# Patient Record
Sex: Female | Born: 1950
Health system: Southern US, Community
[De-identification: ages and names within clinical notes are randomized; demographics above are authoritative.]

## PROBLEM LIST (undated history)

## (undated) DIAGNOSIS — Z87898 Personal history of other specified conditions: Secondary | ICD-10-CM

## (undated) DIAGNOSIS — I509 Heart failure, unspecified: Secondary | ICD-10-CM

## (undated) DIAGNOSIS — N189 Chronic kidney disease, unspecified: Secondary | ICD-10-CM

## (undated) DIAGNOSIS — M1990 Unspecified osteoarthritis, unspecified site: Secondary | ICD-10-CM

## (undated) DIAGNOSIS — R918 Other nonspecific abnormal finding of lung field: Secondary | ICD-10-CM

## (undated) DIAGNOSIS — H9193 Unspecified hearing loss, bilateral: Secondary | ICD-10-CM

## (undated) DIAGNOSIS — I10 Essential (primary) hypertension: Secondary | ICD-10-CM

## (undated) DIAGNOSIS — M797 Fibromyalgia: Secondary | ICD-10-CM

## (undated) DIAGNOSIS — G43909 Migraine, unspecified, not intractable, without status migrainosus: Secondary | ICD-10-CM

## (undated) DIAGNOSIS — T7840XA Allergy, unspecified, initial encounter: Secondary | ICD-10-CM

## (undated) HISTORY — PX: BREAST CYST ASPIRATION: SHX578

## (undated) HISTORY — PX: OTHER SURGICAL HISTORY: SHX169

## (undated) HISTORY — DX: Heart failure, unspecified: I50.9

## (undated) HISTORY — DX: Essential (primary) hypertension: I10

## (undated) HISTORY — DX: Chronic kidney disease, unspecified: N18.9

## (undated) HISTORY — DX: Unspecified hearing loss, bilateral: H91.93

## (undated) HISTORY — DX: Allergy, unspecified, initial encounter: T78.40XA

## (undated) HISTORY — DX: Fibromyalgia: M79.7

## (undated) HISTORY — DX: Personal history of other specified conditions: Z87.898

## (undated) HISTORY — DX: Migraine, unspecified, not intractable, without status migrainosus: G43.909

## (undated) HISTORY — PX: COLONOSCOPY: SHX174

## (undated) HISTORY — PX: CATARACT EXTRACTION: SUR2

## (undated) HISTORY — DX: Other nonspecific abnormal finding of lung field: R91.8

## (undated) HISTORY — PX: TUBAL LIGATION: SHX77

## (undated) HISTORY — PX: TONSILLECTOMY: SUR1361

## (undated) HISTORY — DX: Unspecified osteoarthritis, unspecified site: M19.90

---

## 1971-06-25 HISTORY — PX: BREAST SURGERY: SHX581

## 1984-06-24 HISTORY — PX: BACK SURGERY: SHX140

## 1997-04-24 HISTORY — PX: HYSTEROSCOPY: SHX211

## 1997-09-30 ENCOUNTER — Ambulatory Visit (HOSPITAL_COMMUNITY): Admission: RE | Admit: 1997-09-30 | Discharge: 1997-09-30 | Payer: Self-pay | Admitting: Obstetrics and Gynecology

## 1997-10-13 ENCOUNTER — Other Ambulatory Visit: Admission: RE | Admit: 1997-10-13 | Discharge: 1997-10-13 | Payer: Self-pay | Admitting: Obstetrics and Gynecology

## 1997-11-11 ENCOUNTER — Ambulatory Visit (HOSPITAL_COMMUNITY): Admission: RE | Admit: 1997-11-11 | Discharge: 1997-11-11 | Payer: Self-pay | Admitting: *Deleted

## 1998-07-03 ENCOUNTER — Other Ambulatory Visit: Admission: RE | Admit: 1998-07-03 | Discharge: 1998-07-03 | Payer: Self-pay | Admitting: Obstetrics and Gynecology

## 1998-07-16 ENCOUNTER — Emergency Department (HOSPITAL_COMMUNITY): Admission: EM | Admit: 1998-07-16 | Discharge: 1998-07-16 | Payer: Self-pay | Admitting: Emergency Medicine

## 1998-12-04 ENCOUNTER — Encounter: Admission: RE | Admit: 1998-12-04 | Discharge: 1999-01-11 | Payer: Self-pay | Admitting: Neurology

## 1999-04-16 ENCOUNTER — Other Ambulatory Visit: Admission: RE | Admit: 1999-04-16 | Discharge: 1999-04-16 | Payer: Self-pay | Admitting: Obstetrics and Gynecology

## 1999-05-01 ENCOUNTER — Ambulatory Visit (HOSPITAL_COMMUNITY): Admission: RE | Admit: 1999-05-01 | Discharge: 1999-05-01 | Payer: Self-pay | Admitting: Obstetrics and Gynecology

## 1999-05-01 ENCOUNTER — Encounter: Payer: Self-pay | Admitting: Obstetrics and Gynecology

## 1999-05-03 ENCOUNTER — Ambulatory Visit (HOSPITAL_COMMUNITY): Admission: RE | Admit: 1999-05-03 | Discharge: 1999-05-03 | Payer: Self-pay | Admitting: Obstetrics and Gynecology

## 1999-05-03 ENCOUNTER — Encounter: Payer: Self-pay | Admitting: Obstetrics and Gynecology

## 2000-05-23 ENCOUNTER — Encounter: Payer: Self-pay | Admitting: Internal Medicine

## 2000-05-23 ENCOUNTER — Ambulatory Visit (HOSPITAL_COMMUNITY): Admission: RE | Admit: 2000-05-23 | Discharge: 2000-05-23 | Payer: Self-pay | Admitting: Internal Medicine

## 2000-08-25 ENCOUNTER — Other Ambulatory Visit: Admission: RE | Admit: 2000-08-25 | Discharge: 2000-08-25 | Payer: Self-pay | Admitting: Obstetrics and Gynecology

## 2001-11-06 ENCOUNTER — Encounter: Payer: Self-pay | Admitting: Obstetrics and Gynecology

## 2001-11-06 ENCOUNTER — Ambulatory Visit (HOSPITAL_COMMUNITY): Admission: RE | Admit: 2001-11-06 | Discharge: 2001-11-06 | Payer: Self-pay | Admitting: Obstetrics and Gynecology

## 2002-03-12 ENCOUNTER — Other Ambulatory Visit: Admission: RE | Admit: 2002-03-12 | Discharge: 2002-03-12 | Payer: Self-pay | Admitting: Obstetrics and Gynecology

## 2002-10-26 ENCOUNTER — Encounter: Admission: RE | Admit: 2002-10-26 | Discharge: 2002-10-26 | Payer: Self-pay | Admitting: Internal Medicine

## 2002-10-26 ENCOUNTER — Encounter: Payer: Self-pay | Admitting: Internal Medicine

## 2003-06-02 ENCOUNTER — Other Ambulatory Visit: Admission: RE | Admit: 2003-06-02 | Discharge: 2003-06-02 | Payer: Self-pay | Admitting: Obstetrics and Gynecology

## 2003-06-10 ENCOUNTER — Ambulatory Visit (HOSPITAL_COMMUNITY): Admission: RE | Admit: 2003-06-10 | Discharge: 2003-06-10 | Payer: Self-pay | Admitting: Obstetrics and Gynecology

## 2003-07-22 ENCOUNTER — Ambulatory Visit (HOSPITAL_COMMUNITY): Admission: RE | Admit: 2003-07-22 | Discharge: 2003-07-22 | Payer: Self-pay | Admitting: Internal Medicine

## 2004-07-13 ENCOUNTER — Other Ambulatory Visit: Admission: RE | Admit: 2004-07-13 | Discharge: 2004-07-13 | Payer: Self-pay | Admitting: Obstetrics and Gynecology

## 2004-08-08 ENCOUNTER — Ambulatory Visit (HOSPITAL_COMMUNITY): Admission: RE | Admit: 2004-08-08 | Discharge: 2004-08-08 | Payer: Self-pay | Admitting: Obstetrics and Gynecology

## 2005-01-22 ENCOUNTER — Inpatient Hospital Stay (HOSPITAL_COMMUNITY): Admission: EM | Admit: 2005-01-22 | Discharge: 2005-01-24 | Payer: Self-pay | Admitting: Emergency Medicine

## 2005-01-23 ENCOUNTER — Ambulatory Visit: Payer: Self-pay | Admitting: Cardiovascular Disease

## 2005-01-23 ENCOUNTER — Encounter: Payer: Self-pay | Admitting: Cardiovascular Disease

## 2005-01-23 ENCOUNTER — Ambulatory Visit: Payer: Self-pay | Admitting: Cardiology

## 2005-01-31 ENCOUNTER — Ambulatory Visit: Payer: Self-pay

## 2005-02-14 ENCOUNTER — Ambulatory Visit: Payer: Self-pay | Admitting: Cardiology

## 2005-02-28 ENCOUNTER — Ambulatory Visit: Payer: Self-pay | Admitting: Cardiology

## 2005-03-08 ENCOUNTER — Ambulatory Visit: Payer: Self-pay | Admitting: Pulmonary Disease

## 2005-03-21 ENCOUNTER — Ambulatory Visit: Payer: Self-pay | Admitting: Cardiology

## 2005-05-01 ENCOUNTER — Ambulatory Visit: Payer: Self-pay | Admitting: Cardiology

## 2005-05-06 ENCOUNTER — Ambulatory Visit (HOSPITAL_COMMUNITY): Admission: RE | Admit: 2005-05-06 | Discharge: 2005-05-06 | Payer: Self-pay | Admitting: Internal Medicine

## 2005-07-01 ENCOUNTER — Ambulatory Visit: Payer: Self-pay | Admitting: Cardiology

## 2005-09-02 ENCOUNTER — Ambulatory Visit: Payer: Self-pay | Admitting: Cardiology

## 2005-09-02 ENCOUNTER — Ambulatory Visit (HOSPITAL_COMMUNITY): Admission: RE | Admit: 2005-09-02 | Discharge: 2005-09-02 | Payer: Self-pay | Admitting: Internal Medicine

## 2005-09-02 ENCOUNTER — Ambulatory Visit: Payer: Self-pay

## 2005-09-02 ENCOUNTER — Encounter: Payer: Self-pay | Admitting: Internal Medicine

## 2005-09-19 ENCOUNTER — Ambulatory Visit: Payer: Self-pay | Admitting: Internal Medicine

## 2005-09-19 ENCOUNTER — Ambulatory Visit: Payer: Self-pay | Admitting: Cardiology

## 2005-10-01 ENCOUNTER — Ambulatory Visit: Payer: Self-pay | Admitting: Cardiology

## 2005-10-18 ENCOUNTER — Ambulatory Visit: Payer: Self-pay | Admitting: Cardiology

## 2005-10-28 ENCOUNTER — Ambulatory Visit: Payer: Self-pay | Admitting: Internal Medicine

## 2005-11-14 ENCOUNTER — Ambulatory Visit (HOSPITAL_COMMUNITY): Admission: RE | Admit: 2005-11-14 | Discharge: 2005-11-14 | Payer: Self-pay | Admitting: Internal Medicine

## 2005-11-29 ENCOUNTER — Ambulatory Visit: Payer: Self-pay | Admitting: Internal Medicine

## 2005-12-13 ENCOUNTER — Ambulatory Visit: Payer: Self-pay | Admitting: Cardiology

## 2005-12-26 ENCOUNTER — Ambulatory Visit: Payer: Self-pay | Admitting: Pulmonary Disease

## 2006-01-13 ENCOUNTER — Ambulatory Visit: Payer: Self-pay | Admitting: Internal Medicine

## 2006-01-17 ENCOUNTER — Other Ambulatory Visit: Admission: RE | Admit: 2006-01-17 | Discharge: 2006-01-17 | Payer: Self-pay | Admitting: Obstetrics & Gynecology

## 2006-02-14 ENCOUNTER — Ambulatory Visit: Payer: Self-pay

## 2006-02-14 ENCOUNTER — Encounter: Payer: Self-pay | Admitting: Cardiology

## 2006-03-03 ENCOUNTER — Ambulatory Visit (HOSPITAL_COMMUNITY): Admission: RE | Admit: 2006-03-03 | Discharge: 2006-03-03 | Payer: Self-pay | Admitting: Internal Medicine

## 2006-03-14 ENCOUNTER — Ambulatory Visit: Payer: Self-pay | Admitting: Internal Medicine

## 2006-03-17 ENCOUNTER — Ambulatory Visit: Payer: Self-pay | Admitting: Pulmonary Disease

## 2006-04-15 ENCOUNTER — Ambulatory Visit: Payer: Self-pay | Admitting: Internal Medicine

## 2006-05-08 ENCOUNTER — Ambulatory Visit (HOSPITAL_COMMUNITY): Admission: RE | Admit: 2006-05-08 | Discharge: 2006-05-08 | Payer: Self-pay | Admitting: Internal Medicine

## 2006-05-08 ENCOUNTER — Ambulatory Visit: Payer: Self-pay | Admitting: Internal Medicine

## 2006-05-30 ENCOUNTER — Ambulatory Visit: Payer: Self-pay | Admitting: Internal Medicine

## 2006-06-06 ENCOUNTER — Ambulatory Visit: Payer: Self-pay

## 2006-07-08 ENCOUNTER — Ambulatory Visit: Payer: Self-pay | Admitting: Cardiology

## 2006-09-02 ENCOUNTER — Ambulatory Visit: Payer: Self-pay | Admitting: Cardiology

## 2006-11-14 ENCOUNTER — Ambulatory Visit: Payer: Self-pay | Admitting: Internal Medicine

## 2006-12-30 ENCOUNTER — Ambulatory Visit: Payer: Self-pay | Admitting: Cardiology

## 2006-12-30 LAB — CONVERTED CEMR LAB
CO2: 35 meq/L — ABNORMAL HIGH (ref 19–32)
Calcium: 9.4 mg/dL (ref 8.4–10.5)
Glucose, Bld: 109 mg/dL — ABNORMAL HIGH (ref 70–99)
Potassium: 3.8 meq/L (ref 3.5–5.1)
Pro B Natriuretic peptide (BNP): 22 pg/mL (ref 0.0–100.0)

## 2007-02-20 ENCOUNTER — Ambulatory Visit: Payer: Self-pay | Admitting: Internal Medicine

## 2007-03-20 ENCOUNTER — Ambulatory Visit: Payer: Self-pay | Admitting: Cardiovascular Disease

## 2007-05-07 ENCOUNTER — Other Ambulatory Visit: Admission: RE | Admit: 2007-05-07 | Discharge: 2007-05-07 | Payer: Self-pay | Admitting: Obstetrics & Gynecology

## 2007-05-22 ENCOUNTER — Ambulatory Visit (HOSPITAL_COMMUNITY): Admission: RE | Admit: 2007-05-22 | Discharge: 2007-05-22 | Payer: Self-pay | Admitting: Obstetrics & Gynecology

## 2007-08-10 ENCOUNTER — Ambulatory Visit: Payer: Self-pay | Admitting: Cardiology

## 2007-10-09 ENCOUNTER — Ambulatory Visit: Payer: Self-pay

## 2007-10-09 ENCOUNTER — Encounter: Payer: Self-pay | Admitting: Internal Medicine

## 2007-10-09 ENCOUNTER — Ambulatory Visit: Payer: Self-pay | Admitting: Internal Medicine

## 2008-01-19 ENCOUNTER — Encounter: Payer: Self-pay | Admitting: Internal Medicine

## 2008-01-19 ENCOUNTER — Ambulatory Visit: Payer: Self-pay

## 2008-01-19 ENCOUNTER — Ambulatory Visit: Payer: Self-pay | Admitting: Internal Medicine

## 2008-07-22 ENCOUNTER — Ambulatory Visit (HOSPITAL_COMMUNITY): Admission: RE | Admit: 2008-07-22 | Discharge: 2008-07-22 | Payer: Self-pay | Admitting: Internal Medicine

## 2008-08-11 ENCOUNTER — Ambulatory Visit: Payer: Self-pay | Admitting: Internal Medicine

## 2008-09-02 ENCOUNTER — Ambulatory Visit: Payer: Self-pay

## 2008-09-02 ENCOUNTER — Ambulatory Visit: Payer: Self-pay | Admitting: Internal Medicine

## 2008-09-02 ENCOUNTER — Encounter: Payer: Self-pay | Admitting: Internal Medicine

## 2008-10-21 ENCOUNTER — Encounter: Payer: Self-pay | Admitting: Internal Medicine

## 2008-10-21 LAB — CONVERTED CEMR LAB
BUN: 15 mg/dL
Creatinine, Ser: 0.8 mg/dL
HDL: 49 mg/dL
LDL Cholesterol: 207 mg/dL

## 2008-11-23 ENCOUNTER — Encounter: Payer: Self-pay | Admitting: Internal Medicine

## 2009-04-05 ENCOUNTER — Encounter (INDEPENDENT_AMBULATORY_CARE_PROVIDER_SITE_OTHER): Payer: Self-pay | Admitting: *Deleted

## 2009-07-07 ENCOUNTER — Ambulatory Visit: Payer: Self-pay | Admitting: Internal Medicine

## 2009-07-07 DIAGNOSIS — R5383 Other fatigue: Secondary | ICD-10-CM

## 2009-07-07 DIAGNOSIS — R5381 Other malaise: Secondary | ICD-10-CM | POA: Insufficient documentation

## 2009-07-07 DIAGNOSIS — I429 Cardiomyopathy, unspecified: Secondary | ICD-10-CM

## 2010-01-12 ENCOUNTER — Ambulatory Visit (HOSPITAL_COMMUNITY): Admission: RE | Admit: 2010-01-12 | Discharge: 2010-01-12 | Payer: Self-pay | Admitting: Internal Medicine

## 2010-03-09 ENCOUNTER — Ambulatory Visit: Payer: Self-pay | Admitting: Internal Medicine

## 2010-03-09 DIAGNOSIS — I10 Essential (primary) hypertension: Secondary | ICD-10-CM

## 2010-04-06 ENCOUNTER — Ambulatory Visit: Payer: Self-pay | Admitting: Cardiology

## 2010-04-06 ENCOUNTER — Ambulatory Visit (HOSPITAL_COMMUNITY): Admission: RE | Admit: 2010-04-06 | Discharge: 2010-04-06 | Payer: Self-pay | Admitting: Internal Medicine

## 2010-04-06 ENCOUNTER — Encounter: Payer: Self-pay | Admitting: Internal Medicine

## 2010-04-06 ENCOUNTER — Ambulatory Visit: Payer: Self-pay

## 2010-04-13 ENCOUNTER — Telehealth: Payer: Self-pay | Admitting: Internal Medicine

## 2010-07-24 NOTE — Progress Notes (Signed)
Summary: pt rtn call for results   Phone Note Call from Patient Call back at Work Phone [phone]   Caller: Patient Reason for Call: Talk to Nurse, Talk to Doctor, Lab or Test Results Summary of Call: rtn call to get test results Initial call taken by: Omer Jack,  April 13, 2010 1:39 PM  Follow-up for Phone Call        pt given test results Meredith Staggers, RN  April 13, 2010 3:19 PM

## 2010-07-24 NOTE — Assessment & Plan Note (Signed)
Summary: f44m  Medications Added PRAVASTATIN SODIUM 20 MG TABS (PRAVASTATIN SODIUM) Take one tablet by mouth daily at bedtime        Visit Type:  Follow-up Primary Provider:  Waynard Edwards, MD   History of Present Illness: [name] [name] is a very pleasant 60 year old woman with a history of hypertension, ongoing tobacco use, fibromyalgia, hearing loss, nonischemic cardiomyopathy with a previous ejection fraction of 25%-35%; however, this has recovered and most recent ejection fraction by ech 09/2007 was 60%.  She returns today for routine followup.   She says she has been doing pretty well. Continues to bowl regularly and work full time without any dyspnea.  She has not had any orthopnea and no PND.  She has been very compliant with her heart failure medications. Occasional very mild edema and will take an extra lasix tablet.   Current Medications (verified): 1)  Ramipril 10 Mg Caps (Ramipril) .... Take One Capsule By Mouth Two Times A Day 2)  Amlodipine Besylate 5 Mg Tabs (Amlodipine Besylate) .... Take One Tablet By Mouth Daily 3)  Furosemide 20 Mg Tabs (Furosemide) .... Take 1/2 Tablet By Mouth Daily and As Needed 4)  Carvedilol 25 Mg Tabs (Carvedilol) .... Take One Tablet By Mouth Twice A Day 5)  Aspirin 81 Mg Tbec (Aspirin) .... Take One Tablet By Mouth Daily 6)  Gabapentin 300 Mg Caps (Gabapentin) .... Once Daily 7)  Potassium Chloride Crys Cr 20 Meq Cr-Tabs (Potassium Chloride Crys Cr) .... Take One Tablet By Mouth Every Other Day 8)  Flexeril 10 Mg Tabs (Cyclobenzaprine Hcl) .... Once Daily 9)  Ambien 5 Mg Tabs (Zolpidem Tartrate) .... As Needed 10)  Imitrex 25 Mg Tabs (Sumatriptan Succinate) .... As Needed 11)  Hydrocodone-Acetaminophen 5-500 Mg Tabs (Hydrocodone-Acetaminophen) .... As Needed 12)  Phenergan 25 Mg/ml Soln (Promethazine Hcl) .... As Needed 13)  Boniva 150 Mg Tabs (Ibandronate Sodium) .... Monthly 14)  Calcium Carbonate-Vitamin D 600-400 Mg-Unit  Tabs (Calcium Carbonate-Vitamin D)  .... Two Times A Day 15)  Magonate 1000 (54 Mg) Mg/72ml Liqd (Magnesium Gluconate) .... Two Times A Day 16)  Co Q-10 30 Mg  Caps (Coenzyme Q10) .... 100mg  Two Times A Day 17)  Vitamin B-1 250 Mg  Tabs (Thiamine Hcl) .... Once Daily 18)  Vitamin E 600 Unit  Caps (Vitamin E) .... Once Daily 19)  Manganese Gluconate 50 Mg Tabs (Manganese Gluconate) .... Once Daily 20)  Vitamin D 1000 Unit  Tabs (Cholecalciferol) .... Once Daily 21)  Fish Oil   Oil (Fish Oil) .... 2000mg  Two Times A Day 22)  Silymarin  Caps (Milk Thistle-Turmeric) .... Once Daily 23)  Multivitamins   Tabs (Multiple Vitamin) .... Once Daily 24)  Pravastatin Sodium 20 Mg Tabs (Pravastatin Sodium) .... Take One Tablet By Mouth Daily At Bedtime  Allergies: 1)  ! Pcn 2)  ! Lexapro  Past History:  Past Medical History: 1. Congestive heart failure secondary to nonischemic cardiomyopathy       status post cardiac catheterization in 2006 showed normal       coronaries with EF 20%.      --echo 4/09: EF 60% 2. Hypertension, this is followed by Dr. Waynard Edwards 3. Fibromyalgia 4. Chronic migraines 5. left upper lobe nodular lesion followed by Dr. Shelle Iron 6. Hearing loss which is hereditary 7. Post menopausal since age 84 8. Heart murmur first diagnosed in November 2001 9. Osteopenia 10. Fibromyalgia in April 2004  Review of Systems       As per HPI and past  medical history; otherwise all systems negative.   Vital Signs:  Patient profile:   60 year old female Height:      60 inches Weight:      120 pounds Pulse rate:   74 / minute BP sitting:   122 / 70  (left arm)  Vitals Entered By: Laurance Flatten CMA (March 09, 2010 10:02 AM)  Physical Exam  General:  Well appearing. no resp difficulty HEENT: normal Neck: supple. no JVD. Carotids 2+ bilat; no bruits. No lymphadenopathy or thryomegaly appreciated. Cor: PMI nondisplaced. Regular rate & rhythm. No rubs, gallops, murmur. Lungs: clear Abdomen: soft, nontender,  nondistended. Good bowel sounds. Extremities: no cyanosis, clubbing, rash, edema Neuro: alert & orientedx3, cranial nerves grossly intact. moves all 4 extremities w/o difficulty. affect pleasant    Impression & Recommendations:  Problem # 1:  CARDIOMYOPATHY, PRIMARY, DILATED (ICD-425.4) Doing well. EF recovered. Given HTN will continue medicine regimen as is. Check echo to ensure stability of LV function. Retunr in 1 year.   Problem # 2:  HYPERTENSION, BENIGN (ICD-401.1) Blood pressure well controlled. Continue current regimen.  Other Orders: EKG w/ Interpretation (93000) Echocardiogram (Echo)  Patient Instructions: 1)  Your physician recommends that you continue on your current medications as directed. Please refer to the Current Medication list given to you today. 2)  Your physician wants you to follow-up in: 1 year.  You will receive a reminder letter in the mail two months in advance. If you don't receive a letter, please call our office to schedule the follow-up appointment. 3)  Your physician has requested that you have an echocardiogram in 1 month.  Echocardiography is a painless test that uses sound waves to create images of your heart. It provides your doctor with information about the size and shape of your heart and how well your heart's chambers and valves are working.  This procedure takes approximately one hour. There are no restrictions for this procedure.

## 2010-07-24 NOTE — Assessment & Plan Note (Signed)
Summary: ROV  Medications Added FUROSEMIDE 20 MG TABS (FUROSEMIDE) Take 1/2 tablet by mouth daily and as needed ASPIRIN 81 MG TBEC (ASPIRIN) Take one tablet by mouth daily GABAPENTIN 300 MG CAPS (GABAPENTIN) once daily POTASSIUM CHLORIDE CRYS CR 20 MEQ CR-TABS (POTASSIUM CHLORIDE CRYS CR) Take one tablet by mouth every other day FLEXERIL 10 MG TABS (CYCLOBENZAPRINE HCL) once daily AMBIEN 5 MG TABS (ZOLPIDEM TARTRATE) as needed IMITREX 25 MG TABS (SUMATRIPTAN SUCCINATE) as needed HYDROCODONE-ACETAMINOPHEN 5-500 MG TABS (HYDROCODONE-ACETAMINOPHEN) as needed PHENERGAN 25 MG/ML SOLN (PROMETHAZINE HCL) as needed BONIVA 150 MG TABS (IBANDRONATE SODIUM) monthly CALCIUM CARBONATE-VITAMIN D 600-400 MG-UNIT  TABS (CALCIUM CARBONATE-VITAMIN D) two times a day MAGONATE 1000 (54 MG) MG/5ML LIQD (MAGNESIUM GLUCONATE) two times a day CO Q-10 30 MG  CAPS (COENZYME Q10) 100mg  two times a day VITAMIN B-1 250 MG  TABS (THIAMINE HCL) once daily VITAMIN E 600 UNIT  CAPS (VITAMIN E) once daily MANGANESE GLUCONATE 50 MG TABS (MANGANESE GLUCONATE) once daily VITAMIN D 1000 UNIT  TABS (CHOLECALCIFEROL) once daily FISH OIL   OIL (FISH OIL) 2000mg  two times a day SILYMARIN  CAPS (MILK THISTLE-TURMERIC) once daily MULTIVITAMINS   TABS (MULTIPLE VITAMIN) once daily      Allergies Added: ! PCN ! LEXAPRO  CC:  rov .  History of Present Illness: Corry is a very pleasant 60 year old woman with a history of hypertension, ongoing tobacco use, fibromyalgia, hearing loss, nonischemic cardiomyopathy with a previous ejection fraction of 25%-35%; however, this has recovered and most recent ejection fraction several months ago was 55%.  She returns today for routine followup.   She says she has been doing pretty. Still has a lot of fatigue and wonders if it is due to her midications. However she is able to do all her activities without much difficulty. Continues to bowl regularly and work full time without any dyspnea.  She  has not had any orthopnea and no PND.  She has been very compliant with her heart failure medications.  Current Medications (verified): 1)  Ramipril 10 Mg Caps (Ramipril) .... Take One Capsule By Mouth Two Times A Day 2)  Amlodipine Besylate 5 Mg Tabs (Amlodipine Besylate) .... Take One Tablet By Mouth Daily 3)  Furosemide 20 Mg Tabs (Furosemide) .... Take 1/2 Tablet By Mouth Daily and As Needed 4)  Carvedilol 25 Mg Tabs (Carvedilol) .... Take One Tablet By Mouth Twice A Day 5)  Aspirin 81 Mg Tbec (Aspirin) .... Take One Tablet By Mouth Daily 6)  Gabapentin 300 Mg Caps (Gabapentin) .... Once Daily 7)  Potassium Chloride Crys Cr 20 Meq Cr-Tabs (Potassium Chloride Crys Cr) .... Take One Tablet By Mouth Every Other Day 8)  Flexeril 10 Mg Tabs (Cyclobenzaprine Hcl) .... Once Daily 9)  Ambien 5 Mg Tabs (Zolpidem Tartrate) .... As Needed 10)  Imitrex 25 Mg Tabs (Sumatriptan Succinate) .... As Needed 11)  Hydrocodone-Acetaminophen 5-500 Mg Tabs (Hydrocodone-Acetaminophen) .... As Needed 12)  Phenergan 25 Mg/ml Soln (Promethazine Hcl) .... As Needed 13)  Boniva 150 Mg Tabs (Ibandronate Sodium) .... Monthly 14)  Calcium Carbonate-Vitamin D 600-400 Mg-Unit  Tabs (Calcium Carbonate-Vitamin D) .... Two Times A Day 15)  Magonate 1000 (54 Mg) Mg/70ml Liqd (Magnesium Gluconate) .... Two Times A Day 16)  Co Q-10 30 Mg  Caps (Coenzyme Q10) .... 100mg  Two Times A Day 17)  Vitamin B-1 250 Mg  Tabs (Thiamine Hcl) .... Once Daily 18)  Vitamin E 600 Unit  Caps (Vitamin E) .... Once Daily 19)  Manganese Gluconate 50 Mg Tabs (Manganese Gluconate) .... Once Daily 20)  Vitamin D 1000 Unit  Tabs (Cholecalciferol) .... Once Daily 21)  Fish Oil   Oil (Fish Oil) .... 2000mg  Two Times A Day 22)  Silymarin  Caps (Milk Thistle-Turmeric) .... Once Daily 23)  Multivitamins   Tabs (Multiple Vitamin) .... Once Daily  Allergies (verified): 1)  ! Pcn 2)  ! Lexapro  Past History:  Past Medical History: Last updated:  08/05/2008 1. Congestive heart failure secondary to nonischemic cardiomyopathy       status post cardiac catheterization in 2006 showed normal       coronaries with EF 20%.  2. Hypertension, this is followed by Dr. Waynard Edwards 3. Fibromyalgia 4. Chronic migraines 5. left upper lobe nodular lesion followed by Dr. Shelle Iron 6. Hearing loss which is hereditary 7. Post menopausal since age 30 8. Heart murmur first diagnosed in November 2001 9. Osteopenia 10. Fibromyalgia in April 2004  Review of Systems       As per HPI and past medical history; otherwise all systems negative.   Vital Signs:  Patient profile:   60 year old female Height:      60 inches Weight:      125 pounds BMI:     24.50 Pulse rate:   66 / minute BP sitting:   122 / 76  (right arm) Cuff size:   regular  Vitals Entered By: Hardin Negus, RMA (July 07, 2009 9:11 AM)  Physical Exam  General:  Gen: well appearing. no resp difficulty HEENT: normal Neck: supple. no JVD. Carotids 2+ bilat; no bruits. No lymphadenopathy or thryomegaly appreciated. Cor: PMI nondisplaced. Regular rate & rhythm. No rubs, gallops, murmur. Lungs: clear Abdomen: soft, nontender, nondistended. Good bowel sounds. Extremities: no cyanosis, clubbing, rash, edema Neuro: alert & orientedx3, cranial nerves grossly intact. moves all 4 extremities w/o difficulty. affect pleasant    Impression & Recommendations:  Problem # 1:  CARDIOMYOPATHY, PRIMARY, DILATED (ICD-425.4) EF has normalized. No signs of HF. Volume status looks good. Doubt fatigue is due to HF but I have agreed to a trial od cutting her carvedilol back to 12.5 two times a day and see if this helps. Warned her not to go any lower than this. And also discussed possibility of recurrent LV dysfunction, though I think this risk is low.  Problem # 2:  FATIGUE / MALAISE (ICD-780.79) Likely multifactorial. May be due to other meds including narcotics and flexeril. As above, OK for trial of  decrreasing carvedilol.   Patient Instructions: 1)  Follow up in 6 months  Appended Document: ROV ECG with SR 66. TWI v1 and v2. with mild diffuse ST scooping

## 2010-11-06 NOTE — Assessment & Plan Note (Signed)
[facility]                          CHRONIC HEART FAILURE NOTE   NAME:[name] [name], [name] [name]                          MRN:          [id number]  DATE:08/10/2007                            DOB:          May 22, 1951    PRIMARY CARDIOLOGIST:  Dr. Nicholes Mango.   PRIMARY CARE:  Dr. Loraine Leriche A. Perini.   [name] [name] returns today for follow-up of her congestive heart failure which  is secondary to idiopathic nonischemic cardiomyopathy.  Most recent  echocardiogram done in 2007 showed EF of 25-35%.  Status post T-wave  alternans study since that time showed low probability.  Dr. Graciela Husbands  recommend annual T-wave testing at that time.  [name] [name] states she has been  doing quite well.  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  She denies any episodes of chest  discomfort, lightheadedness, dizziness, presyncope or syncopal episodes,  palpitations.  She states her husband just returned from the beach for  vacation.  She does states she occasionally forgets to take her Norvasc,  especially on the weekends.  However, she manages to state multiple  vitamin supplements regularly.  Today she is inquiring as to whether or  not she can try a turmeric spice in capsule form to help treat her  arthritis pain.   PAST MEDICAL HISTORY:  1. Congestive heart failure secondary to nonischemic cardiomyopathy      status post cardiac catheterization in 2006 showed normal      coronaries with EF 20%.  2. Status post T-wave alternans study which was negative in 2007.  3. Hypertension.  4. Ongoing tobacco use.  5. Fibromyalgia.  6. Chronic migraines.  7. History of back surgery.  8. Previous left upper lobe nodular lesion followed by Dr. Shelle Iron.      Likely benign given stable appearance in 2006 with recommendations      for 2-year follow-up.  9. Hearing loss which is hereditary.   REVIEW OF SYSTEMS:  As stated above, otherwise negative.   CURRENT  ALLERGIES:  1. Include SULFA DRUGS.  2. LEXAPRO which causes myalgias.  3. FOSAMAX causes GI upset.  4. PAXIL causes myalgias.  5. NSAID cause GI upset.  6. IMIPRAMINE increased nervousness.  7. STATINS myalgias.   CURRENT MEDICATIONS:  Include to acute Co-Q-10 b.i.d. vitamin E daily,  Lasix 10 mg daily.  Gabapentin 300 mg daily, vitamin D 1000 mg daily,  Flexeril 10 mg daily, calcium 600 mg b.i.d., magnesium malate 1000 mg  b.i.d., manganese 50 mg daily, B1 100 mg daily, K-Dur 20 mEq every other  day, aspirin 81 mg daily, Coreg 25 b.i.d., Altace 10 mg b.i.d.,  amlodipine 5 mg daily, Imitrex p.r.n., Zocor, the patient discontinued  this medication secondary to muscle fatigue.  Other p.r.n. medications  include Ambien, Benadryl, Vicodin, Phenergan and Alavert.   PHYSICAL EXAM:  Weight 125, blood pressure 118/79 with a heart rate of  78.  Rick is in no acute distress.  No signs of jugular vein distention at 45 degrees angle.  LUNGS:  Clear to auscultation bilaterally.  Cardiovascular  exam reveals S1-S2 regular rate and rhythm.  ABDOMEN:  Soft, nontender, positive bowel sounds.  LOWER EXTREMITIES:  Without clubbing, cyanosis or edema.  Neurologically alert and oriented x3.   IMPRESSION:  1. Congestive heart failure secondary to nonischemic cardiomyopathy      with EF recently 25-35% by echocardiogram in 2007 with a negative T-      wave alternans test.  The patient is due for an echocardiogram.      Will arrange this.  She declined blood work today states she has a      physical exam with Dr. Waynard Edwards in April and will have her blood work      drawn at that time.  Will ask that copy be faxed to me.  2. Smoking cessation once again encouraged.  Will have Oneda follow-up      with Dr. Gala Romney for routine cardiology visit after      echocardiogram obtained.  3. In regards to the turmeric capsule, I have spoken with Dr Shelby Dubin.  Currently no contraindications to use with heart  failure      medications but no data to support its use either with arthritis.      Dorian Pod, ACNP  Electronically Signed      Rollene Rotunda, MD, [facility]  Electronically Signed   MB/MedQ  DD: 08/10/2007  DT: 08/10/2007  Job #: [id number]   cc:   Loraine Leriche A. Perini, M.D.

## 2010-11-06 NOTE — Assessment & Plan Note (Signed)
[name]                            CARDIOLOGY OFFICE NOTE   NAME:Orwick, DAPHNE KARRER                          MRN:          [id number]  DATE:01/19/2008                            DOB:          05-11-1951    PRIMARY CARE PHYSICIAN:  Loraine Leriche A. Perini, MD   INTERVAL HISTORY:  Adelin is a very pleasant 60 year old woman with a  history of hypertension, ongoing tobacco use, fibromyalgia, hearing  loss, nonischemic cardiomyopathy with a previous ejection fraction of  25%-35%; however, this has recovered and most recent ejection fraction  several months ago was 55%.  She returns today for routine followup.   She says she has been feeling great.  She has taken to walking for 30  minutes 3-4 times a week without any dyspnea.  She has not had any  orthopnea and no PND.  She has been very compliant with her heart  failure medications, but she has been less compliant with her  amlodipine.  She says she often forgets to take this.   CURRENT MEDICATIONS:  1. Aspirin 81 a day.  2. Altace 10 b.i.d.  3. Coreg 25 b.i.d.  4. Gabapentin 300 a day.  5. Amlodipine 5 mg, which she takes occasionally.  6. Potassium 20 every other day.  7. Lasix 10 mg daily.  8. Flexeril 10 a day.  9. Crestor 10 mg 3 times a week.  10.Calcium.  11.Magnesium.  12.Coenzyme Q10.  13.Vitamin B1.  14.Vitamin E.  15.Manganese.  16.Vitamin D.  17.Fish oil.  18.Ambien p.r.n.  19.Imitrex p.r.n.   PHYSICAL EXAM:  GENERAL:  She is well-appearing in no acute distress.  Ambulates around the clinic without any  respiratory difficulty.  VITAL SIGNS:  Blood pressure is 154/74, heart rate 73, weight is 124,  which is stable.  HEENT:  Normal.  Neck is supple.  No JVD.  Carotid 2+ bilaterally, no  bruits.  There is no lymphadenopathy or thyromegaly.  CARDIAC:  PMI is nondisplaced.  Regular rate and rhythm.  No murmurs,  rubs, or gallops.  LUNGS:  Clear.  ABDOMEN:  Soft, nontender, and nondistended.   No hepatosplenomegaly.  No  bruits, no masses.  Good bowel sounds.  EXTREMITIES:  Warmth.  No sinus, clubbing, or edema.  No rash.  NEURO:  Alert and oriented x3.  Cranial nerves II through XII are intact  except for her hearing.  Moves all four extremities without difficulty.  Affect was mildly flattened.   EKG shows normal sinus rhythm with mildly flattened T-waves diffusely.  No significant ST-T wave abnormalities.   ASSESSMENT AND PLAN:  1. History of nonischemic cardiomyopathy.  Her ejection fraction is      recovered.  We will repeat her echocardiogram today just to make      sure this is stable.  She will continue her current medications.  2. Hypertension, this is followed by Dr. Waynard Edwards.  I have encouraged      her to be more compliant with her Norvasc.   DISPOSITION:  I will see her back for routine follow up in  6 months.     Bevelyn Buckles. Bensimhon, MD  Electronically Signed    DRB/MedQ  DD: 01/19/2008  DT: 01/20/2008  Job #: [id number]   cc:   Loraine Leriche A. Perini, M.D.

## 2010-11-06 NOTE — Assessment & Plan Note (Signed)
[facility]                          CHRONIC HEART FAILURE NOTE   NAME:[name] [name], [name] [name]                          MRN:          [id number]  DATE:11/14/2006                            DOB:          10/30/1950    PRIMARY CARE PHYSICIAN:  Dr. Rodrigo Ran.   PRIMARY CARDIOLOGIST:  Dr. Nicholes Mango.   PULMONARY:  Dr. Shelle Iron.   [name] [name] returns today for a followup of her congestive heart failure  which is secondary to idiopathic nonischemic cardiomyopathy, most recent  echocardiogram done in August of 2007 showed an EF of 25%-35%, status  post T-wave alternans test which was negative.  Dr. Graciela Husbands recommended  annual T-wave testing.  [name] [name] states she has been doing quite well.  Complains of intermittent dizziness.  No signs of volume overload.  Continues to work full time.  States her blood pressure has been running  130s over 70s at home.  Still taking Norvasc 5 mg daily.  I have  requested that patient increase it to 10 mg in the past, but patient  declines.  Currently is taking her Lasix 10 mg every other day secondary  to feelings of fatigue and of being washed out.   PAST MEDICAL HISTORY:  1. Includes congestive heart failure secondary to nonischemic      cardiomyopathy, status post cardiac cath in 2006 showed normal      coronaries with an EF of 20%.  2. Hypertension.  3. Ongoing tobacco use.  4. Fibromyalgia.  5. Chronic migraines.  6. Lung nodule followed by Dr. Shelle Iron.   REVIEW OF SYSTEMS:  As stated above.   ALLERGIES:  Include SULFA, LEXAPRO, FOSAMAX, PAXIL, NSAID and  IMIPRAMINE.   CURRENT MEDICATIONS:  1. Flexeril 10 mg daily.  2. Calcium.  3. Vitamin D.  4. Magnesium.  5. Manganese.  6. B1.  7. Furosemide 10 mg every other day.  8. Amlodipine 5 mg daily.  9. Imitrex p.r.n.  10.Zocor 20 mg daily.  11.Gabapentin 100 mg in the morning and 200 at bedtime.  12.Altace 10 mg daily.  13.Coreg 25 b.i.d.  14.Aspirin 81.  15.K-Dur 20  mEq with Lasix.   PHYSICAL EXAMINATION:  Weight 122 pounds, blood pressure 142/80,  rechecked 130/84 with a heart rate of 78.  [name] [name] is in no acute distress.  JVD 8-10 cm at a 45 degree angle.  LUNGS:  Clear to auscultation.  CARDIOVASCULAR EXAM:  Reveals an S1 and S2, regular rate and rhythm.  ABDOMEN:  Soft, nontender, positive bowel sounds.  LOWER EXTREMITIES:  Without cyanosis, clubbing, or edema.  Neurologically alert and oriented x3.   IMPRESSION:  Stable class II heart failure symptoms at this time.  The  patient does have some mild jugular venous distension, would like to  have her change her furosemide to 10 mg daily.  Otherwise, continue  current medications.  See patient back in 6-8 weeks, sooner if any  problems.      Dorian Pod, ACNP  Electronically Signed      Bevelyn Buckles. Bensimhon, MD  Electronically Signed   MB/MedQ  DD: 11/14/2006  DT: 11/14/2006  Job #: [id number]   cc:   Loraine Leriche A. Perini, M.D.

## 2010-11-06 NOTE — Assessment & Plan Note (Signed)
[facility]                          CHRONIC HEART FAILURE NOTE   NAME:[name] [name], [name] [name]                          MRN:          [id number]  DATE:02/20/2007                            DOB:          [birth year]    [name] [name] returns today for follow up of her congestive heart failure  which is secondary to etiopathic non-ischemic cardiomyopathy. Her most  recent echocardiogram in 2007 showed a EF of 25-35% status post T-wave  alternance test which was negative. Dr. Graciela Husbands recommended annual T-wave  testing. [name] states she has been doing quite well. 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 She  denies any episodes of chest discomfort, light headedness, dizziness,  pre-syncopal, or syncopal episodes. At her last visit, I decreased her  Lasix to 10 mg daily. She denies any symptoms of volume overload.   PAST MEDICAL HISTORY:  1. Congestive heart failure secondary to non-specific cardiomyopathy      status post cardiac catheterization in 2006 showing normal      coronaries with a EF of 20%.  2. Status post T-wave alternance test which was negative in 2007.  3. Hypertension.  4. Ongoing tobacco use.  5. Fibromyalgia.  6. Chronic migraines.  7. History of back surgery in 1986.  8. Previous left upper lobe nodular lesion followed by Dr. Shelle Iron,      likely benign given its stable appearance since 2006 with      recommendations for a two year follow up.   REVIEW OF SYSTEMS:  As stated above.   ALLERGIES:  SULFA, LEXAPRO, FOSAMAX, PAXIL, NSAIDs, and IMIPRAMINE.   CURRENT MEDICATIONS:  1. Flexeril 10 mg daily.  2. Calcium and vitamin D b.i.d.  3. Magnesium daily.  4. Manganese daily.  5. CoQ 10 daily.  6. Vitamin E daily.  7. B1 daily.  8. K-Dur 20 mEq p.r.n. with Lasix.  9. Aspirin 81 mg daily.  10.Coreg 25 mg b.i.d.  11.Altace 10 mg b.i.d.  12.Gabapentin 100 mg in the a.m. 300 mg in the p.m.  13.Amlodipine 5 mg  daily.  14.Imitrex p.r.n.  15.Furosemide 10 mg daily.  16.Ambien p.r.n.  17.Benadryl p.r.n.  18.Hydrocodone p.r.n.  19.Phenergan p.r.n.  20.Alavert p.r.n.   PHYSICAL EXAMINATION:  VITAL SIGNS:  Blood pressure 138/62, manual  122/72, weight 126, __________ 82.  GENERAL:  [name] is in no acute distress. She is her usual pleasant  self.  HEENT:  Unremarkable.  NECK:  Supple without lymphadenopathy. No bruits and no JVD.  LUNGS:  Clear to auscultation.  CARDIOVASCULAR:  S1, S2. Regular rate and rhythm.  ABDOMEN:  Soft and nontender, positive bowel sounds.  LOWER EXTREMITIES:  Without cyanosis, clubbing, or edema.  NEUROLOGIC:  Awake, alert, and oriented x3. Cranial nerves II-XII are  grossly intact.   IMPRESSION:  Congestive heart failure secondary to non-ischemic  cardiomyopathy with EF currently 25% to 35% status post T-wave  alternance test which was negative. The patient will be due for a repeat  T-wave test within the next few months. The patient  would like to be  called at home to arrange around her schedule and will continue current  medications. Blood pressure is well controlled. Once again, complete  smoking cessation encouraged. In review of lab work obtained in July,  potassium 3.8, BUN 12, and creatinine 0.9. Glucose is mildly elevated at  109, BNP 22. I instructed [name] [name] to follow up with Rodrigo Ran for further  evaluation of elevated glucose.      Dorian Pod, ACNP  Electronically Signed      Bevelyn Buckles. Bensimhon, MD  Electronically Signed   MB/MedQ  DD: 02/20/2007  DT: 02/22/2007  Job #: [id number]   cc:   Loraine Leriche A. Perini, M.D.

## 2010-11-06 NOTE — Assessment & Plan Note (Signed)
[facility]                          CHRONIC HEART FAILURE NOTE   NAME:[name] [name], [name] [name]                          MRN:          [id number]  DATE:12/30/2006                            DOB:          June 10, 1951    [name] [name] returns today for followup of her congestive heart failure which is  secondary to idiopathic nonischemic cardiomyopathy.  Most recent  echocardiogram in August 2007 showed an EF of 25-35%, status post T-wave  opportunist test which was negative.  Dr. Graciela Husbands recommending annual T-  wave testing.  We will arrange this at followup in August.  [name] [name] states  she has been doing quite well, denying any episodes of lightheadedness  or dizziness.  Medications without problems.  She has had some episodes  of atypical chest pain with some increased belching and reflux,  otherwise has done quite well.  I decreased her Lasix to 10 mg daily  recently.  She has not had any peripheral edema, orthopnea, or PND.   PAST MEDICAL HISTORY:  1. Congestive heart failure secondary to nonspecific cardiomyopathy      status post cardiac catheterization in 2006 showing normal      coronaries with a EF of 20%.  2. Hypertension.  3. Ongoing tobacco use.  4. Fibromyalgia.  5. Chronic migraines.   REVIEW OF SYSTEMS:  As stated above.   CURRENT MEDICATIONS:  1. Co-Q-10 b.i.d.  2. Vitamin E daily.  3. Flexeril.  4. Calcium.  5. Magnesium.  6. Manganese.  7. B1.  8. K-Dur 20 mEq every other day with Lasix.  9. Aspirin 81 mg daily.  10.Carvedilol 25 mg b.i.d.  11.Altace 10 mg b.i.d.  12.Gabapentin 1000 mg in a.m. 300 mg in p.m.  13.Amlodipine 5 mg daily.  14.Imitrex p.r.n.  15.Furosemide 10 mg every other day.  16.Simvastatin 20 mg daily.   PHYSICAL EXAMINATION:  VITAL SIGNS:  Weight 125, blood pressure 126/72  with a pulse of 79.  GENERAL:  [name] [name] is in no acute distress today.  HEENT:  Unremarkable, no JVD at 45-degree angle.  LUNGS:  Clear to auscultation.  CARDIOVASCULAR:  S1, S2, regular rate and rhythm.  ABDOMEN:  Soft, nontender, positive bowel sounds.  EXTREMITIES:  Lower extremities without cyanosis, clubbing, or edema.  NEUROLOGIC:  Alert and oriented x3.   IMPRESSION:  Stable class II heart failure symptoms at this time.  No  peripheral edema.  We will continue current medications.  We will check  lab work at this time, as she has not had any drawn here in a few months  and have the patient follow up in 2 months' time.  We will arrange for  repeat T-wave alternans test at that visit.      Dorian Pod, ACNP  Electronically Signed      Rollene Rotunda, MD, [facility]  Electronically Signed   MB/MedQ  DD: 12/30/2006  DT: 12/30/2006  Job #: [id number]   cc:   Loraine Leriche A. Perini, M.D.

## 2010-11-06 NOTE — Assessment & Plan Note (Signed)
[facility]                            CARDIOLOGY OFFICE NOTE   NAME:[name], [name] DEJARNETT                          MRN:          [id number]  DATE:08/11/2008                            DOB:          08/14/1950    PRIMARY CARE PHYSICIAN:  Loraine Leriche A. Perini, MD   INTERVAL HISTORY:  [name] [name] is a 60 year old woman with a history of  hypertension, fibromyalgia, hearing loss, nonischemic cardiomyopathy  with previous ejection fraction of 25-35% which has recovered and now on  most recent echocardiogram in July 2009, shows an EF of 50-55%.   She returns today for routine followup.  She is having a very difficult  time as her 32 year old mother has been sick in the hospital and now in  a nursing home with a venous ulcer and other complications.  She says  that this has been very stressful for her; however, otherwise she is  doing fairly well.  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  She denies any chest pain or dyspnea.  No orthopnea,  no PND.  No lower extremity edema.  She has been compliant with all her  medications.  She is wondering if she can get also off her medications.   CURRENT MEDICATIONS:  1. Aspirin 81 a day.  2. Altace 10 b.i.d.  3. Coreg 25 b.i.d.  4. Neurontin 300 a day.  5. Amlodipine 5 a day.  6. Potassium 20 every other day.  7. Lasix 10 mg daily.  8. Flexeril 100 a day.  9. Calcium.  10.Magnesium.  11.Coenzyme Q10.  12.Vitamin B1, vitamin E, vitamin D.  13.Imitrex p.r.n.  14.Ambien p.r.n.  15.Hydrocodone p.r.n.  16.Phenergan p.r.n.   PHYSICAL EXAMINATION:  GENERAL:  She is well-appearing in no acute  distress, ambulates around the clinic without any respiratory  difficulty.  VITAL SIGNS:  Blood pressure initially was 98/62, when I checked it  manually 148/70, heart rate is 80, weight is 122, which is stable.  HEENT:  Normal.  NECK:  Supple.  No JVD.  Carotids are 2+ bilaterally with no bruits.  There is no  lymphadenopathy or thyromegaly.  CARDIAC:  PMI is nondisplaced.  Regular rate and rhythm.  No murmurs,  rubs, or gallops.  LUNGS:  Clear.  ABDOMEN:  Soft, nontender, nondistended.  No hepatosplenomegaly, no  bruits, no masses.  Good bowel sounds.  EXTREMITIES:  Warm with no  cyanosis, clubbing, or edema.  No rash.  NEUROLOGIC:  Alert and oriented x3.  Cranial nerves II through XII are  intact.  Moves all 4 extremities without difficulty.  Affect is  pleasant.   EKG shows sinus rhythm.  She has T-wave inversions from V1-V3, which are  chronic.   ASSESSMENT AND PLAN:  1. Congestive heart failure secondary to nonischemic cardiomyopathy.      This has recovered.  She looks great.  Volume status looks good.      She is on good medicines.  We will check a repeat echocardiogram      just to make sure that she  has not had any decrease in her left      ventricular function.  2. Hypertension.  Her blood pressure seems to be a bit all over the      place and sometimes it appears well.  Other times, it appears high.      I am not sure if this is a white coat syndrome.  We will go ahead      and put an ambulatory blood pressure cuff on her to further      evaluate.   DISPOSITION:  We will get an echocardiogram and an ambulatory blood  pressure monitor.  I will see her back in 6 months.     Bevelyn Buckles. Bensimhon, MD  Electronically Signed    DRB/MedQ  DD: 08/11/2008  DT: 08/12/2008  Job #: [id number]

## 2010-11-06 NOTE — Assessment & Plan Note (Signed)
[facility]                            CARDIOLOGY OFFICE NOTE   NAME:[name] [name], [name] [name]                          MRN:          [id number]  DATE:10/09/2007                            DOB:          1951/05/31    PRIMARY CARE PHYSICIAN:  Dr. Rodrigo Ran.   INTERVAL HISTORY:  Rayvn is a very pleasant 60 year old woman with a  history of hypertension, ongoing tobacco use, fibromyalgia and  nonischemic cardiomyopathy, a previous ejection fraction in the 25% to  35% range.  She returns today for routine follow-up.   She says she has been having some fatigue.  Overall, her heart failure  symptoms are stable.  She does get short of breath when she is carrying  a load of groceries and a computer, but otherwise is doing well.  No  orthopnea or PND.  She has not been compliant with her Norvasc, as she  occasionally forgets to take it.   She did have an echocardiogram today, which I looked at briefly.  She  has now totally normal ejection fraction, an EF of about 55%.   CURRENT MEDICATIONS:  1. Flexeril.  2. Magnesium.  3. Calcium.  4. Aspirin 81.  5. Coreg 25 b.i.d.  6. Altace 10 b.i.d.  7. Neurontin.  8. Norvasc 5 mg a day which she is not taking regularly.  9. Coenzyme Q 10.  10.Vitamin E.  11.Lasix 10 mg a day.  12.Potassium 20 mEq every other day.   EXAMINATION:  GENERAL:  She is well-appearing, no acute distress,  ambulates around the clinic without any respiratory difficulty.  VITAL SIGNS:  Blood pressure is 148/75.  Weight is 125 which is stable.  HEENT:  Normal.  NECK:  Supple.  No JVD.  Carotid 2+ bilaterally bruits.  There is no  lymphadenopathy or thyromegaly.  CARDIAC:  PMI is nondisplaced.  Regular rate and rhythm.  No murmurs,  rubs or gallops.  LUNGS:  Clear.  ABDOMEN:  Soft, nontender, nondistended.  No hepatosplenomegaly, no  bruits, no masses.  Good bowel sounds.  EXTREMITIES:  Warm with no cyanosis, clubbing or edema.  No rash.  NEURO:  Alert and oriented x3.  Cranial nerves II-XII are intact, moves  all four extremities without difficulty.  Affect is mildly flattened.   ASSESSMENT/PLAN:  1. Congestive heart failure secondary nonischemic cardiomyopathy.      Evoleth has had a total recovery of her ejection fraction, after      nearly 3 years of treatment.  This is quite usual, but we are      thrilled for her.  We will check an echocardiogram in 3 months to      make sure that this remains this way.  She will need to continue      all her medications all lifelong, and she obviously has a pre-      disposition for cardiomyopathic processes.  2. Hypertension.  Blood pressure is elevated.  I reminded her to take      her Norvasc.  3. Ongoing tobacco use.  She continues to smoke two  packs every week.      However, I reminded her of the need to quit.   DISPOSITION:  We will see her back in 3 months with an echocardiogram.     Bevelyn Buckles. Bensimhon, MD  Electronically Signed    DRB/MedQ  DD: 10/09/2007  DT: 10/09/2007  Job #: [id number]

## 2010-11-09 NOTE — Assessment & Plan Note (Signed)
[name] [name]                          CHRONIC HEART FAILURE NOTE   NAME:[name] [name], [name]                          MRN:          [id number]  DATE:09/02/2006                            DOB:          30-Dec-1950    PRIMARY CARE PHYSICIAN:  Dr. Rodrigo Ran.   PRIMARY CARDIOLOGIST:  Dr. Nicholes Mango.   PULMONARY:  Dr. Shelle Iron.   [name] [name] returns today for followup regarding her congestive heart  failure which is secondary to idiopathic nonischemic cardiomyopathy.  Most recent echocardiogram shows an EF of 25-30%.  Status post T wave  alternans test in December of 2007.  T wave alternans stress test was  negative, interpreted by Dr. Berton Mount.  Recommended annual T wave  testing.  Status post CPX test in November of 2007 that demonstrated a  mild functional limitation.  The limitation appeared to be primarily due  to hypoventilation and raises the question of __________ receptor  insensitivity or respiratory muscle weakness.  There was no obvious  cardiac limitation.  [name] [name] has done well since that time.  Has  tolerated medication without problems.  Is very compliant with  medications.  Evaluates her blood pressure and heart rate at home on a  daily basis.  Blood pressure ranging between 115/64 to 138/69 with a  heart rate ranging between 77 and 88.  [name] continues to work full  time.  Denies any symptoms suggestive of volume overload.  Denied any  palpitations, pre-syncope, or syncopal episodes.   PAST MEDICAL HISTORY:  1. Includes congestive heart failure secondary to nonischemic      cardiomyopathy.  Status post cardiac catheterization in 2006 showed      normal coronaries with an EF of 20%.  CPX and T wave alternans, as      stated above.  2. Hypertension.  3. Ongoing tobacco use.  4. Ongoing fibromyalgia.  5. Chronic migraines.  6. Lung nodule followed by Dr. Shelle Iron.   REVIEW OF SYSTEMS:  As stated above.   ALLERGIES:  SULFA, LEXAPRO,  FOSAMAX, PAXIL, NSAID, IMIPRAMINE.   CURRENT MEDICATIONS:  1. Norvasc 5 mg daily.  2. Flexeril 10 mg daily.  3. Calcium magnesium twice a day.  4. Furosemide 20 mg every other day.  5. K-Dur 20 mEq on the same days as Lasix.  6. Aspirin 81.  7. Coreg 25 b.i.d.  8. Altace 10 mg b.i.d.  9. Gabapentin 100 mg in the a.m. 300 mg in the evening.  10.Imitrex p.r.n.   PHYSICAL EXAM:  Weight 126, blood pressure 118/71, pulse of 83.  [name] [name] has no jugular venous distension.  LUNGS:  Clear to auscultation.  CARDIOVASCULAR:  Exam reveals an S1, S2.  Regular rate and rhythm.  ABDOMEN:  Soft and nontender.  Positive bowel sounds.  LOWER EXTREMITIES:  Without cyanosis, clubbing, or edema.   IMPRESSION:  Stable heart failure at this time.  Class II symptoms.  The  patient does state, however, she feels washed out on the days she takes  her Lasix.  We are going to cut it back  to 10 mg every other day.  Continue her potassium 20 mEq every other day with Lasix.  The last lab  work I have available on her was done in April of 2007.  Her potassium  was 3.9 at that time.  I have asked her to continue potassium.  She  states she has a complete physical exam coming up with Dr. Waynard Edwards and  already has orders for blood work.  I have asked that she have a TSH, a  BNP, and a CMET added to her labs.  She has a scheduled draw for the end  of this month.  I have also given her a prescription to give these to  the lab tech when she has her blood drawn, and I ask that Dr. Waynard Edwards fax  the results of all her labs to me.  I will see [name] [name] back in 2 months,  sooner if she has any problems with the decrease in her diuretic.      Dorian Pod, ACNP  Electronically Signed      Rollene Rotunda, MD, [name]  Electronically Signed   MB/MedQ  DD: 09/02/2006  DT: 09/04/2006  Job #: [id number]   cc:   Loraine Leriche A. Perini, M.D.

## 2010-11-09 NOTE — Assessment & Plan Note (Signed)
[facility]                          CHRONIC HEART FAILURE NOTE   NAME:[name] [name], [name] [name]                          MRN:          [id number]  DATE:07/08/2006                            DOB:          1951/06/02    PRIMARY CARE PHYSICIAN:  Loraine Leriche A. Perini, M.D.   PRIMARY CARDIOLOGIST:  Bevelyn Buckles. Bensimhon, M.D.   [name] [name] returns today for followup regarding her congestive heart  failure which is secondary to idiopathic nonischemic cardiomyopathy.  Most recent echocardiogram done in August 2007 showed an EF of 25-35%,  previous EF of 20-25% in March 2005.  [name] [name] had continued to complain  of generalized fatigue and decreased energy level.  She underwent a CPX  testing that showed exercise testing with gas exchange demonstrated a  mild functional limitation when compared to matched normal sedentary  subjects.  Limitations appeared to be primarily due to hypoventilation.  There was no obvious cardiac limitation.  I have discussed with Ms. Duva  in the past the possibility of defibrillator implantation but she has  been hesitant to proceed with any type of invasive procedures.  She did,  however, consent to a T-wave alternans test which she underwent on  December 14 with Dr. Graciela Husbands.  It was determined her risk of ventricular  arrhythmias was low and he recommended annual T-wave testing.  [name] [name]  states she feels good today.  In discussing with her her status over the  last year, she states that a year ago that she did not feel anywhere  near as well as she does currently.  She is pleased with her progress.  She feels her limitations are very light in regards to her quality of  life.  She is able to continue working 40 hours a week and enjoying her  time off.  She denies any orthopnea, PND, presyncope or syncopal  episodes.   PAST MEDICAL HISTORY:  Includes:  1. Congestive heart failure secondary to nonischemic cardiomyopathy      status post cardiac  catheterization in 2006 that showed normal      coronaries with an EF of 20; most recent echocardiogram in August      2007 showed an EF of 25-35.  2. Hypertension.  3. Ongoing tobacco use.  4. Fibromyalgia.  5. Chronic migraines.  6. Lung nodule, followed by Dr. Shelle Iron.   REVIEW OF SYSTEMS:  As stated above.   ALLERGIES:  1. SULFA.  2. LEXAPRO.  3. FOSAMAX.  4. PAXIL.  5. NSAIDS.  6. IMIPRAMINE.   CURRENT MEDICATIONS:  1. Norvasc 5 mg.  2. Altace 10 b.i.d.  3. Coreg 25 b.i.d.  4. Aspirin 81.  5. K-Dur 20.  6. Lasix 20 every-other day.  7. Multivitamins.   PHYSICAL EXAMINATION:  VITAL SIGNS:  Weight today 126, blood pressure  136/72 with a pulse of 80.  GENERAL:  [name] [name] is in no acute distress.  No jugular vein distention  at 45-degree angle.  LUNGS:  Clear to auscultation bilaterally.  CARDIOVASCULAR:  S1 and S2, regular rate and rhythm.  ABDOMEN:  Soft, nontender, positive bowel sounds.  LOWER EXTREMITIES:  No clubbing, cyanosis, or edema.   IMPRESSION:  The patient with stable class I-II symptoms with no change  in current medications.  Will continue to follow the patient.  Will see  her back in 2 months, sooner if she has any problems.      Dorian Pod, ACNP  Electronically Signed      Bevelyn Buckles. Bensimhon, MD  Electronically Signed   MB/MedQ  DD: 07/08/2006  DT: 07/09/2006  Job #: [id number]   cc:   Loraine Leriche A. Perini, M.D.

## 2010-11-09 NOTE — Cardiovascular Report (Signed)
[name] [name], [name] [name]                   ACCOUNT NO.:  [id number]   MEDICAL RECORD NO.:  [id number]          PATIENT TYPE:  INP   LOCATION:  4733                         FACILITY:  [facility]   PHYSICIAN:  Arturo Morton. Riley Kill, M.D. [facility] OF BIRTH:  June 07, 1951   DATE OF PROCEDURE:  01/23/2005  DATE OF DISCHARGE:                              CARDIAC CATHETERIZATION   INDICATIONS:  [name] [name] is a 60 year old who presents with dyspnea over  several weeks. She has become increasingly short of breath. She denies any  ongoing significant chest pain.   PROCEDURES:  1.  Left and right heart catheterization.  2.  Selective coronary arteriography.  3.  Selective left ventriculography.  4.  Aortic root aortography.   DESCRIPTION OF PROCEDURE:  The patient was brought to the cath lab and  prepped and draped in usual fashion following informed consent. Through an  anterior puncture, the right femoral vein was entered and a 7-French sheath  was placed. A 7.5 French thermodilution Swan-Ganz catheter was then passed  through sequential right heart chambers. A superior vena cava saturation and  pulmonary artery saturation were also obtained to exclude shunt. Following  this, thermodilution cardiac outputs were performed. The right femoral  artery was then entered and a 6-French sheath was placed. Central aortic and  left ventricular pressures were measured with pigtail. Simultaneous wedge LV  were then recorded. Simultaneous RV/LV were recorded to exclude a  restrictive cardiomyopathy. The Swan-Ganz was subsequently removed.  Following this, coronary arteriography was performed with standard Judkins  catheters. We did use a JL-3.5 to engage the left coronary and even this was  on the large side. She tolerated the procedure well. And there were no  complications. She was taken to the holding area in satisfactory clinical  condition.   HEMODYNAMIC DATA:  1.  Central aortic pressure 149/92.  2.  Left atrial  pressure 134/24.  3.  Right atrial pressure 10.  4.  RV 44/11.  5.  Pulmonary artery 42/24, mean 32.  6.  Pulmonary capillary wedge 22.  7.  Fick cardiac output 3.80 liters per minute.  8.  Fick cardiac index 2.4 liters per minute per meter squared.  9.  Thermodilution cardiac output 2.9 liters per minute.  10. Thermodilution cardiac index 1.9 liters per minute per meters squared.   ANGIOGRAPHIC DATA:  1.  Overall ventriculography reveals severe global hypokinesis with overall      ejection fraction would be estimated in the 20% range. There is trace      mitral regurgitation.  2.  The aortic root demonstrates no significant aortic regurgitation and no      evidence of dissection.  3.  The right coronary artery is large-caliber vessel that provides      posterior descending and posterolateral system and is free of critical      disease.  4.  The left main is free of critical disease.  5.  The left anterior descending artery courses to the apex and demonstrates      a diagonal and LAD which coursed to the apex.  They both appear free of      critical disease.  6.  The circumflex demonstrates a ramus intermedius that has perhaps mild      ostial irregularity but no significant areas of high-grade obstruction.  7.  The AV circumflex demonstrates two marginal branches that are moderately      small and free of critical disease.   IMPRESSION:  1.  Severe nonischemic cardiomyopathy of uncertain etiology.  2.  No significant high-grade coronary obstruction.   PLAN:  I have discussed the case with Dr. Nicholes Mango. We will get a  consult from Dr. Gala Romney regarding her heart failure management. We will  obtain further evaluation and treatment with time. She will likely be put on  a cardiac regimen with ACE inhibition as well as beta blockade. Further  evaluation will be discussed with Dr. Gala Romney.       TDS/MEDQ  D:  01/23/2005  T:  01/24/2005  Job:  [id number]   cc:   Loraine Leriche A.  Waynard Edwards, M.D.  [address]  [place]  [place] [address]  Fax: [phone]   Duke Salvia, M.D.   Arvilla Meres, M.D. [facility]   Patient's medical record

## 2010-11-09 NOTE — Consult Note (Signed)
[name] [name], [name] [name]                   ACCOUNT NO.:  [id number]   MEDICAL RECORD NO.:  [id number]          PATIENT TYPE:  INP   LOCATION:  4733                         FACILITY:  [facility]   PHYSICIAN:  Duke Salvia, M.D.  DATE OF BIRTH:  1951/06/24   DATE OF CONSULTATION:  DATE OF DISCHARGE:                                   CONSULTATION   REASON FOR EVALUATION:  [name] [name] is seen in the emergency room at the  request of Dr. Waynard Edwards because of tachycardia and progressive dyspnea on  exertion with an enlarged cardiac silhouette on chest x-ray.   HISTORY OF PRESENT ILLNESS:  [name] [name] is a 60 year old woman who has a past  medical history notable for difficulty hearing with bilateral hearing aids,  fibromyalgia, migraines requiring Imitrex, fibromyalgia on multiple medical  therapies, who has had progressive dyspnea on exertion, first noted about a  month ago. At that time, she had flown from [place] to [place], [place] and  noticed dyspnea while walking with her husband. He to was complaining of  dyspnea. She returned a few days later, now nearly 4 weeks ago. Since that  time, her dyspnea has been recurrent and has been quite limiting in some  situations. It is not noticeable at rest. It is not associated with  discomfort in her chest. She has had no swelling of her lower extremities.  She had no pleurisy. She has no risk factors for deep vein thrombosis,  specifically no family history and no hormonal therapy. The trip that she  took is as noted above. She has a long standing history of tachycardia. Her  heart rates have been told to her to be 90 to 100 for years. I am sure that  her thyroid status is normal, though those labs are not available.   Her cardiac risk factors are notable for a family history in a paternal aunt  and she also smokes cigarettes at 2 packs per week.   PAST MEDICAL HISTORY:  Notable primarily as above.   PAST SURGICAL HISTORY:  Notable for back surgery.   SOCIAL HISTORY:  She is married. She has 3 children. She drinks  occasionally. Smokes as noted and does not use recreational drugs.   MEDICATIONS:  Include Evista 60, Neurontin, Ambien p.r.n., calcium.   ALLERGIES:  SULFA, LEXAPRO, FOSAMAX, PAXIL, NSAID'S, IMIPRAMINE.   PHYSICAL EXAMINATION:  GENERAL:  A well developed, well nourished, petite,  Caucasian female, appearing her stated age of 11, apart from bilateral  hearing aids.  HEENT:  Examination demonstrated on xanthoma.  NECK:  Veins were 8 cm with positive hepatojugular reflex. Carotids were  brisk bilaterally without bruits.  BACK:  Without kyphosis or scoliosis.  LUNGS:  Bilateral crackles.  HEART:  Sounds were regular and rapid. P2 was loud. There was also an RV  lift. P2 was not palpable.  ABDOMEN:  Soft without active bowel sounds without midline pulsation or  hepatomegaly.  EXTREMITIES:  Femoral pulses were 2+. Distal pulses were intact. There is no  clubbing, cyanosis, or edema. No popliteal tenderness was appreciated.  NEUROLOGIC:  Examination was grossly normal.  SKIN:  Warm and dry.   LABORATORY DATA:  Electrocardiogram dated this evening demonstrated sinus  rhythm at 105 with interval of 1.8/0.09/0.31. There was ST segment  depression with T wave inversions at leads 2, 3, F, V5 and V6. This is quite  different from an electrocardiogram in January of 2004.   Laboratories are notable for a normal D-dimer. O2 saturation was 97% on 2  liters. Creatinine was 1.0. Troponin was 0.03. White count was 7.1 with a  hemoglobin of 12.9.   IMPRESSION:  1.  Progressive dyspnea on exertion in the setting of recent air travel with      negative D-dimer.  2.  Evidence of RV volume and pressure overload with increased jugular      venous distention and an RV lift, but also associated with bilateral      crackles.  3.  Cardiac risk factors notable for family history and cigarettes.  4.  Resting tachycardia, long term, recently  worse.  5.  Fibromyalgia.  6.  No family history or hormonal therapy for risk factors for deep vein      thrombosis.   DISCUSSION:  [name] has dyspnea on exertion in a clinical situation where  pulmonary embolism is suggested by the air travel. The D-dimer being normal  speaks very strongly against this as an outpatient setting. Unless the CT  scan is unequivocal, I think further pursuit of her right ventricular  pathology would be appropriate. This includes evidence of volume as well as  pressure overload. I am a little bit surprised of her lung examination  demonstrating bilateral crackles. If this does not represent pulmonary  edema, i.e. if her BNP is normal, then this may represent a pulmonary  interstitial fibrotic process and this may be the explanation for her right  ventricular signs.   RECOMMENDATIONS:  Based on the above, therefore:  1.  Await CT and would ask them to do interstitial windows if possible.  2.  A 2-D echocardiogram in the morning with special attention to right      ventricular size and pressure.  3.  If the echocardiogram is abnormal, will recommend right and left heart      catheterization. If the echocardiogram is normal, would proceed with      myocardial perfusion imaging.   Thank you for this consultation.       SCK/MEDQ  D:  01/22/2005  T:  01/23/2005  Job:  [id number]

## 2010-11-09 NOTE — H&P (Signed)
[name] [name], [name] [name]                   ACCOUNT NO.:  [id number]   MEDICAL RECORD NO.:  [id number]           PATIENT TYPE:   LOCATION:                                 FACILITY:   PHYSICIAN:  Mark A. Perini, M.D.   DATE OF BIRTH:  24-Apr-1951   DATE OF ADMISSION:  01/22/2005  DATE OF DISCHARGE:                                HISTORY & PHYSICAL   CHIEF COMPLAINT:  Shortness of breath.   HISTORY OF PRESENT ILLNESS:  [name] [name] is a pleasant 60 year old female with a  past medical history significant for chronic migraine headaches and  fibromyalgia syndrome as well as osteopenia.  She was in her usual state of  health until she flew to [place], [place], on December 18, 2004.  A day or two into  her trip, she had some significant shortness of breath while walking.  This  symptom has persisted since she returned home after July 4.  She  occasionally feels a tightness in her chest and like she is not getting  enough oxygen.  One week prior to her visit today, she had a headache and  took some Tylenol and Imitrex and, at that time, she developed very  significant shortness of breath and could not really walk across the room,  this lasted for approximately two hours and she eventually had to go lie  down and rest.  She denies any significant chest pains.  She denies any  swelling of her legs at anytime, one side or the other or both.  She has had  no fevers.  She has had some cough with no real productive aspect of the  cough.  Her legs have felt somewhat weak lately.  She did have some left  neck stiffness in the last week.  In the office, she was noted to have some  new inferolateral T-wave inversions and some cardiac enlargement on chest x-  ray which is new to our knowledge.  Therefore, she is being admitted for  further evaluation and treatment.   PAST MEDICAL HISTORY:  1.  Back surgery in 1986 for a ruptured disc.  2.  Chronic migraine headaches.  3.  G3P3 status, all normal vaginal deliveries.  4.   History of tonsillectomy.  5.  In 1990, right wrist fracture.  6.  Post menopausal since age 88.  7.  Bilateral tubal ligation.  8.  Benign breast cyst removed.  9.  Hearing loss which is hereditary.  10. Heart murmur first diagnosed in November 2001.  11. Osteopenia at all sites diagnosed in November 2000.  12. Fibromyalgia in April 2004.   ALLERGIES:  Sulfa drugs, Lexapro causes myalgias, Fosamax caused stomach  upset, orange juice causes headaches, Paxil caused achiness and no energy,  NSAIDs cause stomach upset, and Imipramine made her nervous.   CURRENT MEDICATIONS:  Calcium with D, 1200 mg of calcium component daily,  vitamin E 400 units daily which she takes for leg pains, Ambien 5 mg as  needed, Imitrex rarely, Phenergan rarely, Depakote 250 mg once daily,  Flexeril 10 mg q.h.s., E-Vista 60  mg daily, magnesium supplement daily,  coenzyme daily, Neurontin 100 mg each morning and each midday, 200 mg each  evening, Relafen as needed.   SOCIAL HISTORY:  [name] [name] is married since 1969 to [place], she has three sons, one  grandchild.  She has a high school education.  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  She had a 10-pack-year smoking history  but quit in 1990, she uses very rare wine, no drug use history.   FAMILY HISTORY:  Father and mother are both alive and in their late 36s.  Her father has congestive heart failure.  Her mother has hypertension and  arthritis.  She has one obese sister.  Her three sons are relatively  healthy.   REVIEW OF SYMPTOMS:  As per the history of present illness.  The patient  denies any blood from above or below.  She has had a normal appetite and no  significant change in weight.   PHYSICAL EXAMINATION:  VITAL SIGNS:  Weight 131 pounds, blood pressure 112/72, pulse 105 and  regular, oxygen saturation 96% on room air, peak flows are somewhat reduced  ranging from 200 to 250.  GENERAL:  She is in no acute distress.  There is  no pallor or icterus.  LUNGS:  There is some note of decreased breath sounds bilaterally with a few  bronchial breath sounds.  HEART:  Tachycardic with no significant murmur heard today.  There is no  peripheral edema.  ABDOMEN:  Soft, nontender.  NEUROLOGICAL:  Alert and oriented x 4 and neurologically intact.   LABORATORY DATA:  Lab data is pending at this time.  EKG shows sinus  tachycardia with a rate of 104 beats per minute, left axis deviation which  is new compared to her previous EKG in August 2004.  She has normal  electrical intervals.  No definite hypertrophy.  She has no pathologic Q-  waves but does have inferolateral T-wave inversions which are new compared  to January 2004.  Chest x-ray shows no definite edema or infiltrate but she  does have cardiac enlargement with possibly some right ventricular  enlargement noted.   ASSESSMENT AND PLAN:  60 year old female with shortness of breath and  dyspnea on exertion over the last 3-4 weeks with new inferolateral EKG  changes and cardiac enlargement noted on chest x-ray.  Differential  diagnosis includes pulmonary embolism versus subacute cardiac ischemia or  new onset of congestive heart failure.  We will admit to telemetry bed.  We  will place the patient on IV heparin.  We will obtain a CT scan of the chest  with PE protocol to rule out PE.  We will check cardiac enzymes and EKGs and  we will ask for a cardiology consult.  We will place the patient on aspirin.  She is a full code status.       MAP/MEDQ  D:  01/22/2005  T:  01/22/2005  Job:  [id number]

## 2010-11-09 NOTE — Assessment & Plan Note (Signed)
[facility]                   COUMADIN / CHRONIC HEART FAILURE CLINIC NOTE   NAME:[name] [name], [name] [name]                          MRN:          [id number]  DATE:01/13/2006                            DOB:          May 27, 1951    [name] [name] returns today for further evaluation and medication titration for  her congestive heart failure secondary to idiopathic ischemic cardiomyopathy  with an ejection fraction of 20-25% by echocardiogram in March of 2007.  The  patient states she has been feeling quite well.  She is concerned about the  abnormal CT scan recently done in May of this year, at which time the  patient was found to have a slight increase in the size of the mass noted in  her left upper lobe by CT scan last fall.  The patient is being followed by  Dr. Shelle Iron regarding this.  The patient states she recently saw Dr. Shelle Iron,  who has recommended further followup with a PET scan or possible removal of  mass or repeat CT scan in a few months.  The patient states she has not  decided at this time which of the three she would like to do.  She is  leaning towards repeat CT scan in a few months.  I reinforced the need for  close followup with Dr. Shelle Iron.  Otherwise, [name] [name] continues to work 40  hours a week.  She complains of just mild dyspnea on exertion, as far as  crossing the parking lot to get into the grocery stores.  Otherwise, no  peripheral edema, no orthopnea or PND.   PAST MEDICAL HISTORY:  As stated in my previous note.   REVIEW OF SYSTEMS:  As stated above in the history of present illness.   MEDICATIONS:  1.  Furosemide 20 mg 5 days a week.  2.  Potassium 20 mEq five days a week.  3.  Aspirin 81 mg daily.  4.  Gabapentin 100 mg in the morning, 100 mg at mid-day, and 100 in the      evening.  5.  Coreg 25 mg p.o. b.i.d.  6.  Altace 10 mg p.o. b.i.d.  7.  Norvasc 2.5 mg daily.  8.  Calcium.  9.  Vitamin D.  10. Magnesium.  11. Manganese.  12. B complex vitamins.   PHYSICAL EXAMINATION:  VITAL SIGNS:  Weight 125, blood pressure 126/76,  pulse 73.  GENERAL:  The patient is in no acute distress, very pleasant and cooperative  female.  NECK:  Jugular venous distention 8-9 cm at a 45 degree angle.  LUNGS:  Clear to auscultation bilaterally.  CARDIOVASCULAR:  S1 and S2, regular rate and rhythm.  ABDOMEN:  Soft, nontender.  Positive bowel sounds.  The patient has trace  peripheral edema.   IMPRESSION:  Stable class I to II heart failure.  Last echocardiogram done  in March showed an ejection fraction of 20-25%.  The patient, since that  time, has maintained an adequate dose of Coreg at 25 mg p.o. b.i.d.  Will  plan on repeating her echocardiogram in August, and followup in the heart  failure clinic after that.  In the interim, the patient needs to make a  decision about her abnormal chest CT and followup with Dr. Shelle Iron.                                 Dorian Pod, ACNP    MB/MedQ  DD:  01/13/2006  DT:  01/13/2006  Job #:  [id number]

## 2010-11-09 NOTE — Assessment & Plan Note (Signed)
[facility]                               PULMONARY OFFICE NOTE   NAME:Bohlken, KAELAN EMAMI                          MRN:          [id number]  DATE:12/26/2005                            DOB:          06-30-1950    HISTORY OF PRESENT ILLNESS:  The patient is a 60 year old female who I have  been asked to see for an abnormal chest CT.  The patient underwent a CT scan  of the chest in November of 2006, and was found to have a 9 mm density in  the left apex, which was unchanged from a CT in August of 2006.  The patient  then underwent followup in May of this year, where there was a slight  interval increase in the size of the density at the left apex, and again it  was unclear whether this is solid or an area of focal airspace disease.  The  patient states that she has been having shortness of breath since August of  last year, and was diagnosed with congestive heart failure.  This was when  she had the initial CT scan.  She denies any cough or mucus, and her weight  has been stable.  The patient has a smoking history of 2-3 packs per week,  starting at age 58, she quit smoking x19 years, and then restarted smoking  again in 2003.  The patient has no known TB exposure, and has had a negative  PPD in the past.   PAST MEDICAL HISTORY:  1. Hypertension  2. History of congestive heart failure.  3. History of back surgery.   CURRENT MEDICATIONS:  1. BuSpar SR 150 mg b.i.d.  2. Fexofenadine 180 mg daily.  3. Simvastatin 20 mg daily.  4. Hydrochlorothiazide 12.5 mg daily.  5. Verapamil 240 mg daily.  6. Flonase 2 sprays daily.  7. Lunesta 3 mg q.h.s. p.r.n.  8. Maxalt and hydrocodone p.r.n.   ALLERGIES:  PENICILLIN and TETRACYCLINE.   SOCIAL HISTORY:  The patient is married, she works as a Tax adviser.  Smoking  history was detailed in history of present illness.   FAMILY HISTORY:  Noncontributory.   REVIEW OF SYSTEMS:  As per history of present illness,  and also see the  patient intake form documented in the chart.   PHYSICAL EXAMINATION:  GENERAL:  She is a well-developed female in no acute  distress.  VITAL SIGNS:  Blood pressure is 118/70, pulse 91, temperature is 98.3,  weight is 125 pounds.  O2 saturation on room air is 99%  HEENT:  Pupils equal, round and reactive to light and accommodation.  Extraocular muscles are intact.  Nares are patent without discharge.  Oropharynx is clear.  NECK:  Supple without JVD or lymphadenopathy.  There is no palpable  thyromegaly.  CHEST:  Totally clear to auscultation.  CARDIAC:  Exam reveals regular rate and rhythm, no murmurs, rubs or gallops.  ABDOMEN:  Soft and nontender, with good bowel sounds.  GENITAL EXAM, RECTAL EXAM AND BREAST EXAM:  Not done and not indicated.  EXTREMITIES:  Lower extremities shows trace edema.  Pulses are intact  distally.  NEUROLOGIC:  Alert and oriented with no obvious motor deficits.   IMPRESSION:  Ill-defined nodular density in the left upper lobe with a  slight increase in size from November of 2006.  It is really unclear how  much of this is inflammatory, or whether or not this could be a buccal  alveolar cell cancer.  Unfortunately, this area is too small to biopsy, and  therefore our treatment decisions are limited.  I have asked the patient to  consider proceeding with a thoracoscopic lung biopsy for a wedge resection,  or possibly to consider PET scanning.  Unfortunately, bronchoalveolar cell  cancer will often have a false negative PET scan.  The other option is to do  a 31-month surveillance with CT scanning.  I have gone over all the various  options with her, and the patient is not able to make a decision at this  time.   PLAN:  1. The patient is to consider thoracoscopic resection versus PET scan      versus a 17-month CT surveillance.  Certainly if this is increased in      size again, it needs to be taken out.  The patient is to go home and       think about this, and discuss it with her family.  She is to call me      with her decision in the next few weeks.                                   Barbaraann Share, MD, FCCP   [facility]/MedQ  DD:  02/01/2006  DT:  02/02/2006  Job #:  [id number]   cc:   Bevelyn Buckles. Bensimhon, MD  Loraine Leriche A. Perini, MD

## 2010-11-09 NOTE — Assessment & Plan Note (Signed)
[facility]                              CARDIOLOGY OFFICE NOTE   NAME:[name], [name] JACUINDE                          MRN:          [id number]  DATE:04/15/2006                            DOB:          July 29, 1950    PRIMARY CARE PHYSICIAN:  Dr. Rodrigo Ran.   PATIENT IDENTIFICATION:  [name] [name] is a 60 year old woman who returns for  followup of her nonischemic cardiomyopathy.   PROBLEMS:  1. Congestive heart failure secondary to nonischemic cardiomyopathy.      a.     Cardiac catheterization, August 2006, revealed normal coronary       arteries with an ejection fraction of 20%.      b.     Most recent echocardiogram, August 2007, ejection fraction of 25       to 35%.  2. Hypertension.  3. Ongoing tobacco use.  4. Fibromyalgia.  5. Chronic migraine headaches.  6. Lung nodule being followed by CT.   CURRENT MEDICATIONS:  1. Coreg 25  b.i.d.  2. Altace 10 b.i.d.  3. Norvasc 2.5 a day.  4. Aspirin 81.  5. Potassium 20 mEq every other day.  6. Lasix 20 mg every other day.  7. Flexeril.  8. Ambien.  9. Magnesium.   INTERVAL HISTORY:  [name] [name] returns today for a routine followup.  Overall,  she is doing well.  She says that occasionally she has good days and bad  days but more good days.  She is able to continue working and is also  bowling routinely.  She denies any chest pain.  She has not had any lower  extremity edema nor orthopnea, PND.  She says occasionally she has a sense  that her heart is beating faster but she thinks she is just moving around  more and denies any palpitations.  She has not had any syncope or  presyncope.  She has been compliant with her medications.   PHYSICAL EXAM:  She is well-appearing in no acute distress, ambulates into  our clinic without any respiratory difficulty.  Blood pressure is 140/72.  Heart rate is 83.  Weight is 124 pounds.  HEENT:  Sclerae anicteric.  EOMI.  There is no xanthelasma.  Mucous  membranes are  moist.  NECK:  Supple.  No JVD.  Carotids are 2+ bilaterally without any bruits.  There is no lymphadenopathy or thyromegaly.  CARDIAC:  Regular rate and rhythm.  No murmurs, rubs, or gallops.  No S3.  LUNGS:  Clear.  ABDOMEN:  Soft, nontender, nondistended.  No hepatosplenomegaly.  No bruits.  No masses.  EXTREMITIES:  Warm with no cyanosis, clubbing, or edema.  There are good  distal pulses.  NEURO:  Alert and oriented x3.  Cranial nerves II through XII are intact.  Moves all 4 extremities without difficulty.  She is hard-of-hearing.   EKG shows sinus rhythm at a rate of 83 with nonspecific ST-T wave changes.   ASSESSMENT AND PLAN:  1. Congestive heart failure secondary to nonischemic cardiomyopathy.      Overall, she is doing well.  She is mostly New York Heart Association      class II, may be occasionally an early III.  Volume status looks good.      We will proceed with CPX testing to clearly define her exercise      capacity.  I have also had a long talk with her about a possible      defibrillator.  She remains somewhat reluctant about this.  We will      refer her over to electrophysiology to discuss further.  Could also      consider T-wave alternans.  Of note, I did discuss with her the recent data showing that statin therapy  was likely beneficial for nonischemic cardiomyopathy.  However, she remains  quite reluctant about starting a statin and she has discussed this  previously with Dr. Waynard Edwards.  1. Suboptimally controlled.  Increase Norvasc to 5 a day.   DISPOSITION:  We will see her back in clinic in 2 months.     Bevelyn Buckles. Bensimhon, MD    DRB/MedQ  DD: 04/15/2006  DT: 04/16/2006  Job #: [id number]   cc:   Loraine Leriche A. Perini, M.D.

## 2010-11-09 NOTE — Letter (Signed)
May 08, 2006    Bevelyn Buckles. Bensimhon, MD  [address] [place] [address]   RE:  [name] [name], [name] [name]  MRN:  [id number]  /  DOB:  May 26, 1951   Dear Jesusita Oka:   It was a pleasure to see Sahirah Rudell today for consideration of ICD  implantation in the setting of nonischemic cardiomyopathy.   I first met her a year ago, when she presented to the hospital with  congestive heart failure.  Dr. Waynard Edwards asked [place] to see her, and she was found  after echo demonstrating impaired LV function to undergo a catheterization  with an ejection fraction of about 20%.  Most recent echo was done a couple  months ago to demonstrate an ejection fraction in the 25 to 30% range.   She continues to have class II symptoms with fatigue, although she is able  to get around quite well.  She does not have orthopnea or nocturnal dyspnea.   She has not had syncope or palpitations.   She has significant problems with fibromyalgia and chronic migraines.  There  is a lung nodule also being followed on CT scanning.   DRUG ALLERGIES:  SULFA, LEXAPRO, FOSAMAX, PAXIL, NSAIDS, and IMIPRAMINE.   SOCIAL HISTORY:  She is married, has 3 children.  She drinks occasionally.  Does not use cigarettes or recreational drugs.   CURRENT MEDICATIONS:  1. Coreg 25 b.i.d.  2. Altace 10 b.i.d.  3. Aspirin 81.  4. Furosemide 20 mg 5 times a week.  5. Potassium.  6. Flexeril.  7. Norvasc 5 mg a day.   EXAMINATION:  Her blood pressure is 144/78, her pulse is 78.  LUNGS:  Clear.  Neck veins were 9 cm.  HEENT:  No icterus, no xanthomata.  BACK:  Without kyphosis or scoliosis.  HEART:  Sounds were regular with a 2/6 systolic murmur.  ABDOMEN:  Soft with active bowel sounds without midline pulsation or  hepatomegaly.  Femoral pulses were 2+.  Distal pulses were intact and there is no cyanosis, clubbing, or edema.  NEUROLOGIC:  Grossly normal.  SKIN:  Warm and dry.   ELECTROCARDIOGRAM:  Notable for a narrow QRS.   She underwent  CPX testing today, which is pending.   IMPRESSION:  1. Nonischemic cardiomyopathy.  2. Class II congestive heart failure.  3. Hypertension.   PLAN:  I think Mrs. Bowne qualifies for ICD implantation based on the  definite SCD-HeFT data.  She is somewhat reluctant, as you know.  I think  the Alpha trial and its application to nonischemic myopathies may be helpful  in her using T wave alternate and testing to try and further risk-stratify.   We will plan to set this up in the next couple of weeks.   She understands that, if its negative predictive value is really quite good,  and if it is non-negative, the risk we would interpret as being  significantly increased relative to a negative study population.   Again, thanks for the consultation.    Sincerely,      Duke Salvia, MD, [facility]  Electronically Signed    SCK/MedQ  DD: 05/08/2006  DT: 05/08/2006  Job #: [id number]   CC:    Loraine Leriche A. Perini, M.D.

## 2010-11-09 NOTE — Discharge Summary (Signed)
NAMECELESE, BANNER                   ACCOUNT NO.:  [id number]   MEDICAL RECORD NO.:  [id number]          PATIENT TYPE:  INP   LOCATION:  4733                         FACILITY:  [facility]   PHYSICIAN:  Mark A. Perini, M.D.   DATE OF BIRTH:  1951/01/25   DATE OF ADMISSION:  01/22/2005  DATE OF DISCHARGE:  01/24/2005                                 DISCHARGE SUMMARY   DISCHARGE DIAGNOSES:  1.  New onset of idiopathic nonischemic cardiomyopathy with an ejection      fraction of 15-20% with a dilated left ventricle.  2.  New York Heart Association class II to III heart failure symptoms.  3.  History of anxiety.  4.  Multiple small faint shaggy pulmonary nodules bilaterally,  probably due      to an atypical infection which may need a follow-up CT of the chest at      some point to document resolution.  5.  History of degenerative disk disease of the low back.  6.  Chronic migraine headaches.  7.  Fibromyalgia.  8.  Osteopenia.   PROCEDURES:  1.  Cardiology consultation and cardiac catheterization including a left and      right heart catheterization and selective coronary arteriography and      left ventriculography showing severe nonischemic cardiomyopathy of      uncertain etiology. No significant high-grade coronary obstructions were      seen. Ejection fraction was estimated in the 20% range.  2.  The next procedure was a CT scan of the chest with PE protocol which      showed no evidence of pulmonary embolism. She did have some interstitial      edema at the lung bases with a dilated left ventricle and it was felt to      represent heart failure and she also had the multiple small faint shaggy      pulmonary nodules as noted above.   HISTORY OF PRESENT ILLNESS:  Braileigh is a pleasant 60 year old female with a  past medical history significant for chronic migraine headaches and  fibromyalgia syndrome as well as osteopenia. She was in her usual state of  health until she flew to [place],  [place] on December 18, 2004. A day or two into  her trip, she had significant shortness of breath while walking. The  symptoms have persisted since she returned home after December 25, 2004. She  occasionally feels a tightness in her chest like she is not getting enough  oxygen.  One week prior to her office visit, she had a headache and took  some Tylenol and Imitrex and at that time, developed very significant  shortness of breath and could not really walk across the room without  getting very dyspneic. This lasted for two hours and she eventually had to  go lie down and rest. She denied any other significant chest pains. She  denied any swelling of her legs at any time on one side or the other or  both. She denies any fevers. She has had some cough without any productive  aspect. Her legs have felt somewhat weak. In the office she was found to  have new inferolateral T-wave inversions and some cardiac enlargement was  noted on her chest x-ray which were both new finding. She was therefore  admitted for further evaluation and treatment.   HOSPITAL COURSE:  Ms. Mangrum was admitted to a telemetry bed. CT scan of the  chest did not show any evidence of pulmonary embolism. However, she did have  an elevated BNP of 327 and cardiac enlargement on the CT scan as well and  some evidence of pulmonary edema. Cardiology was consulted with the  subsequent workup as noted above. Ms. Edmundson was treated with Altace 2.5 mg  daily. She was enrolled in the compare tried to get Coreg started. She was  eventually discharged on January 24, 2005 to follow-up closely in the heart  failure clinic at [facility] cardiology.   DISCHARGE PHYSICAL EXAMINATION:  VITAL SIGNS:  Temperature 98.2, pulse 85,  respiratory 18, blood pressure 107/66, 95% saturation on room air. Weight  was 127.9 pounds.  GENERAL:  She is in no acute distress. She had a few bibasilar crackles on  inspiration.  HEART:  Heart was regular rate and rhythm with  no significant murmur.  ABDOMEN:  Soft and nontender.  There is no significant edema.   DISCHARGE LABORATORY DATA:  White count 5.9, hemoglobin 11.4, platelet count  278,000, normal differentials. Sodium was 138, potassium 3.5, chloride 105,  CO2 25, BUN 13, creatinine 0.8, glucose 96, calcium 8.1. Liver function  tests on admission were all within normal ranges. Coagulation studies were  normal on admission including a negative D-dimer and an INR of 1.1 and a PTT  of 34. Serial cardiac enzymes were negative x3 full sets.  TSH was normal at  1.55 million international units per mL.   DISCHARGE INSTRUCTIONS:  1.  Dhyana is to follow a low-salt diet.  2.  She is to take calcium with D 1200 mg divide daily with food.  3.  She is discharged on vitamin E 400 units daily which she was on      previously.  4.  Ambien 5 mg at bedtime as needed.  5.  Phenergan as needed.  6.  Depakote 250 mg daily.  7.  Flexeril 5 mg at bedtime.  8.  Evista 60 mg daily. This will actually be put on hold due to her new      congestive heart failure.  9.  She is to take magnesium twice daily.  10. Manganese daily.  11. She is to take Neurontin 100 mg twice daily.  12. She is also to avoid all NSAID's including Relafen or Motrin.  13. She may use Tylenol as needed.  14. She is to take Coreg 3.125 mg twice daily.  15. Altace 25 mg once daily.  16. Enteric-coated aspirin 81 mg daily.  17. Lasix 20 mg as needed for weight gain greater than or equal to 3 pounds.  18. Potassium 20 mEq take as needed with Lasix.  19. Next, she is to follow-up with Dr. Waynard Edwards as previously scheduled.  20. She is to follow up with Shelby Dubin in the heart failure clinic on      Thursday, January 31, 2005 at 9:00 a.m.  21. She is to call the heart failure clinic for any worsening symptoms of      shortness of breath, weight gain or palpitations.           ______________________________  Mark A. Waynard Edwards, M.D.    MAP/MEDQ  D:   03/06/2005  T:  03/06/2005  Job:  [id number]   cc:   Arvilla Meres, M.D. [facility]  [employer]  [address]  [place]  [place] [address]

## 2010-11-09 NOTE — Assessment & Plan Note (Signed)
[facility]                          CHRONIC HEART FAILURE NOTE   NAME:[name] [name], [name] [name]                          MRN:          [id number]  DATE:05/30/2006                            DOB:          October 07, 1950    Primary care physician, Redge Gainer. Perini, MD.  Primary cardiologist,  Bevelyn Buckles. Bensimhon, MD.   [name] [name] returns today for further evaluation and medication titration  of her congestive heart failure, which is secondary to idiopathic  ischemic cardiomyopathy.  Most recent echocardiogram done in August of  this year showed an EF of 25-35% with previous EF of 20-25% in March  2005.  [name] [name] continues to complain of generalized fatigue and  decreased energy level.  She underwent a CPX testing recently that  showed exercise testing with gas exchange demonstrating a mild  functional limitation when compared to matched normal sedentary  subjects.  Limitations appear to be primarily due to hypoventilation.  It raises the question of chemoreceptor and sensitivity of respiratory  muscle weakness.  There was no obvious cardiac limitation.  [name] [name] in  the past has not wished to consider placement of an internal  cardioverter-defibrillator.  I sent her to Dr. Graciela Husbands for evaluation.  He  saw her in consultation on November 15 and felt that the patient would  be a candidate for ICD implantation based on the Definite study data.  The patient once again was reluctant.  He recommended doing a T-wave  alternans test for further risk stratification.  [name] [name] is scheduled  for her T-wave test this coming Monday.  She denies any episodes of  acute shortness of breath, dyspnea on exertion, orthopnea or PND, no  peripheral edema.  She states overall she feels good, just the chronic  fatigue.  Blood pressure at home has been running 130's over 70s.   PAST MEDICAL HISTORY:  1. Congestive heart failure secondary to nonischemic cardiomyopathy,      status post cardiac  catheterization in August 2006 that showed      normal coronaries with an EF of 20.  Most recent echocardiogram      August 2007 showed EF of 25-35%.  2. Hypertension.  3. Ongoing tobacco use.  4. Fibromyalgia.  5. Chronic migraines.  6. Lung nodule, being followed by CT under the care of Dr. Shelle Iron.   REVIEW OF SYSTEMS:  As stated above.   ALLERGIES:  SULFA, LEXAPRO, FOSAMAX, PAXIL, NSAIDs, IMIPRAMINE.   MEDICATIONS:  1. Norvasc 10 mg.  2. Flexeril 10 mg.  3. Calcium and vitamin D.  4. Magnesium.  5. Manganese.  6. B vitamins.  7. Furosemide 20 mg every other day.  8. K-Dur 20 mEq every other day.  9. Aspirin 81 mg.  10.Coreg 25 mg b.i.d.  11.Altace 10 mg b.i.d.   PHYSICAL EXAMINATION:  VITAL SIGNS:  Weight 124, blood pressure 133/78  with a pulse of 82.  NECK:  JVD at 7-8 cm.  LUNGS:  Clear to auscultation bilaterally.  CARDIOVASCULAR:  S2, S2, regular rate and rhythm.  ABDOMEN:  Soft, nontender, positive bowel  sounds.  EXTREMITIES:  Lower extremities without clubbing or cyanosis.   IMPRESSION:  Patient with stable class II symptoms, status post CPX  testing, pending T-wave alternans test for further evaluation.  Will  continue current medications and see patient back early next year.      Dorian Pod, ACNP  Electronically Signed      Bevelyn Buckles. Bensimhon, MD  Electronically Signed   MB/MedQ  DD: 05/30/2006  DT: 05/31/2006  Job #: [id number]

## 2010-11-09 NOTE — Procedures (Signed)
[facility]                              EXERCISE TREADMILL   NAME:Robertshaw, JAMIYA NIMS                          MRN:          [id number]  DATE:06/06/2006                            DOB:          12/23/1950    PROCEDURE:  T-wave Alternans stress test.   Mrs. Ruzich is a 60 year old woman with non-ischemic cardiomyopathy, class  III and now class II congestive failure, for whom ICD has been  recommended and who has been reluctant to proceed.   One thought that we had was trying to help further risk stratify using T-  wave Alternans testing.  She has submitted for that today.   The patient accomplished a good T-wave test.  There were two tests  required to get readable data.  The second was negative.   I discussed with Mrs. Treinen the implications of this.  I think that it is  fair to say that the data and non-ischemic myopathies would support the  conclusion that her risks are less than had her T-wave test been non-  negative.  She asks if this meant the risk would slow, and I said that  we do not have perspective data to state that.  I also mentioned that  recent data, specifically MASTER, has raised a question as to the  negative predictive value.   I think it is probably reasonable to consider annual T-wave testing, as  we know clearly there is evolution of T-wave studies.  I will have to  review the question as to how that impacts prognosis.     Duke Salvia, MD, [facility]  Electronically Signed    SCK/MedQ  DD: 06/06/2006  DT: 06/06/2006  Job #: [id number]

## 2010-11-09 NOTE — Assessment & Plan Note (Signed)
[facility]                   COUMADIN / CHRONIC HEART FAILURE CLINIC NOTE   NAME:Bares, DIMITRI DSOUZA                          MRN:          [id number]  DATE:03/14/2006                            DOB:          Jul 22, 1950    HISTORY OF PRESENT ILLNESS:  Ms. Raybuck returns today for further evaluation  and medication titration for her congestive heart failure secondary to  idiopathic ischemic cardiomyopathy. Most recent echocardiogram done in  August of this year shows an ejection fraction of 25% to 35% with previous  ejection fraction of 20% to 25% in March 2007. Ms. Sayavong states she has been  doing well. She is complaining of some vague shoulder, neck, and back  discomfort, which she contributes to her fibromyalgia. We have been  following her in regards to an abnormal CT scan she had done earlier this  year. The patient had been sent to Dr. Shelle Iron for evaluation. Primary care  physician is Dr. Waynard Edwards. Ms. Hamil states that she had a CT scan just  repeated on March 03, 2006 and was told there were no further changes in  the nodule and she is to have a followup CT in 1 year. She states she also  had some blood work by Dr. Waynard Edwards within the last couple of weeks. In  regards to her heart failure, she is denying any symptoms of volume  overload. No shortness of breath or dyspnea on exertion. No orthopnea or  PND. She continues to work full time. Unfortunately, she continues to smoke  cigarettes sporadically.   PAST MEDICAL HISTORY:  1. Hypertension.  2. Heart failure secondary to non-ischemic cardiomyopathy. Cardiac      catheterization in August 2006 revealed severe global hypokinesis with      an ejection fraction of 20% with a trace of mitral regurgitation.      Echocardiogram in March of 2007 showed an ejection fraction of 20% to      25%. Most recent echocardiogram in August showing an ejection fraction      of 25% to 35%.  3. Chronic migraine  headaches.  4. Fibromyalgia.  5. History of back surgery.  6. Ongoing tobacco use.  7. Abnormal CT of the chest showing a 9 mm nodule in the left lung apex,      being followed by Dr. Shelle Iron and Dr. Waynard Edwards.   REVIEW OF SYSTEMS:  As stated above in history of present illness.  Otherwise, negative.   ALLERGIES:  SULFA, LEXAPRO, FOSAMAX, PAXIL, NSAID'S, IMIPRAMINE.   CURRENT MEDICATIONS:  1. Flexeril 10 mg daily.  2. Calcium, vitamin D, magnesium, manganese.  3. Furosemide 20 mg every other day.  4. K-Dur 20 meq every other day.  5. Aspirin 81 mg daily.  6. Coreg 25 mg p.o. b.i.d.  7. Altace 10 mg p.o. b.i.d.  8. Norvasc 2.5 mg daily.  9. Gabapentin 100 mg each a.m. and 300 mg each p.m.   PHYSICAL EXAMINATION:  VITAL SIGNS:  Weight 123, blood pressure 142/77,  pulse 82. The patient is telling me that she has not taken her medications  yet today.  GENERAL:  No acute distress.  NECK:  Jugular venous distention at 6 to 7 cm at a 45 degree angle.  LUNGS:  Clear to auscultation bilaterally.  CARDIOVASCULAR:  S1 and S2. Regular rate and rhythm.  ABDOMEN:  Soft, nontender. Positive bowel sounds.  EXTREMITIES:  Lower extremities without clubbing, cyanosis, or edema.   IMPRESSION/PLAN:  Stable class 2 heart failure. Most recent echocardiogram  showing an ejection fraction of 25% to 25% with just subtle improvement in  ejection fraction. The patient has been maintained on full dose Coreg for  approximately 6 months. I have discussed with her today, the options  available including T wave alternans test and subsequent EP evaluation. Now  that we know Ms. Urbieta CT scan of the chest is without change, however, she  does not wish to consider any further testing at this time. She states she  will think about the T wave test, however, she states she is not ready to  talk with EP. I will see her back in 4 to 6 weeks. I have also informed her  that if her blood pressure is not under better  control, we will need to  increase her Norvasc to 5. She is going to check and record her blood  pressure at different times throughout the day. She is telling me that she  has not taken any blood pressure medication yet today and that she  occasionally forgets to take her Norvasc, which she takes in the early  afternoon. Will re-evaluate at next appointment.                                 Dorian Pod, ACNP                             Bevelyn Buckles. Bensimhon, MD   MB/MedQ  DD:  03/14/2006  DT:  03/16/2006  Job #:  [id number]

## 2010-12-20 ENCOUNTER — Other Ambulatory Visit: Payer: Self-pay | Admitting: *Deleted

## 2010-12-20 MED ORDER — RAMIPRIL 10 MG PO TABS: 10.0000 mg | ORAL_TABLET | Freq: Every day | ORAL | Status: DC

## 2011-01-22 ENCOUNTER — Other Ambulatory Visit: Payer: Self-pay | Admitting: Internal Medicine

## 2011-01-25 ENCOUNTER — Telehealth: Payer: Self-pay | Admitting: Internal Medicine

## 2011-01-25 NOTE — Telephone Encounter (Signed)
Pt needs call back. She needs f/u but the last day left on schedule she can not come because she will be out of town and not sure when to put her on schedule

## 2011-01-25 NOTE — Telephone Encounter (Signed)
Left a message to call back.

## 2011-01-28 NOTE — Telephone Encounter (Signed)
SPOKE WITH PT  IS DUE FOR F/U WITH DR BENISMHON  IN SEPT NO APPTS AVAILABLE THRU END YEAR  ATTEMPTED TO MAKE APPT  WITH [facility]  PER PT HAS APPT THIS WEEK WITH DR PERINI WILL DISCUSS WITH MD HOW SOON NEEDS TO COME IN AND WILL CALL BACK AND MAKE AN APPT IS OKAY WITH SEEING PA .[name] [name]

## 2011-02-26 ENCOUNTER — Other Ambulatory Visit (HOSPITAL_COMMUNITY): Payer: Self-pay | Admitting: Internal Medicine

## 2011-02-26 DIAGNOSIS — Z1231 Encounter for screening mammogram for malignant neoplasm of breast: Secondary | ICD-10-CM

## 2011-03-08 ENCOUNTER — Ambulatory Visit (HOSPITAL_COMMUNITY)
Admission: RE | Admit: 2011-03-08 | Discharge: 2011-03-08 | Disposition: A | Discharge status: Home / Self Care | Payer: BC Managed Care – PPO | Source: Ambulatory Visit | Attending: Internal Medicine | Admitting: Internal Medicine

## 2011-03-08 DIAGNOSIS — Z1231 Encounter for screening mammogram for malignant neoplasm of breast: Secondary | ICD-10-CM | POA: Insufficient documentation

## 2011-03-14 ENCOUNTER — Encounter: Payer: Self-pay | Admitting: Internal Medicine

## 2011-03-29 ENCOUNTER — Ambulatory Visit (AMBULATORY_SURGERY_CENTER): Payer: BC Managed Care – PPO

## 2011-03-29 VITALS — Ht 60.0 in | Wt 122.0 lb

## 2011-03-29 DIAGNOSIS — M797 Fibromyalgia: Secondary | ICD-10-CM | POA: Insufficient documentation

## 2011-03-29 DIAGNOSIS — M810 Age-related osteoporosis without current pathological fracture: Secondary | ICD-10-CM | POA: Insufficient documentation

## 2011-03-29 DIAGNOSIS — I10 Essential (primary) hypertension: Secondary | ICD-10-CM | POA: Insufficient documentation

## 2011-03-29 DIAGNOSIS — Z1211 Encounter for screening for malignant neoplasm of colon: Secondary | ICD-10-CM

## 2011-03-29 DIAGNOSIS — I509 Heart failure, unspecified: Secondary | ICD-10-CM | POA: Insufficient documentation

## 2011-04-03 ENCOUNTER — Telehealth: Payer: Self-pay | Admitting: Internal Medicine

## 2011-04-03 NOTE — Telephone Encounter (Signed)
Rx for MoviPrep called in to [facility] on [address]. Ezra Sites

## 2011-04-12 ENCOUNTER — Ambulatory Visit (AMBULATORY_SURGERY_CENTER): Payer: BC Managed Care – PPO | Admitting: Internal Medicine

## 2011-04-12 ENCOUNTER — Encounter: Payer: Self-pay | Admitting: Internal Medicine

## 2011-04-12 VITALS — BP 125/68 | HR 65 | Temp 97.4°F | Resp 19 | Ht 60.0 in | Wt 122.0 lb

## 2011-04-12 DIAGNOSIS — Z1211 Encounter for screening for malignant neoplasm of colon: Secondary | ICD-10-CM

## 2011-04-12 MED ORDER — SODIUM CHLORIDE 0.9 % IV SOLN: 500.0000 mL | INTRAVENOUS | Status: DC

## 2011-04-12 NOTE — Patient Instructions (Signed)
Green and blue discharge instructions reviewed with patient and care partner.  Impressions/recommendations:  Normal  Repeat colonoscopy in 10 years.  You may resume medications you were taking prior to your procedure as you had been taking them.

## 2011-04-15 ENCOUNTER — Telehealth: Payer: Self-pay | Admitting: *Deleted

## 2011-04-15 NOTE — Telephone Encounter (Signed)
2Follow up Call- Patient questions:  Do you have a fever, pain , or abdominal swelling? no Pain Score  0 *  Have you tolerated food without any problems? yes  Have you been able to return to your normal activities? yes  Do you have any questions about your discharge instructions: Diet   no Medications  no Follow up visit  no  Do you have questions or concerns about your Care? no  Actions: * If pain score is 4 or above: No action needed, pain <4.

## 2011-04-17 ENCOUNTER — Other Ambulatory Visit: Payer: Self-pay | Admitting: Internal Medicine

## 2011-04-30 ENCOUNTER — Telehealth: Payer: Self-pay | Admitting: Internal Medicine

## 2011-04-30 MED ORDER — AMLODIPINE BESYLATE 5 MG PO TABS: 5.0000 mg | ORAL_TABLET | Freq: Every day | ORAL | Status: DC

## 2011-04-30 NOTE — Telephone Encounter (Signed)
Pt calling re refill request from 10-24 for amlodipine 1 year refills 30 day supply at [facility] [address], not yet called in , pt out, needs asap

## 2011-07-01 ENCOUNTER — Other Ambulatory Visit: Payer: Self-pay | Admitting: Internal Medicine

## 2011-07-22 ENCOUNTER — Other Ambulatory Visit: Payer: Self-pay | Admitting: Internal Medicine

## 2011-08-31 ENCOUNTER — Other Ambulatory Visit: Payer: Self-pay | Admitting: Internal Medicine

## 2011-09-02 ENCOUNTER — Other Ambulatory Visit: Payer: Self-pay

## 2011-09-02 MED ORDER — AMLODIPINE BESYLATE 5 MG PO TABS: 5.0000 mg | ORAL_TABLET | Freq: Every day | ORAL | Status: DC

## 2011-09-26 ENCOUNTER — Other Ambulatory Visit: Payer: Self-pay | Admitting: *Deleted

## 2011-09-27 ENCOUNTER — Other Ambulatory Visit: Payer: Self-pay | Admitting: Internal Medicine

## 2011-09-27 MED ORDER — RAMIPRIL 10 MG PO CAPS: 10.0000 mg | ORAL_CAPSULE | Freq: Two times a day (BID) | ORAL | Status: DC

## 2011-09-27 NOTE — Telephone Encounter (Signed)
Pt said she is out of pills

## 2011-10-02 ENCOUNTER — Other Ambulatory Visit: Payer: Self-pay | Admitting: Internal Medicine

## 2011-10-02 MED ORDER — AMLODIPINE BESYLATE 5 MG PO TABS: 5.0000 mg | ORAL_TABLET | Freq: Every day | ORAL | Status: DC

## 2011-10-02 NOTE — Telephone Encounter (Signed)
Spoke with pt she would like all Rx sent to her PCP -- Dr. Rodrigo Ran.

## 2012-02-25 ENCOUNTER — Other Ambulatory Visit: Payer: Self-pay | Admitting: Obstetrics & Gynecology

## 2012-02-25 DIAGNOSIS — Z1231 Encounter for screening mammogram for malignant neoplasm of breast: Secondary | ICD-10-CM

## 2012-03-13 ENCOUNTER — Ambulatory Visit (HOSPITAL_COMMUNITY): Payer: BC Managed Care – PPO

## 2012-03-26 ENCOUNTER — Ambulatory Visit (HOSPITAL_COMMUNITY)
Admission: RE | Admit: 2012-03-26 | Discharge: 2012-03-26 | Disposition: A | Discharge status: Home / Self Care | Payer: BC Managed Care – PPO | Source: Ambulatory Visit | Attending: Obstetrics & Gynecology | Admitting: Obstetrics & Gynecology

## 2012-03-26 DIAGNOSIS — Z1231 Encounter for screening mammogram for malignant neoplasm of breast: Secondary | ICD-10-CM | POA: Insufficient documentation

## 2012-04-13 ENCOUNTER — Encounter: Payer: Self-pay | Admitting: Internal Medicine

## 2012-12-10 ENCOUNTER — Ambulatory Visit (HOSPITAL_COMMUNITY)
Admission: RE | Admit: 2012-12-10 | Discharge: 2012-12-10 | Disposition: A | Discharge status: Home / Self Care | Payer: Managed Care, Other (non HMO) | Source: Ambulatory Visit | Attending: Internal Medicine | Admitting: Internal Medicine

## 2012-12-10 ENCOUNTER — Encounter (HOSPITAL_COMMUNITY): Payer: Self-pay

## 2012-12-10 VITALS — BP 100/58 | HR 72 | Wt 119.0 lb

## 2012-12-10 DIAGNOSIS — IMO0001 Reserved for inherently not codable concepts without codable children: Secondary | ICD-10-CM | POA: Insufficient documentation

## 2012-12-10 DIAGNOSIS — G43909 Migraine, unspecified, not intractable, without status migrainosus: Secondary | ICD-10-CM | POA: Insufficient documentation

## 2012-12-10 DIAGNOSIS — R0609 Other forms of dyspnea: Secondary | ICD-10-CM

## 2012-12-10 DIAGNOSIS — Z7982 Long term (current) use of aspirin: Secondary | ICD-10-CM | POA: Insufficient documentation

## 2012-12-10 DIAGNOSIS — I10 Essential (primary) hypertension: Secondary | ICD-10-CM | POA: Insufficient documentation

## 2012-12-10 DIAGNOSIS — R0602 Shortness of breath: Secondary | ICD-10-CM

## 2012-12-10 DIAGNOSIS — M810 Age-related osteoporosis without current pathological fracture: Secondary | ICD-10-CM | POA: Insufficient documentation

## 2012-12-10 DIAGNOSIS — I509 Heart failure, unspecified: Secondary | ICD-10-CM | POA: Insufficient documentation

## 2012-12-10 DIAGNOSIS — R0600 Dyspnea, unspecified: Secondary | ICD-10-CM | POA: Insufficient documentation

## 2012-12-10 DIAGNOSIS — I428 Other cardiomyopathies: Secondary | ICD-10-CM | POA: Insufficient documentation

## 2012-12-10 DIAGNOSIS — H9190 Unspecified hearing loss, unspecified ear: Secondary | ICD-10-CM | POA: Insufficient documentation

## 2012-12-10 NOTE — Assessment & Plan Note (Signed)
She was last seen about 4 years ago and at that time EF recovered. Now with new onset dyspnea. Volume status stable. Continue current diuretic regimen. Check stress echo. Follow up in 2-3 months.

## 2012-12-10 NOTE — Progress Notes (Signed)
Patient ID: [name] [name], female   DOB: [birth year], 62 y.o.   MRN: [id number]  Weight Range   Baseline proBNP    PCP: Dr Waynard Edwards  HPI: [name] [name] is a 63 year old woman with a history of hypertension, fibromyalgia, hearing loss, NICM previous ejection fraction of 25-35% 2006 which has recovered -per echocardiogram in July 2009, shows an EF of 50-55%.   LHC 2006  Severe nonischemic cardiomyopathy of uncertain etiology.  No significant high-grade coronary obstruction.   She returns for follow up per request of Dr Waynard Edwards due to increase dyspnea. Over the last month she has noticed increased dyspnea with exertion. She complained of back pain that was relieved with one nitroglycerin. Complains of edema in her hands and she increase lasix to 20 mg daily. Denies PND/orthopnea. Weight at home 116 pounds. Working full time.   Labs obtained at Dr Waynard Edwards office 12/08/12     ROS: All systems negative except as listed in HPI, PMH and Problem List.  Past Medical History  Diagnosis Date  . Fibromyalgia   . Fibromyalgia   . Congestive heart failure   . Hypertension   . Osteoporosis   . Migraines     controlled with meds; down to once or twice yearly    Current Outpatient Prescriptions  Medication Sig Dispense Refill  . acetaminophen (TYLENOL) 500 MG tablet Take 500 mg by mouth every 6 (six) hours as needed for pain. Take 2 tablets as needed.      Marland Kitchen amLODipine (NORVASC) 5 MG tablet Take 1 tablet (5 mg total) by mouth daily.  30 tablet  0  . aspirin 81 MG chewable tablet Chew 81 mg by mouth daily.        . B Complex Vitamins (B COMPLEX 1) tablet Take 1 tablet by mouth daily.        Marland Kitchen buPROPion (WELLBUTRIN SR) 150 MG 12 hr tablet Take 150 mg by mouth once.      . calcium carbonate (OS-CAL) 600 MG TABS Take 600 mg by mouth daily.       . carvedilol (COREG) 25 MG tablet Take 25 mg by mouth 2 (two) times daily with a meal.        . Coenzyme Q10 (CO Q 10) 100 MG CAPS Take by mouth 2 (two) times daily.         . cyclobenzaprine (FLEXERIL) 10 MG tablet Take 10 mg by mouth 3 (three) times daily as needed.        . fish oil-omega-3 fatty acids 1000 MG capsule Take 1 g by mouth 2 (two) times daily at 10 AM and 5 PM.        . furosemide (LASIX) 20 MG tablet Take 10 mg by mouth daily.        Marland Kitchen gabapentin (NEURONTIN) 100 MG capsule       . ibandronate (BONIVA) 150 MG tablet       . KLOR-CON M20 20 MEQ tablet       . Magnesium Malate POWD by Does not apply route.        . Manganese Gluconate 50 MG TABS Take by mouth daily.        . Multiple Vitamin (MULTIVITAMIN) tablet Take 1 tablet by mouth daily.        . niacin 500 MG tablet Take 500 mg by mouth daily with breakfast.        . pravastatin (PRAVACHOL) 40 MG tablet Take 40 mg by mouth 3 (three) times a week.        Marland Kitchen  ramipril (ALTACE) 10 MG capsule Take 1 capsule (10 mg total) by mouth 2 (two) times daily.  60 capsule  0  . SUMAtriptan (IMITREX) 50 MG tablet Take 50 mg by mouth every 2 (two) hours as needed for migraine. Take 0.5 tablet as needed.      Marland Kitchen UNABLE TO FIND Med Name: Tumeric supplement. Takes daily       . Vitamin E (E 400 BLENDED PO) Take by mouth 3 (three) times a week.        . zolpidem (AMBIEN) 10 MG tablet        No current facility-administered medications for this encounter.     PHYSICAL EXAM: Filed Vitals:   12/10/12 1446  BP: 100/58  Pulse: 72  Weight: 119 lb (53.978 kg)  SpO2: 99%    General:  Well appearing. No resp difficulty HEENT: normal Neck: supple. JVP flat. Carotids 2+ bilaterally; no bruits. No lymphadenopathy or thryomegaly appreciated. Cor: PMI normal. Regular rate & rhythm. No rubs, gallops or murmurs. Lungs: clear Abdomen: soft, nontender, nondistended. No hepatosplenomegaly. No bruits or masses. Good bowel sounds. Extremities: no cyanosis, clubbing, rash, edema Neuro: alert & orientedx3, cranial nerves grossly intact. Moves all 4 extremities w/o difficulty. Affect pleasant.   EKG: SR 70  bpm   ASSESSMENT & PLAN:

## 2012-12-10 NOTE — Assessment & Plan Note (Signed)
Check stress echo. EKG ok. Lab work pending from Dr Waynard Edwards.

## 2012-12-10 NOTE — Patient Instructions (Addendum)
Your physician has requested that you have a stress echocardiogram. For further information please visit [email or web address] Please follow instruction sheet as given.  We will contact you in 3 months to schedule your next appointment.

## 2012-12-11 NOTE — Addendum Note (Signed)
Encounter addended by: Deitra Mayo, CCT on: 12/11/2012  8:23 AM<BR>     Documentation filed: Charges VN

## 2012-12-18 ENCOUNTER — Encounter: Payer: Self-pay | Admitting: Internal Medicine

## 2012-12-18 ENCOUNTER — Ambulatory Visit (HOSPITAL_COMMUNITY): Payer: Managed Care, Other (non HMO) | Attending: Internal Medicine | Admitting: Radiology

## 2012-12-18 ENCOUNTER — Ambulatory Visit (HOSPITAL_COMMUNITY): Payer: Managed Care, Other (non HMO) | Attending: Internal Medicine

## 2012-12-18 DIAGNOSIS — R0989 Other specified symptoms and signs involving the circulatory and respiratory systems: Secondary | ICD-10-CM | POA: Insufficient documentation

## 2012-12-18 DIAGNOSIS — R0609 Other forms of dyspnea: Secondary | ICD-10-CM | POA: Insufficient documentation

## 2012-12-18 DIAGNOSIS — I10 Essential (primary) hypertension: Secondary | ICD-10-CM | POA: Insufficient documentation

## 2012-12-18 DIAGNOSIS — I428 Other cardiomyopathies: Secondary | ICD-10-CM | POA: Insufficient documentation

## 2012-12-18 DIAGNOSIS — I509 Heart failure, unspecified: Secondary | ICD-10-CM | POA: Insufficient documentation

## 2012-12-18 DIAGNOSIS — R0602 Shortness of breath: Secondary | ICD-10-CM

## 2012-12-18 NOTE — Progress Notes (Signed)
Stress Echocardiogram performed.  

## 2013-03-12 ENCOUNTER — Ambulatory Visit: Payer: Self-pay | Admitting: Nurse Practitioner

## 2013-03-16 ENCOUNTER — Encounter: Payer: Self-pay | Admitting: Nurse Practitioner

## 2013-03-16 ENCOUNTER — Ambulatory Visit (INDEPENDENT_AMBULATORY_CARE_PROVIDER_SITE_OTHER): Payer: Managed Care, Other (non HMO) | Admitting: Nurse Practitioner

## 2013-03-16 VITALS — BP 130/64 | HR 64 | Resp 16 | Ht 59.75 in | Wt 118.0 lb

## 2013-03-16 DIAGNOSIS — Z01419 Encounter for gynecological examination (general) (routine) without abnormal findings: Secondary | ICD-10-CM

## 2013-03-16 NOTE — Progress Notes (Signed)
Patient ID: [name] [name], female   DOB: August 12, 1950, 62 y.o.   MRN: [id number] 62 y.o. [id number] Married Caucasian Fe here for annual exam.   No new diagnosis or problems for this year. Last ECHO 3 years ago normal.  Went to [place] this summer for a bowling tournament.  No LMP recorded. Patient is postmenopausal.          Sexually active: yes  The current method of family planning is post menopausal status.    Exercising: no  The patient does not participate in regular exercise at present. Smoker:  yes  Health Maintenance: Pap:  02/28/12, WNL, neg HR HPV MMG:  03/27/12, BI-Rads 1: negative Colonoscopy:  04/12/11, repeat 10 years BMD:   2013 at PCP osteopenia stable TDaP:  01/23/11 Labs: PCP   reports that she has been smoking Cigarettes.  She has been smoking about 0.30 packs per day. She has never used smokeless tobacco. She reports that she does not drink alcohol or use illicit drugs.  Past Medical History  Diagnosis Date  . Fibromyalgia   . Fibromyalgia   . Congestive heart failure   . Hypertension   . Osteoporosis   . Migraines     controlled with meds; down to once or twice yearly    Past Surgical History  Procedure Laterality Date  . Tonsillectomy      1960  . Back surgery      ruptured disc  . Fibromas removed    . Tubal ligation      Current Outpatient Prescriptions  Medication Sig Dispense Refill  . acetaminophen (TYLENOL) 500 MG tablet Take 500 mg by mouth every 6 (six) hours as needed for pain. Take 2 tablets as needed.      Marland Kitchen amLODipine (NORVASC) 5 MG tablet Take 1 tablet (5 mg total) by mouth daily.  30 tablet  0  . aspirin 81 MG chewable tablet Chew 81 mg by mouth daily.        . B Complex Vitamins (B COMPLEX 1) tablet Take 1 tablet by mouth daily.        Marland Kitchen buPROPion (WELLBUTRIN SR) 150 MG 12 hr tablet Take 150 mg by mouth once.      . calcium carbonate (OS-CAL) 600 MG TABS Take 600 mg by mouth daily.       . carvedilol (COREG) 25 MG tablet Take 25 mg by mouth 2  (two) times daily with a meal.        . Coenzyme Q10 (CO Q 10) 100 MG CAPS Take by mouth 2 (two) times daily.        . cyclobenzaprine (FLEXERIL) 10 MG tablet Take 10 mg by mouth 3 (three) times daily as needed.        . fish oil-omega-3 fatty acids 1000 MG capsule Take 1 g by mouth 2 (two) times daily at 10 AM and 5 PM.        . furosemide (LASIX) 20 MG tablet Take 10 mg by mouth daily.        Marland Kitchen gabapentin (NEURONTIN) 300 MG capsule Take 300 mg by mouth at bedtime.      . ibandronate (BONIVA) 150 MG tablet Take 150 mg by mouth every 30 (thirty) days.       Marland Kitchen KLOR-CON M20 20 MEQ tablet Take 20 mEq by mouth every other day.       . Magnesium Malate POWD by Does not apply route.        . Manganese  Gluconate 50 MG TABS Take by mouth daily.        . Multiple Vitamin (MULTIVITAMIN) tablet Take 1 tablet by mouth daily.        . niacin 500 MG tablet Take 500 mg by mouth daily with breakfast.        . pravastatin (PRAVACHOL) 40 MG tablet Take 40 mg by mouth 3 (three) times a week.        . ramipril (ALTACE) 10 MG capsule Take 1 capsule (10 mg total) by mouth 2 (two) times daily.  60 capsule  0  . SUMAtriptan (IMITREX) 50 MG tablet Take 50 mg by mouth every 2 (two) hours as needed for migraine. Take 0.5 tablet as needed.      . TURMERIC PO Take 1 tablet by mouth daily.      . Vitamin E (E 400 BLENDED PO) Take by mouth 3 (three) times a week.        . zolpidem (AMBIEN) 10 MG tablet        No current facility-administered medications for this visit.    Family History  Problem Relation Age of Onset  . Colon cancer Neg Hx     ROS:  Pertinent items are noted in HPI.  Otherwise, a comprehensive ROS was negative.  Exam:   BP 130/64  Pulse 64  Resp 16  Ht 4' 11.75" (1.518 m)  Wt 118 lb (53.524 kg)  BMI 23.23 kg/m2 Height: 4' 11.75" (151.8 cm)  Ht Readings from Last 3 Encounters:  03/16/13 4' 11.75" (1.518 m)  04/12/11 5' (1.524 m)  03/29/11 5' (1.524 m)    General appearance: alert,  cooperative and appears stated age Head: Normocephalic, without obvious abnormality, atraumatic Neck: no adenopathy, supple, symmetrical, trachea midline and thyroid normal to inspection and palpation Lungs: clear to auscultation bilaterally Breasts: normal appearance, no masses or tenderness Heart: regular rate and rhythm Abdomen: soft, non-tender; no masses,  no organomegaly Extremities: extremities normal, atraumatic, no cyanosis or edema Skin: Skin color, texture, turgor normal. No rashes or lesions Lymph nodes: Cervical, supraclavicular, and axillary nodes normal. No abnormal inguinal nodes palpated Neurologic: Grossly normal   Pelvic: External genitalia:  no lesions              Urethra:  normal appearing urethra with no masses, tenderness or lesions              Bartholin's and Skene's: normal                 Vagina: normal appearing vagina with normal color and discharge, no lesions              Cervix: anteverted              Pap taken: no Bimanual Exam:  Uterus:  normal size, contour, position, consistency, mobility, non-tender              Adnexa: no mass, fullness, tenderness               Rectovaginal: Confirms               Anus:  normal sphincter tone, no lesions  A:  Well Woman with normal exam  Osteopenia - stable on Boniva - PCP manages  Postmenopausal - off HRT 01/2001  Hearing Loss  History of stable lung nodule  P:   Pap smear as per guidelines - Not done  Mammogram due 10/14  Counseled on breast self exam, adequate intake of calcium  and vitamin D, diet and exercise return annually or prn  An After Visit Summary was printed and given to the patient.

## 2013-03-16 NOTE — Patient Instructions (Signed)

## 2013-03-22 NOTE — Progress Notes (Signed)
Encounter reviewed by Dr. Brook Silva.  

## 2013-03-23 ENCOUNTER — Telehealth (HOSPITAL_COMMUNITY): Payer: Self-pay | Admitting: Cardiology

## 2013-03-23 NOTE — Telephone Encounter (Signed)
Attempting to schedule pt for her 3/4 month follow up. Pt does not wish to continue to see CHF clinic as additional fees are associated with visit. Pt would  Like to have a cardiologist follow her at [facility]

## 2013-04-29 ENCOUNTER — Other Ambulatory Visit: Payer: Self-pay

## 2013-05-28 ENCOUNTER — Other Ambulatory Visit: Payer: Self-pay | Admitting: Internal Medicine

## 2013-05-28 DIAGNOSIS — J984 Other disorders of lung: Secondary | ICD-10-CM

## 2013-05-31 ENCOUNTER — Other Ambulatory Visit (HOSPITAL_COMMUNITY): Payer: Self-pay | Admitting: Internal Medicine

## 2013-05-31 DIAGNOSIS — Z1231 Encounter for screening mammogram for malignant neoplasm of breast: Secondary | ICD-10-CM

## 2013-06-04 ENCOUNTER — Ambulatory Visit
Admission: RE | Admit: 2013-06-04 | Discharge: 2013-06-04 | Disposition: A | Discharge status: Home / Self Care | Payer: 59 | Source: Ambulatory Visit | Attending: Internal Medicine | Admitting: Internal Medicine

## 2013-06-04 DIAGNOSIS — J984 Other disorders of lung: Secondary | ICD-10-CM

## 2013-06-04 MED ORDER — IOHEXOL 300 MG/ML  SOLN
75.0000 mL | Freq: Once | INTRAMUSCULAR | Status: AC | PRN
  Administered 2013-06-04: 75 mL via INTRAVENOUS

## 2013-06-25 ENCOUNTER — Ambulatory Visit (HOSPITAL_COMMUNITY)
Admission: RE | Admit: 2013-06-25 | Discharge: 2013-06-25 | Disposition: A | Discharge status: Home / Self Care | Payer: 59 | Source: Ambulatory Visit | Attending: Internal Medicine | Admitting: Internal Medicine

## 2013-06-25 DIAGNOSIS — Z1231 Encounter for screening mammogram for malignant neoplasm of breast: Secondary | ICD-10-CM | POA: Insufficient documentation

## 2014-03-18 ENCOUNTER — Encounter: Payer: Self-pay | Admitting: Nurse Practitioner

## 2014-03-18 ENCOUNTER — Ambulatory Visit (INDEPENDENT_AMBULATORY_CARE_PROVIDER_SITE_OTHER): Payer: 59 | Admitting: Nurse Practitioner

## 2014-03-18 VITALS — BP 126/74 | HR 72 | Ht 60.0 in | Wt 125.0 lb

## 2014-03-18 DIAGNOSIS — Z780 Asymptomatic menopausal state: Secondary | ICD-10-CM

## 2014-03-18 DIAGNOSIS — M949 Disorder of cartilage, unspecified: Secondary | ICD-10-CM

## 2014-03-18 DIAGNOSIS — M899 Disorder of bone, unspecified: Secondary | ICD-10-CM

## 2014-03-18 DIAGNOSIS — Z0000 Encounter for general adult medical examination without abnormal findings: Secondary | ICD-10-CM

## 2014-03-18 DIAGNOSIS — Z01419 Encounter for gynecological examination (general) (routine) without abnormal findings: Secondary | ICD-10-CM

## 2014-03-18 DIAGNOSIS — M8580 Other specified disorders of bone density and structure, unspecified site: Secondary | ICD-10-CM

## 2014-03-18 NOTE — Progress Notes (Signed)
Patient ID: [name] [name], female   DOB: 06-16-1951, 63 y.o.   MRN: [id number] 63 y.o. G3P3003 Married Caucasian Fe here for annual exam.  Going to retire March 1st.  Wants to travel. One son lives in [place] and other son at the [place] [employer] in [place] and visit. 2 grandchildren.  Patient's last menstrual period was 01/22/2001.          Sexually active: yes  The current method of family planning is post menopausal status.  Exercising: no The patient does not participate in regular exercise at present.  Smoker: yes, 2-2.5 packs per week  Health Maintenance:  Pap: 02/28/12, WNL, neg HR HPV  MMG: 06/25/13, BI-Rads 1: negative  Colonoscopy: 04/12/11, normal repeat 10 years  BMD: 2013 at PCP osteopenia stable next BMD scheduled 06/2014 TDaP: 01/23/11  Shingles: done at PCP Pneumovax: done at PCP Labs: PCP     reports that she has been smoking Cigarettes.  She has been smoking about 0.30 packs per day. She has never used smokeless tobacco. She reports that she does not drink alcohol or use illicit drugs.  Past Medical History  Diagnosis Date  . Fibromyalgia   . Fibromyalgia   . Congestive heart failure   . Hypertension   . Osteoporosis   . Migraines     controlled with meds; down to once or twice yearly  . Hearing loss of both ears   . Lung nodules     Past Surgical History  Procedure Laterality Date  . Tonsillectomy      1960  . Back surgery  1986    ruptured disc  . Fibromas removed    . Tubal ligation    . Hysteroscopy  11/98    with polypectomy  . Breast surgery Right 1973    benign tumor    Current Outpatient Prescriptions  Medication Sig Dispense Refill  . acetaminophen (TYLENOL) 500 MG tablet Take 500 mg by mouth every 6 (six) hours as needed for pain. Take 2 tablets as needed.      Marland Kitchen amLODipine (NORVASC) 5 MG tablet Take 1 tablet (5 mg total) by mouth daily.  30 tablet  0  . aspirin 81 MG chewable tablet Chew 81 mg by mouth daily.        . B Complex Vitamins (B COMPLEX 1) tablet  Take 1 tablet by mouth daily.        Marland Kitchen buPROPion (WELLBUTRIN SR) 150 MG 12 hr tablet Take 150 mg by mouth once.      . calcium carbonate (OS-CAL) 600 MG TABS Take 600 mg by mouth daily.       . carvedilol (COREG) 25 MG tablet Take 25 mg by mouth 2 (two) times daily with a meal.        . Coenzyme Q10 (CO Q 10) 100 MG CAPS Take by mouth 2 (two) times daily.        . cyclobenzaprine (FLEXERIL) 10 MG tablet Take 10 mg by mouth 3 (three) times daily as needed.        . fish oil-omega-3 fatty acids 1000 MG capsule Take 1 g by mouth 2 (two) times daily at 10 AM and 5 PM.        . furosemide (LASIX) 20 MG tablet Take 10 mg by mouth daily.        Marland Kitchen gabapentin (NEURONTIN) 300 MG capsule Take 300 mg by mouth at bedtime.      . ibandronate (BONIVA) 150 MG tablet Take 150 mg  by mouth every 30 (thirty) days.       Marland Kitchen KLOR-CON M20 20 MEQ tablet Take 20 mEq by mouth every other day.       . Magnesium Malate POWD by Does not apply route.        . Manganese Gluconate 50 MG TABS Take by mouth daily.        . Multiple Vitamin (MULTIVITAMIN) tablet Take 1 tablet by mouth daily.        . niacin 500 MG tablet Take 500 mg by mouth daily with breakfast.        . ramipril (ALTACE) 10 MG capsule Take 1 capsule (10 mg total) by mouth 2 (two) times daily.  60 capsule  0  . SUMAtriptan (IMITREX) 50 MG tablet Take 50 mg by mouth every 2 (two) hours as needed for migraine. Take 0.5 tablet as needed.      . TURMERIC PO Take 1 tablet by mouth daily.      . Vitamin E (E 400 BLENDED PO) Take by mouth 3 (three) times a week.        . zolpidem (AMBIEN) 10 MG tablet Take 10 mg by mouth at bedtime as needed.       . pravastatin (PRAVACHOL) 40 MG tablet Take 40 mg by mouth 3 (three) times a week.         No current facility-administered medications for this visit.    Family History  Problem Relation Age of Onset  . Colon cancer Neg Hx   . Hypertension Mother   . Hypertension Father     ROS:  Pertinent items are noted in HPI.   Otherwise, a comprehensive ROS was negative.  Exam:   BP 126/74  Pulse 72  Ht 5' (1.524 m)  Wt 125 lb (56.7 kg)  BMI 24.41 kg/m2  LMP 01/22/2001 Height: 5' (152.4 cm)  Ht Readings from Last 3 Encounters:  03/18/14 5' (1.524 m)  03/16/13 4' 11.75" (1.518 m)  04/12/11 5' (1.524 m)    General appearance: alert, cooperative and appears stated age Head: Normocephalic, without obvious abnormality, atraumatic Neck: no adenopathy, supple, symmetrical, trachea midline and thyroid normal to inspection and palpation Lungs: clear to auscultation bilaterally Breasts: normal appearance, no masses or tenderness Heart: regular rate and rhythm Abdomen: soft, non-tender; no masses,  no organomegaly Extremities: extremities normal, atraumatic, no cyanosis or edema Skin: Skin color, texture, turgor normal. No rashes or lesions Lymph nodes: Cervical, supraclavicular, and axillary nodes normal. No abnormal inguinal nodes palpated Neurologic: Grossly normal   Pelvic: External genitalia:  no lesions              Urethra:  normal appearing urethra with no masses, tenderness or lesions              Bartholin's and Skene's: normal                 Vagina: normal appearing vagina with normal color and discharge, no lesions              Cervix: anteverted              Pap taken: Yes.   Bimanual Exam:  Uterus:  normal size, contour, position, consistency, mobility, non-tender              Adnexa: no mass, fullness, tenderness               Rectovaginal: Confirms  Anus:  normal sphincter tone, no lesions  A:  Well Woman with normal exam  Osteopenia - stable on Boniva - PCP manages - for repeat BMD in January  Postmenopausal - off HRT 01/2001   Hearing Loss   History of stable lung nodule    P:   Reviewed health and wellness pertinent to exam  Pap smear taken today  Mammogram is due 06/2014  Counseled on breast self exam, mammography screening, adequate intake of calcium and vitamin D,  diet and exercise, Kegel's exercises return annually or prn  An After Visit Summary was printed and given to the patient.

## 2014-03-18 NOTE — Patient Instructions (Signed)

## 2014-03-19 NOTE — Progress Notes (Signed)
Encounter reviewed by Dr. Brook Silva.  

## 2014-03-23 LAB — IPS PAP TEST WITH HPV

## 2014-04-25 ENCOUNTER — Encounter: Payer: Self-pay | Admitting: Nurse Practitioner

## 2014-10-17 ENCOUNTER — Telehealth: Payer: Self-pay | Admitting: Nurse Practitioner

## 2014-10-17 NOTE — Telephone Encounter (Signed)
LMTCB about canceled appointment °

## 2014-12-05 DIAGNOSIS — R42 Dizziness and giddiness: Secondary | ICD-10-CM | POA: Diagnosis not present

## 2014-12-05 DIAGNOSIS — Z6824 Body mass index (BMI) 24.0-24.9, adult: Secondary | ICD-10-CM | POA: Diagnosis not present

## 2014-12-05 DIAGNOSIS — I959 Hypotension, unspecified: Secondary | ICD-10-CM | POA: Diagnosis not present

## 2014-12-16 DIAGNOSIS — E785 Hyperlipidemia, unspecified: Secondary | ICD-10-CM | POA: Diagnosis not present

## 2014-12-19 ENCOUNTER — Other Ambulatory Visit: Payer: Self-pay

## 2014-12-19 ENCOUNTER — Other Ambulatory Visit (HOSPITAL_COMMUNITY): Payer: Self-pay | Admitting: Internal Medicine

## 2014-12-19 DIAGNOSIS — Z1231 Encounter for screening mammogram for malignant neoplasm of breast: Secondary | ICD-10-CM

## 2014-12-29 ENCOUNTER — Ambulatory Visit (HOSPITAL_COMMUNITY)
Admission: RE | Admit: 2014-12-29 | Discharge: 2014-12-29 | Disposition: A | Discharge status: Home / Self Care | Payer: Medicare Other | Source: Ambulatory Visit | Attending: Internal Medicine | Admitting: Internal Medicine

## 2014-12-29 DIAGNOSIS — Z1231 Encounter for screening mammogram for malignant neoplasm of breast: Secondary | ICD-10-CM | POA: Insufficient documentation

## 2015-03-24 ENCOUNTER — Ambulatory Visit: Payer: Self-pay | Admitting: Nurse Practitioner

## 2015-03-27 ENCOUNTER — Ambulatory Visit (INDEPENDENT_AMBULATORY_CARE_PROVIDER_SITE_OTHER): Payer: Medicare Other | Admitting: Nurse Practitioner

## 2015-03-27 ENCOUNTER — Encounter: Payer: Self-pay | Admitting: Nurse Practitioner

## 2015-03-27 VITALS — BP 140/74 | HR 64 | Ht 60.0 in | Wt 128.0 lb

## 2015-03-27 DIAGNOSIS — M8580 Other specified disorders of bone density and structure, unspecified site: Secondary | ICD-10-CM

## 2015-03-27 DIAGNOSIS — Z01419 Encounter for gynecological examination (general) (routine) without abnormal findings: Secondary | ICD-10-CM | POA: Diagnosis not present

## 2015-03-27 DIAGNOSIS — Z124 Encounter for screening for malignant neoplasm of cervix: Secondary | ICD-10-CM | POA: Diagnosis not present

## 2015-03-27 NOTE — Progress Notes (Signed)
Patient ID: [name] [name], female   DOB: [birth year], 64 y.o.   MRN: [id number] 64 y.o. G41P3003 Married  Caucasian Fe here for annual exam.  No new health problems.  Husband with some health problems. They are now estranged with one of the sons since August.  Patient's last menstrual period was 01/22/2001 (approximate).          Sexually active: No.  The current method of family planning is none.    Exercising: Yes.    Gym/ health club routine includes stationary bike and weights twice a week.  Also does yoga on Friday and bowls on three leagues. Smoker:  Former, quit on 12/20/14  Health Maintenance: Pap: 03/18/14, negative with neg HR HPV  MMG: 12/29/14, 3D, BI-Rads 1: Negative  Colonoscopy: 04/12/11, normal repeat 10 years  BMD: 2016 at PCP  TDaP: 01/23/11  Shingles: done at PCP Pneumovax: done at PCP Labs: PCP   reports that she quit smoking about 3 months ago. Her smoking use included Cigarettes. She smoked 0.30 packs per day. She has never used smokeless tobacco. She reports that she does not drink alcohol or use illicit drugs.  Past Medical History  Diagnosis Date  . Fibromyalgia   . Fibromyalgia   . Congestive heart failure (HCC)   . Hypertension   . Osteoporosis   . Migraines     controlled with meds; down to once or twice yearly  . Hearing loss of both ears   . Lung nodules     Past Surgical History  Procedure Laterality Date  . Tonsillectomy      1960  . Back surgery  1986    ruptured disc  . Fibromas removed    . Tubal ligation    . Hysteroscopy  11/98    with polypectomy  . Breast surgery Right 1973    benign tumor    Current Outpatient Prescriptions  Medication Sig Dispense Refill  . acetaminophen (TYLENOL) 500 MG tablet Take 500 mg by mouth every 6 (six) hours as needed for pain. Take 2 tablets as needed.    Marland Kitchen amLODipine (NORVASC) 5 MG tablet Take 1 tablet (5 mg total) by mouth daily. 30 tablet 0  . aspirin 81 MG chewable tablet Chew 81 mg by mouth daily.       . B Complex Vitamins (B COMPLEX 1) tablet Take 1 tablet by mouth daily.      Marland Kitchen buPROPion (WELLBUTRIN SR) 150 MG 12 hr tablet Take 150 mg by mouth once.    . calcium carbonate (OS-CAL) 600 MG TABS Take 600 mg by mouth daily.     . carvedilol (COREG) 25 MG tablet Take 25 mg by mouth 2 (two) times daily with a meal.      . Coenzyme Q10 (CO Q 10) 100 MG CAPS Take by mouth 2 (two) times daily.      . cyclobenzaprine (FLEXERIL) 10 MG tablet Take 10 mg by mouth 3 (three) times daily as needed.      . fish oil-omega-3 fatty acids 1000 MG capsule Take 1 g by mouth 2 (two) times daily at 10 AM and 5 PM.      . furosemide (LASIX) 20 MG tablet Take 10 mg by mouth daily.      Marland Kitchen gabapentin (NEURONTIN) 100 MG capsule Take 200 mg by mouth at bedtime.   11  . KLOR-CON M20 20 MEQ tablet Take 20 mEq by mouth every other day.     . Manganese Gluconate 50  MG TABS Take by mouth daily.      . Multiple Vitamin (MULTIVITAMIN) tablet Take 1 tablet by mouth daily.      . niacin 500 MG tablet Take 500 mg by mouth daily with breakfast.      . pravastatin (PRAVACHOL) 40 MG tablet Take 40 mg by mouth 4 (four) times a week.     . ramipril (ALTACE) 10 MG capsule Take 1 capsule (10 mg total) by mouth 2 (two) times daily. 60 capsule 0  . SUMAtriptan (IMITREX) 50 MG tablet Take 50 mg by mouth every 2 (two) hours as needed for migraine. Take 0.5 tablet as needed.    . TURMERIC PO Take 1 tablet by mouth daily.    . Vitamin E (E 400 BLENDED PO) Take by mouth 3 (three) times a week.      . zolpidem (AMBIEN) 10 MG tablet Take 10 mg by mouth at bedtime as needed.      No current facility-administered medications for this visit.    Family History  Problem Relation Age of Onset  . Colon cancer Neg Hx   . Hypertension Mother   . Hypertension Father     ROS:  Pertinent items are noted in HPI.  Otherwise, a comprehensive ROS was negative.  Exam:   BP 140/74 mmHg  Pulse 64  Ht 5' (1.524 m)  Wt 128 lb (58.06 kg)  BMI 25.00  kg/m2  LMP 01/22/2001 (Approximate) Height: 5' (152.4 cm) Ht Readings from Last 3 Encounters:  03/27/15 5' (1.524 m)  03/18/14 5' (1.524 m)  03/16/13 4' 11.75" (1.518 m)    General appearance: alert, cooperative and appears stated age Head: Normocephalic, without obvious abnormality, atraumatic Neck: no adenopathy, supple, symmetrical, trachea midline and thyroid normal to inspection and palpation Lungs: clear to auscultation bilaterally Breasts: normal appearance, no masses or tenderness Heart: regular rate and rhythm Abdomen: soft, non-tender; no masses,  no organomegaly Extremities: extremities normal, atraumatic, no cyanosis or edema Skin: Skin color, texture, turgor normal. No rashes or lesions Lymph nodes: Cervical, supraclavicular, and axillary nodes normal. No abnormal inguinal nodes palpated Neurologic: Grossly normal   Pelvic: External genitalia:  no lesions              Urethra:  normal appearing urethra with no masses, tenderness or lesions              Bartholin's and Skene's: normal                 Vagina: normal appearing vagina with normal color and discharge, no lesions              Cervix: anteverted              Pap taken: No. Bimanual Exam:  Uterus:  normal size, contour, position, consistency, mobility, non-tender              Adnexa: no mass, fullness, tenderness               Rectovaginal: Confirms               Anus:  normal sphincter tone, no lesions  Chaperone present: no  A:  Well Woman with normal exam  Osteopenia - stable on Boniva - PCP manages  Postmenopausal - off HRT 01/2001  Hearing Loss  History of stable lung nodule, quit smoking 12/20/14  P:   Reviewed health and wellness pertinent to exam  Pap smear as above  Mammogram is due 12/2015  Discussed with pt. that she may want her GYN care at PCP since this is not covered by her insurance.  She may decide to do that later.  Counseled on breast self exam,  mammography screening, adequate intake of calcium and vitamin D, diet and exercise, Kegel's exercises return annually or prn  An After Visit Summary was printed and given to the patient.

## 2015-03-27 NOTE — Patient Instructions (Addendum)

## 2015-03-28 NOTE — Progress Notes (Signed)
Encounter reviewed Jill Jertson, MD   

## 2015-08-09 DIAGNOSIS — E785 Hyperlipidemia, unspecified: Secondary | ICD-10-CM | POA: Diagnosis not present

## 2015-08-09 DIAGNOSIS — N390 Urinary tract infection, site not specified: Secondary | ICD-10-CM | POA: Diagnosis not present

## 2015-08-09 DIAGNOSIS — R8299 Other abnormal findings in urine: Secondary | ICD-10-CM | POA: Diagnosis not present

## 2015-08-09 DIAGNOSIS — M810 Age-related osteoporosis without current pathological fracture: Secondary | ICD-10-CM | POA: Diagnosis not present

## 2015-08-09 DIAGNOSIS — R7301 Impaired fasting glucose: Secondary | ICD-10-CM | POA: Diagnosis not present

## 2015-08-09 DIAGNOSIS — E784 Other hyperlipidemia: Secondary | ICD-10-CM | POA: Diagnosis not present

## 2015-08-09 DIAGNOSIS — I10 Essential (primary) hypertension: Secondary | ICD-10-CM | POA: Diagnosis not present

## 2015-08-09 DIAGNOSIS — Z0000 Encounter for general adult medical examination without abnormal findings: Secondary | ICD-10-CM | POA: Diagnosis not present

## 2015-08-16 DIAGNOSIS — Z0000 Encounter for general adult medical examination without abnormal findings: Secondary | ICD-10-CM | POA: Diagnosis not present

## 2015-08-16 DIAGNOSIS — Z1389 Encounter for screening for other disorder: Secondary | ICD-10-CM | POA: Diagnosis not present

## 2015-08-16 DIAGNOSIS — R808 Other proteinuria: Secondary | ICD-10-CM | POA: Diagnosis not present

## 2015-08-16 DIAGNOSIS — H9193 Unspecified hearing loss, bilateral: Secondary | ICD-10-CM | POA: Diagnosis not present

## 2015-08-16 DIAGNOSIS — N183 Chronic kidney disease, stage 3 (moderate): Secondary | ICD-10-CM | POA: Diagnosis not present

## 2015-08-16 DIAGNOSIS — E784 Other hyperlipidemia: Secondary | ICD-10-CM | POA: Diagnosis not present

## 2015-08-16 DIAGNOSIS — I509 Heart failure, unspecified: Secondary | ICD-10-CM | POA: Diagnosis not present

## 2015-08-16 DIAGNOSIS — R5383 Other fatigue: Secondary | ICD-10-CM | POA: Diagnosis not present

## 2015-08-16 DIAGNOSIS — I10 Essential (primary) hypertension: Secondary | ICD-10-CM | POA: Diagnosis not present

## 2015-08-16 DIAGNOSIS — M5489 Other dorsalgia: Secondary | ICD-10-CM | POA: Diagnosis not present

## 2015-08-16 DIAGNOSIS — R918 Other nonspecific abnormal finding of lung field: Secondary | ICD-10-CM | POA: Diagnosis not present

## 2015-08-16 DIAGNOSIS — Z6825 Body mass index (BMI) 25.0-25.9, adult: Secondary | ICD-10-CM | POA: Diagnosis not present

## 2016-01-31 DIAGNOSIS — Z6824 Body mass index (BMI) 24.0-24.9, adult: Secondary | ICD-10-CM | POA: Diagnosis not present

## 2016-01-31 DIAGNOSIS — I959 Hypotension, unspecified: Secondary | ICD-10-CM | POA: Diagnosis not present

## 2016-01-31 DIAGNOSIS — I509 Heart failure, unspecified: Secondary | ICD-10-CM | POA: Diagnosis not present

## 2016-02-08 ENCOUNTER — Ambulatory Visit (HOSPITAL_COMMUNITY)
Admission: RE | Admit: 2016-02-08 | Discharge: 2016-02-08 | Disposition: A | Discharge status: Home / Self Care | Payer: Medicare Other | Source: Ambulatory Visit | Attending: Cardiology | Admitting: Cardiology

## 2016-02-08 ENCOUNTER — Ambulatory Visit (HOSPITAL_BASED_OUTPATIENT_CLINIC_OR_DEPARTMENT_OTHER): Payer: Medicare Other

## 2016-02-08 ENCOUNTER — Other Ambulatory Visit: Payer: Self-pay

## 2016-02-08 VITALS — BP 130/66 | HR 83 | Resp 18 | Wt 126.2 lb

## 2016-02-08 DIAGNOSIS — I429 Cardiomyopathy, unspecified: Secondary | ICD-10-CM

## 2016-02-08 DIAGNOSIS — M797 Fibromyalgia: Secondary | ICD-10-CM | POA: Insufficient documentation

## 2016-02-08 DIAGNOSIS — R0683 Snoring: Secondary | ICD-10-CM | POA: Insufficient documentation

## 2016-02-08 DIAGNOSIS — Z87891 Personal history of nicotine dependence: Secondary | ICD-10-CM | POA: Diagnosis not present

## 2016-02-08 DIAGNOSIS — R0600 Dyspnea, unspecified: Secondary | ICD-10-CM

## 2016-02-08 DIAGNOSIS — I110 Hypertensive heart disease with heart failure: Secondary | ICD-10-CM | POA: Diagnosis not present

## 2016-02-08 DIAGNOSIS — I428 Other cardiomyopathies: Secondary | ICD-10-CM | POA: Diagnosis not present

## 2016-02-08 DIAGNOSIS — Z7982 Long term (current) use of aspirin: Secondary | ICD-10-CM | POA: Insufficient documentation

## 2016-02-08 DIAGNOSIS — I5022 Chronic systolic (congestive) heart failure: Secondary | ICD-10-CM

## 2016-02-08 DIAGNOSIS — Z79899 Other long term (current) drug therapy: Secondary | ICD-10-CM | POA: Insufficient documentation

## 2016-02-08 LAB — ECHOCARDIOGRAM COMPLETE: WEIGHTICAEL: 2020 [oz_av]

## 2016-02-08 NOTE — Progress Notes (Signed)
Patient ID: [name], female   DOB: April 29, 1951, 65 y.o.   MRN: [id number] PCP: Dr Waynard Edwards  HPI: [name] [name] is a 65 year old woman with a history of hypertension, fibromyalgia, hearing loss, NICM previous ejection fraction of 25-35% 2006 which has recovered -per echocardiogram in July 2009, shows an EF of 50-55%.   LHC 2006 -Severe nonischemic cardiomyopathy of uncertain etiology.  No coronary disease.  obstruction.   She returns for follow up per request of Dr Waynard Edwards due to increased dyspnea and hypotension.   Last week Dr Rowe Robert stopped amlodipine and lisinopril due to lower than normal BP. She has not seen since 2014. SOB vacumming and cleaning the house but still seems active.Walking on the tread mill 2-3 days a week and yoga on other days. Also bowls 2 days a week.  Weight at home 125 pounds. Denies orthopnea/pnd/cp . Snores at night. Taking all medications. Quit smoking 1 year ago.   Labs obtained from Dr Arline Asp 02/01/2016: Creatinine 0.9, Hgb 13.0 TSH 1.90  Labs obtained at Dr Waynard Edwards office 12/08/12   Stress ECHO 2014 Normal   ROS: All systems negative except as listed in HPI, PMH and Problem List.  Past Medical History:  Diagnosis Date  . Congestive heart failure (HCC)   . Fibromyalgia   . Fibromyalgia   . Hearing loss of both ears   . Hypertension   . Lung nodules   . Migraines    controlled with meds; down to once or twice yearly  . Osteoporosis     Current Outpatient Prescriptions  Medication Sig Dispense Refill  . acetaminophen (TYLENOL) 500 MG tablet Take 500 mg by mouth every 6 (six) hours as needed for pain. Take 2 tablets as needed.    Marland Kitchen aspirin 81 MG chewable tablet Chew 81 mg by mouth daily.      . B Complex Vitamins (B COMPLEX 1) tablet Take 1 tablet by mouth daily.      Marland Kitchen buPROPion (WELLBUTRIN SR) 150 MG 12 hr tablet Take 150 mg by mouth daily.     . calcium carbonate (OS-CAL) 600 MG TABS Take 600 mg by mouth daily.     . carvedilol (COREG) 25 MG tablet Take 25 mg by  mouth 2 (two) times daily with a meal.      . Cinnamon 500 MG TABS Take 500 mg by mouth daily.    . Coenzyme Q10 (CO Q 10) 100 MG CAPS Take 100 mg by mouth 2 (two) times daily.     . cyclobenzaprine (FLEXERIL) 10 MG tablet Take 10 mg by mouth daily.     . fish oil-omega-3 fatty acids 1000 MG capsule Take 1 g by mouth 2 (two) times daily at 10 AM and 5 PM.      . furosemide (LASIX) 20 MG tablet Take 10 mg by mouth daily.      Marland Kitchen gabapentin (NEURONTIN) 100 MG capsule Take 200 mg by mouth at bedtime.   11  . KLOR-CON M20 20 MEQ tablet Take 20 mEq by mouth every other day.     . Manganese Gluconate 50 MG TABS Take 50 mg by mouth daily.     . Multiple Vitamin (MULTIVITAMIN) tablet Take 1 tablet by mouth daily.      . pravastatin (PRAVACHOL) 40 MG tablet Take 40 mg by mouth 4 (four) times a week.     . TURMERIC PO Take 1 tablet by mouth daily.    . Vitamin E (E 400 BLENDED PO) Take 400 mg by  mouth daily.     Marland Kitchen. zolpidem (AMBIEN) 10 MG tablet Take 10 mg by mouth at bedtime as needed for sleep.      No current facility-administered medications for this encounter.      PHYSICAL EXAM: Vitals:   02/08/16 1042  BP: 130/66  BP Location: Left Arm  Patient Position: Sitting  Cuff Size: Normal  Pulse: 83  Resp: 18  SpO2: 95%  Weight: 126 lb 4 oz (57.3 kg)    General:  Well appearing. No resp difficulty. Husband present HEENT: normal Neck: supple. JVP 9-10. Carotids 2+ bilaterally; no bruits. No lymphadenopathy or thryomegaly appreciated. Cor: PMI normal. Regular rate & rhythm. No rubs, gallops or murmurs. Lungs: clear Abdomen: soft, nontender, nondistended. No hepatosplenomegaly. No bruits or masses. Good bowel sounds. Extremities: no cyanosis, clubbing, rash, edema Neuro: alert & orientedx3, cranial nerves grossly intact. Moves all 4 extremities w/o difficulty. Affect pleasant.      ASSESSMENT & PLAN: 1. Dyspnea  2. Chronic Systolic Heart Failure- NICM . LHC 2006 with normal cors. EF  recovered in 2014 ~55%.   Now with mild dyspnea. Today she has mild volume overload. Increase lasix 20 mg daily x 2 days then back 10 mg daily On goal dose carvedilol 25 mg twice a day.  Off ace for now with recent lower than normal SBP. Plan to repeat ECHO. IF EF down will need to restart Ace versus ARNi.  I reviewed lab and EKG work from Dr Ford Motor CompanyPerinini. TSH, Hgb ok. EKG ok.  3. Snores- Refer to pulmonary for sleep study. 4. Former Smoker- quit 1 year ago   Follow up in 3 months unless EF down.

## 2016-02-08 NOTE — Patient Instructions (Signed)
Will schedule you for an echocardiogram at [facility]. Address: [address] ([address] Floor), [place], [place] [address]  Phone: [phone]  Take an extra Lasix tablet today and tomorrow, then reduce back to prescribed daily dose of 10 mg once daily.  Follow up with Dr. Gala Romney in 3 months. We will call you closer to this time, or you may call our office to schedule 1 month before you are due to be seen.  Do the following things EVERYDAY: 1) Weigh yourself in the morning before breakfast. Write it down and keep it in a log. 2) Take your medicines as prescribed 3) Eat low salt foods-Limit salt (sodium) to 2000 mg per day.  4) Stay as active as you can everyday 5) Limit all fluids for the day to less than 2 liters

## 2016-02-08 NOTE — Progress Notes (Signed)
Advanced Heart Failure Medication Review by a Pharmacist  Does the patient  feel that his/her medications are working for him/her?  yes  Has the patient been experiencing any side effects to the medications prescribed?  no  Does the patient measure his/her own blood pressure or blood glucose at home?  no   Does the patient have any problems obtaining medications due to transportation or finances?   no  Understanding of regimen: good Understanding of indications: good Potential of compliance: good Patient understands to avoid NSAIDs. Patient understands to avoid decongestants.  Issues to address at subsequent visits: None   Pharmacist comments:  [name] [name] is a pleasant 65 yo F presenting with her husband and without a medication list but with good understanding of her regimen. She reports good compliance with her medications. She did state that her PCP discontinued her amlodipine and ramipril for hypotension with SBP in the 80s. She did not have any other medication-related questions or concerns for me at this time.   [name]. [name], PharmD, BCPS, CPP Clinical Pharmacist Pager: [phone] Phone: [phone] 02/08/2016 10:54 AM      Time with patient: 10 minutes Preparation and documentation time: 2 minutes Total time: 12 minutes

## 2016-02-09 ENCOUNTER — Other Ambulatory Visit (HOSPITAL_COMMUNITY): Payer: Self-pay | Admitting: Internal Medicine

## 2016-05-02 ENCOUNTER — Other Ambulatory Visit: Payer: Self-pay | Admitting: Internal Medicine

## 2016-05-02 DIAGNOSIS — Z1231 Encounter for screening mammogram for malignant neoplasm of breast: Secondary | ICD-10-CM

## 2016-06-04 DIAGNOSIS — R509 Fever, unspecified: Secondary | ICD-10-CM | POA: Diagnosis not present

## 2016-06-05 ENCOUNTER — Encounter (HOSPITAL_COMMUNITY): Payer: Medicare Other | Admitting: Internal Medicine

## 2016-06-06 ENCOUNTER — Ambulatory Visit: Payer: Medicare Other

## 2016-06-14 ENCOUNTER — Ambulatory Visit
Admission: RE | Admit: 2016-06-14 | Discharge: 2016-06-14 | Disposition: A | Discharge status: Home / Self Care | Payer: Medicare Other | Source: Ambulatory Visit | Attending: Internal Medicine | Admitting: Internal Medicine

## 2016-06-14 DIAGNOSIS — Z1231 Encounter for screening mammogram for malignant neoplasm of breast: Secondary | ICD-10-CM

## 2016-07-01 ENCOUNTER — Encounter: Payer: Self-pay | Admitting: Nurse Practitioner

## 2016-07-01 ENCOUNTER — Ambulatory Visit (INDEPENDENT_AMBULATORY_CARE_PROVIDER_SITE_OTHER): Payer: Medicare Other | Admitting: Nurse Practitioner

## 2016-07-01 VITALS — BP 130/76 | HR 84 | Ht 59.5 in | Wt 127.0 lb

## 2016-07-01 DIAGNOSIS — Z01419 Encounter for gynecological examination (general) (routine) without abnormal findings: Secondary | ICD-10-CM | POA: Diagnosis not present

## 2016-07-01 DIAGNOSIS — Z124 Encounter for screening for malignant neoplasm of cervix: Secondary | ICD-10-CM

## 2016-07-01 DIAGNOSIS — M8580 Other specified disorders of bone density and structure, unspecified site: Secondary | ICD-10-CM

## 2016-07-01 DIAGNOSIS — H9193 Unspecified hearing loss, bilateral: Secondary | ICD-10-CM | POA: Diagnosis not present

## 2016-07-01 NOTE — Patient Instructions (Signed)

## 2016-07-01 NOTE — Progress Notes (Signed)
Patient ID: [name] [name], female   DOB: 1950/08/17, 66 y.o.   MRN: [id number]  66 y.o. [id number] Married  Caucasian Fe here for annual exam.  This past year BP started dropping low.  She has been now been on a reduced dose of Norvasc.  Headaches started being bad off Norvasc but better since back on at a lower dose. Sometimes has vaginal itching that is treated with OTC cortisone and Vit E cream.     Husband had to go on insulin last year.  He also has HTN and heart disease.    Patient's last menstrual period was 01/22/2001 (approximate).          Sexually active: No.  The current method of family planning is none.    Exercising: Yes.    Gym/ health club routine includes yoga and eliptical x 15 minutes, weights, treadmill; bowling twice weekly and house chores. Smoker: Former smoker  Health Maintenance: Pap: 03/18/14, negative with neg HR HPV  MMG: 06/14/16, 3D, BI-Rads 1: Negative  Colonoscopy: 04/12/11, normal repeat 10 years  BMD: 2016 at PCP - stable and off Boniva since last BMD TDaP: 01/23/11  Shingles: done at PCP Pneumovax: done at PCP Hep C: 2017 with Dr. Waynard Edwards Labs: PCP takes care of all labs   reports that she quit smoking about 18 months ago. Her smoking use included Cigarettes. She smoked 0.30 packs per day. She has never used smokeless tobacco. She reports that she does not drink alcohol or use drugs.  Past Medical History:  Diagnosis Date  . Congestive heart failure (HCC)   . Fibromyalgia   . Fibromyalgia   . Hearing loss of both ears   . Hypertension   . Lung nodules   . Migraines    controlled with meds; down to once or twice yearly  . Osteoporosis     Past Surgical History:  Procedure Laterality Date  . BACK SURGERY  1986   ruptured disc  . BREAST SURGERY Right 1973   benign tumor  . fibromas removed    . HYSTEROSCOPY  11/98   with polypectomy  . TONSILLECTOMY     1960  . TUBAL LIGATION      Current Outpatient Prescriptions  Medication Sig Dispense  Refill  . acetaminophen (TYLENOL) 500 MG tablet Take 500 mg by mouth every 6 (six) hours as needed for pain. Take 2 tablets as needed.    Marland Kitchen amLODipine (NORVASC) 5 MG tablet Take 1 tablet by mouth daily.  10  . aspirin 81 MG chewable tablet Chew 81 mg by mouth daily.      . B Complex Vitamins (B COMPLEX 1) tablet Take 1 tablet by mouth daily.      Marland Kitchen buPROPion (WELLBUTRIN SR) 150 MG 12 hr tablet Take 150 mg by mouth daily.     . calcium carbonate (OS-CAL) 600 MG TABS Take 600 mg by mouth daily.     . carvedilol (COREG) 25 MG tablet Take 25 mg by mouth 2 (two) times daily with a meal.      . Cinnamon 500 MG TABS Take 500 mg by mouth daily.    . Coenzyme Q10 (CO Q 10) 100 MG CAPS Take 100 mg by mouth 2 (two) times daily.     . cyclobenzaprine (FLEXERIL) 10 MG tablet Take 10 mg by mouth daily.     Marland Kitchen docusate sodium (COLACE) 100 MG capsule Take 100 mg by mouth daily.    . fish oil-omega-3 fatty acids 1000  MG capsule Take 1 g by mouth 2 (two) times daily at 10 AM and 5 PM.      . furosemide (LASIX) 20 MG tablet Take 10 mg by mouth daily.      Marland Kitchen gabapentin (NEURONTIN) 100 MG capsule Take 200 mg by mouth at bedtime.   11  . KLOR-CON M20 20 MEQ tablet Take 20 mEq by mouth every other day.     . Manganese Gluconate 50 MG TABS Take 50 mg by mouth daily.     . Multiple Vitamin (MULTIVITAMIN) tablet Take 1 tablet by mouth daily.      . pravastatin (PRAVACHOL) 40 MG tablet Take 40 mg by mouth 4 (four) times a week.     . SUMAtriptan (IMITREX) 50 MG tablet TK 1/2 TO 1 T PO PRF MIGRAINES  3  . TURMERIC PO Take 1 tablet by mouth daily.    . Vitamin E (E 400 BLENDED PO) Take 400 mg by mouth daily.     Marland Kitchen zolpidem (AMBIEN) 10 MG tablet Take 10 mg by mouth at bedtime as needed for sleep.      No current facility-administered medications for this visit.     Family History  Problem Relation Age of Onset  . Hypertension Mother   . Hypertension Father   . Colon cancer Neg Hx     ROS:  Pertinent items are  noted in HPI.  Otherwise, a comprehensive ROS was negative.  Exam:   BP 130/76 (BP Location: Right Arm, Patient Position: Sitting, Cuff Size: Normal)   Pulse 84   Ht 4' 11.5" (1.511 m)   Wt 127 lb (57.6 kg)   LMP 01/22/2001 (Approximate)   BMI 25.22 kg/m  Height: 4' 11.5" (151.1 cm) Ht Readings from Last 3 Encounters:  07/01/16 4' 11.5" (1.511 m)  03/27/15 5' (1.524 m)  03/18/14 5' (1.524 m)    General appearance: alert, cooperative and appears stated age Head: Normocephalic, without obvious abnormality, atraumatic Neck: no adenopathy, supple, symmetrical, trachea midline and thyroid normal to inspection and palpation Lungs: clear to auscultation bilaterally Breasts: normal appearance, no masses or tenderness Heart: regular rate and rhythm Abdomen: soft, non-tender; no masses,  no organomegaly Extremities: extremities normal, atraumatic, no cyanosis or edema Skin: Skin color, texture, turgor normal. No rashes or lesions Lymph nodes: Cervical, supraclavicular, and axillary nodes normal. No abnormal inguinal nodes palpated Neurologic: Grossly normal   Pelvic: External genitalia:  no lesions              Urethra:  normal appearing urethra with no masses, tenderness or lesions              Bartholin's and Skene's: normal                 Vagina: normal appearing vagina with normal color and discharge, no lesions              Cervix: anteverted              Pap taken: No. Bimanual Exam:  Uterus:  normal size, contour, position, consistency, mobility, non-tender              Adnexa: no mass, fullness, tenderness               Rectovaginal: Confirms               Anus:  normal sphincter tone, no lesions  Chaperone present: yes  A:  Well Woman with normal exam  Osteopenia -  stable on Boniva - PCP manages  Postmenopausal - off HRT 01/2001  Hearing Loss  History of stable lung nodule, quit smoking 12/20/14   P:   Reviewed health and wellness  pertinent to exam  Pap smear not done - wants pap every 5 yrs  Mammogram is due 05/2017  Counseled on breast self exam, mammography screening, osteoporosis, adequate intake of calcium and vitamin D, diet and exercise, Kegel's exercises return annually or prn Will get ROI for BMD and immunizations from PCP  An After Visit Summary was printed and given to the patient.

## 2016-07-04 DIAGNOSIS — H52203 Unspecified astigmatism, bilateral: Secondary | ICD-10-CM | POA: Diagnosis not present

## 2016-07-04 DIAGNOSIS — H2513 Age-related nuclear cataract, bilateral: Secondary | ICD-10-CM | POA: Diagnosis not present

## 2016-07-07 NOTE — Progress Notes (Signed)
Encounter reviewed by Dr. Shonette Rhames Amundson C. Silva.  

## 2016-07-10 ENCOUNTER — Encounter (HOSPITAL_COMMUNITY): Payer: Medicare Other | Admitting: Internal Medicine

## 2016-08-21 DIAGNOSIS — E785 Hyperlipidemia, unspecified: Secondary | ICD-10-CM | POA: Diagnosis not present

## 2016-08-21 DIAGNOSIS — N183 Chronic kidney disease, stage 3 (moderate): Secondary | ICD-10-CM | POA: Diagnosis not present

## 2016-08-21 DIAGNOSIS — I10 Essential (primary) hypertension: Secondary | ICD-10-CM | POA: Diagnosis not present

## 2016-08-21 DIAGNOSIS — M810 Age-related osteoporosis without current pathological fracture: Secondary | ICD-10-CM | POA: Diagnosis not present

## 2016-08-22 ENCOUNTER — Ambulatory Visit (HOSPITAL_COMMUNITY)
Admission: RE | Admit: 2016-08-22 | Discharge: 2016-08-22 | Disposition: A | Discharge status: Home / Self Care | Payer: Medicare Other | Source: Ambulatory Visit | Attending: Internal Medicine | Admitting: Internal Medicine

## 2016-08-22 ENCOUNTER — Encounter (HOSPITAL_COMMUNITY): Payer: Self-pay | Admitting: Internal Medicine

## 2016-08-22 VITALS — BP 134/70 | HR 83 | Wt 130.0 lb

## 2016-08-22 DIAGNOSIS — I110 Hypertensive heart disease with heart failure: Secondary | ICD-10-CM | POA: Diagnosis not present

## 2016-08-22 DIAGNOSIS — Z79899 Other long term (current) drug therapy: Secondary | ICD-10-CM | POA: Diagnosis not present

## 2016-08-22 DIAGNOSIS — H9193 Unspecified hearing loss, bilateral: Secondary | ICD-10-CM | POA: Diagnosis not present

## 2016-08-22 DIAGNOSIS — I5032 Chronic diastolic (congestive) heart failure: Secondary | ICD-10-CM | POA: Diagnosis not present

## 2016-08-22 DIAGNOSIS — Z87891 Personal history of nicotine dependence: Secondary | ICD-10-CM | POA: Insufficient documentation

## 2016-08-22 DIAGNOSIS — Z7982 Long term (current) use of aspirin: Secondary | ICD-10-CM | POA: Insufficient documentation

## 2016-08-22 DIAGNOSIS — R0683 Snoring: Secondary | ICD-10-CM | POA: Diagnosis not present

## 2016-08-22 DIAGNOSIS — I428 Other cardiomyopathies: Secondary | ICD-10-CM | POA: Diagnosis not present

## 2016-08-22 DIAGNOSIS — M797 Fibromyalgia: Secondary | ICD-10-CM | POA: Insufficient documentation

## 2016-08-22 DIAGNOSIS — M810 Age-related osteoporosis without current pathological fracture: Secondary | ICD-10-CM | POA: Diagnosis not present

## 2016-08-22 NOTE — Progress Notes (Signed)
ADVANCED HF CLINIC NOTE  Patient ID: [name] [name], female   DOB: 1950-10-16, 66 y.o.   MRN: [id number] PCP: Dr Waynard Edwards  HPI: [name] [name] is a 66 year old smoke (quite 2016) with a history of hypertension, fibromyalgia, hearing loss, NICM previous ejection fraction of 25-35% 2006 which has recovered -per echocardiogram in July 2009, shows an EF of 50-55%.   LHC 2006 -Severe nonischemic cardiomyopathy of uncertain etiology.  No coronary disease.  obstruction.  Stress ECHO 2014 Normal   She was last seen in 8/17 after a 2 year absence from the HF Clinic. She was more dyspneic at that time. Lasix was increased from 10mg  daily to 20 mg daily for a week then cut back down.  Echo in 8/17 EF 55% with grade I DD  Returns for f/u. Still bowling 2x/week. Denies dyspnea, orthopnea or PND. Taking lasix 10mg  daily and goes to 20 as needed. Working out 2x/week at [employer].    Labs obtained from Dr Arline Asp 02/01/2016: Creatinine 0.9, Hgb 13.0 TSH 1.90     ROS: All systems negative except as listed in HPI, PMH and Problem List.  Past Medical History:  Diagnosis Date  . Congestive heart failure (HCC)   . Fibromyalgia   . Fibromyalgia   . Hearing loss of both ears   . Hypertension   . Lung nodules   . Migraines    controlled with meds; down to once or twice yearly  . Osteoporosis     Current Outpatient Prescriptions  Medication Sig Dispense Refill  . acetaminophen (TYLENOL) 500 MG tablet Take 500 mg by mouth every 6 (six) hours as needed for pain. Take 2 tablets as needed.    Marland Kitchen amLODipine (NORVASC) 5 MG tablet Take 1 tablet by mouth daily.  10  . aspirin 81 MG chewable tablet Chew 81 mg by mouth daily.      . B Complex Vitamins (B COMPLEX 1) tablet Take 1 tablet by mouth daily.      Marland Kitchen buPROPion (WELLBUTRIN SR) 150 MG 12 hr tablet Take 150 mg by mouth daily.     . calcium carbonate (OS-CAL) 600 MG TABS Take 600 mg by mouth daily.     . carvedilol (COREG) 25 MG tablet Take 25 mg by mouth 2 (two)  times daily with a meal.      . Cinnamon 500 MG TABS Take 500 mg by mouth daily.    . Coenzyme Q10 (CO Q 10) 100 MG CAPS Take 100 mg by mouth 2 (two) times daily.     . cyclobenzaprine (FLEXERIL) 10 MG tablet Take 10 mg by mouth daily.     Marland Kitchen docusate sodium (COLACE) 100 MG capsule Take 100 mg by mouth daily.    . fish oil-omega-3 fatty acids 1000 MG capsule Take 1 g by mouth 2 (two) times daily at 10 AM and 5 PM.      . furosemide (LASIX) 20 MG tablet Take 10 mg by mouth daily.      Marland Kitchen gabapentin (NEURONTIN) 100 MG capsule Take 200 mg by mouth at bedtime.   11  . KLOR-CON M20 20 MEQ tablet Take 20 mEq by mouth every other day.     . Manganese Gluconate 50 MG TABS Take 50 mg by mouth daily.     . Multiple Vitamin (MULTIVITAMIN) tablet Take 1 tablet by mouth daily.      . pravastatin (PRAVACHOL) 40 MG tablet Take 40 mg by mouth 4 (four) times a week.     Marland Kitchen  SUMAtriptan (IMITREX) 50 MG tablet TK 1/2 TO 1 T PO PRF MIGRAINES  3  . TURMERIC PO Take 1 tablet by mouth daily.    . Vitamin E (E 400 BLENDED PO) Take 400 mg by mouth daily.     Marland Kitchen zolpidem (AMBIEN) 10 MG tablet Take 10 mg by mouth at bedtime as needed for sleep.      No current facility-administered medications for this encounter.      PHYSICAL EXAM: Vitals:   08/22/16 1430  BP: 134/70  Pulse: 83  SpO2: 100%  Weight: 130 lb (59 kg)    General:  Well appearing. No resp difficulty. Husband present HEENT: normal Neck: supple. JVP 6-7 Carotids 2+ bilaterally; no bruits. No lymphadenopathy or thryomegaly appreciated. Cor: PMI normal. Regular rate & rhythm. No rubs, gallops or murmurs. Lungs: clear Abdomen: soft, nontender, nondistended. No hepatosplenomegaly. No bruits or masses. Good bowel sounds. Extremities: no cyanosis, clubbing, rash, edema Neuro: alert & orientedx3, cranial nerves grossly intact. Moves all 4 extremities w/o difficulty. Affect pleasant.   ASSESSMENT & PLAN: 1.. Chronic Daistolic Heart Failure- Previous NICM  . LHC 2006 with normal cors. EF recovered in 2014 ~55%.  Last echo 8/17 EF 55% --Doing very well. Colume status looks good.  --Reinforced need for daily weights and reviewed use of sliding scale diuretics. 2. Snoring:  -Refuses sleep study 3. Former Smoker - quit 1 year ago   [name] Burich,MD 2:17 PM

## 2016-08-22 NOTE — Patient Instructions (Signed)
Follow up with Dr.Bensimhon in 12 months  

## 2016-08-28 DIAGNOSIS — I509 Heart failure, unspecified: Secondary | ICD-10-CM | POA: Diagnosis not present

## 2016-08-28 DIAGNOSIS — E784 Other hyperlipidemia: Secondary | ICD-10-CM | POA: Diagnosis not present

## 2016-08-28 DIAGNOSIS — M5489 Other dorsalgia: Secondary | ICD-10-CM | POA: Diagnosis not present

## 2016-08-28 DIAGNOSIS — I10 Essential (primary) hypertension: Secondary | ICD-10-CM | POA: Diagnosis not present

## 2016-08-28 DIAGNOSIS — R918 Other nonspecific abnormal finding of lung field: Secondary | ICD-10-CM | POA: Diagnosis not present

## 2016-08-28 DIAGNOSIS — H9193 Unspecified hearing loss, bilateral: Secondary | ICD-10-CM | POA: Diagnosis not present

## 2016-08-28 DIAGNOSIS — R7301 Impaired fasting glucose: Secondary | ICD-10-CM | POA: Diagnosis not present

## 2016-08-28 DIAGNOSIS — M810 Age-related osteoporosis without current pathological fracture: Secondary | ICD-10-CM | POA: Diagnosis not present

## 2016-08-28 DIAGNOSIS — Z1389 Encounter for screening for other disorder: Secondary | ICD-10-CM | POA: Diagnosis not present

## 2016-08-28 DIAGNOSIS — Z0000 Encounter for general adult medical examination without abnormal findings: Secondary | ICD-10-CM | POA: Diagnosis not present

## 2016-08-28 DIAGNOSIS — Z6823 Body mass index (BMI) 23.0-23.9, adult: Secondary | ICD-10-CM | POA: Diagnosis not present

## 2016-08-28 DIAGNOSIS — N183 Chronic kidney disease, stage 3 (moderate): Secondary | ICD-10-CM | POA: Diagnosis not present

## 2016-09-03 DIAGNOSIS — H25811 Combined forms of age-related cataract, right eye: Secondary | ICD-10-CM | POA: Diagnosis not present

## 2016-09-03 DIAGNOSIS — H2511 Age-related nuclear cataract, right eye: Secondary | ICD-10-CM | POA: Diagnosis not present

## 2016-09-05 DIAGNOSIS — Z1212 Encounter for screening for malignant neoplasm of rectum: Secondary | ICD-10-CM | POA: Diagnosis not present

## 2016-09-30 DIAGNOSIS — M810 Age-related osteoporosis without current pathological fracture: Secondary | ICD-10-CM | POA: Diagnosis not present

## 2016-09-30 DIAGNOSIS — N183 Chronic kidney disease, stage 3 (moderate): Secondary | ICD-10-CM | POA: Diagnosis not present

## 2016-10-15 DIAGNOSIS — H2512 Age-related nuclear cataract, left eye: Secondary | ICD-10-CM | POA: Diagnosis not present

## 2016-10-15 DIAGNOSIS — H25812 Combined forms of age-related cataract, left eye: Secondary | ICD-10-CM | POA: Diagnosis not present

## 2016-11-14 ENCOUNTER — Emergency Department (HOSPITAL_COMMUNITY): Payer: Medicare Other

## 2016-11-14 ENCOUNTER — Encounter (HOSPITAL_COMMUNITY): Payer: Self-pay | Admitting: Emergency Medicine

## 2016-11-14 ENCOUNTER — Observation Stay (HOSPITAL_COMMUNITY)
Admission: EM | Admit: 2016-11-14 | Discharge: 2016-11-15 | Disposition: A | Discharge status: Home / Self Care | Payer: Medicare Other | Attending: Internal Medicine | Admitting: Internal Medicine

## 2016-11-14 DIAGNOSIS — Z7982 Long term (current) use of aspirin: Secondary | ICD-10-CM | POA: Insufficient documentation

## 2016-11-14 DIAGNOSIS — R55 Syncope and collapse: Secondary | ICD-10-CM | POA: Diagnosis not present

## 2016-11-14 DIAGNOSIS — R42 Dizziness and giddiness: Secondary | ICD-10-CM | POA: Insufficient documentation

## 2016-11-14 DIAGNOSIS — M810 Age-related osteoporosis without current pathological fracture: Secondary | ICD-10-CM | POA: Diagnosis not present

## 2016-11-14 DIAGNOSIS — I517 Cardiomegaly: Secondary | ICD-10-CM | POA: Diagnosis not present

## 2016-11-14 DIAGNOSIS — I110 Hypertensive heart disease with heart failure: Secondary | ICD-10-CM | POA: Diagnosis not present

## 2016-11-14 DIAGNOSIS — M797 Fibromyalgia: Secondary | ICD-10-CM | POA: Insufficient documentation

## 2016-11-14 DIAGNOSIS — I5032 Chronic diastolic (congestive) heart failure: Secondary | ICD-10-CM | POA: Diagnosis not present

## 2016-11-14 DIAGNOSIS — I420 Dilated cardiomyopathy: Secondary | ICD-10-CM

## 2016-11-14 DIAGNOSIS — Z87891 Personal history of nicotine dependence: Secondary | ICD-10-CM | POA: Insufficient documentation

## 2016-11-14 DIAGNOSIS — Z882 Allergy status to sulfonamides status: Secondary | ICD-10-CM | POA: Insufficient documentation

## 2016-11-14 DIAGNOSIS — R404 Transient alteration of awareness: Secondary | ICD-10-CM | POA: Diagnosis not present

## 2016-11-14 DIAGNOSIS — I429 Cardiomyopathy, unspecified: Secondary | ICD-10-CM | POA: Insufficient documentation

## 2016-11-14 DIAGNOSIS — Z9851 Tubal ligation status: Secondary | ICD-10-CM | POA: Insufficient documentation

## 2016-11-14 DIAGNOSIS — I10 Essential (primary) hypertension: Secondary | ICD-10-CM | POA: Diagnosis not present

## 2016-11-14 LAB — I-STAT TROPONIN, ED: Troponin i, poc: 0.01 ng/mL (ref 0.00–0.08)

## 2016-11-14 LAB — CBC WITH DIFFERENTIAL/PLATELET
BASOS ABS: 0 10*3/uL (ref 0.0–0.1)
Basophils Relative: 0 %
EOS PCT: 1 %
Eosinophils Absolute: 0.1 10*3/uL (ref 0.0–0.7)
HCT: 36.2 % (ref 36.0–46.0)
Hemoglobin: 12 g/dL (ref 12.0–15.0)
LYMPHS ABS: 1.2 10*3/uL (ref 0.7–4.0)
LYMPHS PCT: 15 %
MCH: 31.2 pg (ref 26.0–34.0)
MCHC: 33.1 g/dL (ref 30.0–36.0)
MCV: 94 fL (ref 78.0–100.0)
MONO ABS: 0.5 10*3/uL (ref 0.1–1.0)
MONOS PCT: 6 %
Neutro Abs: 6.2 10*3/uL (ref 1.7–7.7)
Neutrophils Relative %: 78 %
PLATELETS: 203 10*3/uL (ref 150–400)
RBC: 3.85 MIL/uL — ABNORMAL LOW (ref 3.87–5.11)
RDW: 12.7 % (ref 11.5–15.5)
WBC: 8 10*3/uL (ref 4.0–10.5)

## 2016-11-14 LAB — BASIC METABOLIC PANEL
Anion gap: 8 (ref 5–15)
BUN: 15 mg/dL (ref 6–20)
CHLORIDE: 105 mmol/L (ref 101–111)
CO2: 27 mmol/L (ref 22–32)
Calcium: 9.2 mg/dL (ref 8.9–10.3)
Creatinine, Ser: 1 mg/dL (ref 0.44–1.00)
GFR calc Af Amer: >60 mL/min (ref >60)
GFR calc non Af Amer: 57 mL/min — ABNORMAL LOW (ref >60)
GLUCOSE: 112 mg/dL — AB (ref 65–99)
POTASSIUM: 3.9 mmol/L (ref 3.5–5.1)
Sodium: 140 mmol/L (ref 135–145)

## 2016-11-14 LAB — URINALYSIS, ROUTINE W REFLEX MICROSCOPIC
Bilirubin Urine: NEGATIVE
Glucose, UA: NEGATIVE mg/dL
Hgb urine dipstick: NEGATIVE
Ketones, ur: NEGATIVE mg/dL
Leukocytes, UA: NEGATIVE
NITRITE: NEGATIVE
PH: 6 (ref 5.0–8.0)
Protein, ur: NEGATIVE mg/dL
SPECIFIC GRAVITY, URINE: 1.006 (ref 1.005–1.030)

## 2016-11-14 MED ORDER — AMLODIPINE BESYLATE 2.5 MG PO TABS
2.5000 mg | ORAL_TABLET | Freq: Every day | ORAL | Status: DC
  Administered 2016-11-14: 2.5 mg via ORAL
  Filled 2016-11-14 (×2): qty 1

## 2016-11-14 MED ORDER — ACETAMINOPHEN 500 MG PO TABS: 500.0000 mg | ORAL_TABLET | Freq: Four times a day (QID) | ORAL | Status: DC | PRN

## 2016-11-14 MED ORDER — SODIUM CHLORIDE 0.9 % IV BOLUS (SEPSIS): 1000.0000 mL | Freq: Once | INTRAVENOUS | Status: DC

## 2016-11-14 MED ORDER — ZOLPIDEM TARTRATE 5 MG PO TABS: 5.0000 mg | ORAL_TABLET | Freq: Every evening | ORAL | Status: DC | PRN

## 2016-11-14 MED ORDER — ASPIRIN 81 MG PO CHEW
81.0000 mg | CHEWABLE_TABLET | Freq: Every day | ORAL | Status: DC
  Administered 2016-11-14: 81 mg via ORAL
  Filled 2016-11-14 (×2): qty 1

## 2016-11-14 MED ORDER — BUPROPION HCL ER (SR) 150 MG PO TB12
150.0000 mg | ORAL_TABLET | Freq: Every day | ORAL | Status: DC
  Administered 2016-11-15: 150 mg via ORAL
  Filled 2016-11-14 (×2): qty 1

## 2016-11-14 MED ORDER — SODIUM CHLORIDE 0.9 % IV BOLUS (SEPSIS)
500.0000 mL | Freq: Once | INTRAVENOUS | Status: AC
  Administered 2016-11-14: 500 mL via INTRAVENOUS

## 2016-11-14 MED ORDER — SODIUM CHLORIDE 0.9% FLUSH
3.0000 mL | Freq: Two times a day (BID) | INTRAVENOUS | Status: DC
  Administered 2016-11-14 – 2016-11-15 (×2): 3 mL via INTRAVENOUS

## 2016-11-14 MED ORDER — PRAVASTATIN SODIUM 40 MG PO TABS
40.0000 mg | ORAL_TABLET | ORAL | Status: DC
  Administered 2016-11-14: 40 mg via ORAL
  Filled 2016-11-14: qty 1

## 2016-11-14 MED ORDER — OMEGA-3-ACID ETHYL ESTERS 1 G PO CAPS
1.0000 g | ORAL_CAPSULE | Freq: Two times a day (BID) | ORAL | Status: DC
  Administered 2016-11-15: 1 g via ORAL
  Filled 2016-11-14: qty 1

## 2016-11-14 MED ORDER — CARVEDILOL 12.5 MG PO TABS
12.5000 mg | ORAL_TABLET | Freq: Two times a day (BID) | ORAL | Status: DC
  Administered 2016-11-15: 12.5 mg via ORAL
  Filled 2016-11-14: qty 1

## 2016-11-14 MED ORDER — CALCIUM CARBONATE 1250 (500 CA) MG PO TABS
1250.0000 mg | ORAL_TABLET | Freq: Every day | ORAL | Status: DC
  Administered 2016-11-15: 1250 mg via ORAL
  Filled 2016-11-14: qty 1

## 2016-11-14 MED ORDER — SODIUM CHLORIDE 0.9 % IV SOLN
INTRAVENOUS | Status: AC
  Administered 2016-11-14: 21:00:00 via INTRAVENOUS

## 2016-11-14 MED ORDER — B COMPLEX-C PO TABS
1.0000 | ORAL_TABLET | Freq: Every day | ORAL | Status: DC
  Administered 2016-11-15: 1 via ORAL
  Filled 2016-11-14 (×3): qty 1

## 2016-11-14 MED ORDER — CO Q 10 100 MG PO CAPS: 100.0000 mg | ORAL_CAPSULE | Freq: Two times a day (BID) | ORAL | Status: DC

## 2016-11-14 MED ORDER — CINNAMON 500 MG PO TABS: 500.0000 mg | ORAL_TABLET | Freq: Every day | ORAL | Status: DC

## 2016-11-14 MED ORDER — MANGANESE GLUCONATE 50 MG PO TABS: 50.0000 mg | ORAL_TABLET | Freq: Every day | ORAL | Status: DC

## 2016-11-14 MED ORDER — GABAPENTIN 100 MG PO CAPS
200.0000 mg | ORAL_CAPSULE | Freq: Every day | ORAL | Status: DC
  Administered 2016-11-14: 200 mg via ORAL
  Filled 2016-11-14: qty 2

## 2016-11-14 MED ORDER — ENOXAPARIN SODIUM 40 MG/0.4ML ~~LOC~~ SOLN
40.0000 mg | SUBCUTANEOUS | Status: DC
  Filled 2016-11-14: qty 0.4

## 2016-11-14 MED ORDER — POTASSIUM CHLORIDE CRYS ER 20 MEQ PO TBCR
20.0000 meq | EXTENDED_RELEASE_TABLET | ORAL | Status: DC
  Administered 2016-11-15: 20 meq via ORAL
  Filled 2016-11-14: qty 1

## 2016-11-14 MED ORDER — ADULT MULTIVITAMIN W/MINERALS CH
1.0000 | ORAL_TABLET | Freq: Every day | ORAL | Status: DC
  Administered 2016-11-15: 1 via ORAL
  Filled 2016-11-14: qty 1

## 2016-11-14 MED ORDER — DOCUSATE SODIUM 100 MG PO CAPS
100.0000 mg | ORAL_CAPSULE | Freq: Every day | ORAL | Status: DC
  Administered 2016-11-14: 100 mg via ORAL
  Filled 2016-11-14 (×2): qty 1

## 2016-11-14 NOTE — ED Provider Notes (Signed)
[facility]-EMERGENCY DEPT Provider Note   CSN: [id number] Arrival date & time: 11/14/16  1431     History   Chief Complaint Chief Complaint  Patient presents with  . Loss of Consciousness    HPI [name] [name] is a 66 y.o. female.  HPI 66 year old Caucasian female past medical history significant for congestive heart failure (last EF 55% in 2017), fibromyalgia, hearing loss, hypertension that presents to the emergency Department today with complaints of syncopal episode while at doctor's office. The patient was at the audiologst office today getting her hearing checked when she was sitting down and became dizzy and lightheaded. Stated that she thought she was going to pass out. Has been came to the room and held her as she did have a syncopal episode. Husband states the patient was out for approximately 2 minutes then came to was mildly disoriented. Denies any convulsions consistent with seizure-like activity. Denies hitting her head. Patient denies any history of diabetes, low blood sugar, chest pain, shortness of breath, nausea, vomiting. When EMS arrived they did note orthostatics with patient's blood pressure being 116/78 while lying down and 93/54 while sitting. They gave her 400 mL of IV fluids. Patient with epistaxis were normal in the ED. Patient is asymptomatic at this time. Past Medical History:  Diagnosis Date  . Congestive heart failure (HCC)   . Fibromyalgia   . Fibromyalgia   . Hearing loss of both ears   . Hypertension   . Lung nodules   . Migraines    controlled with meds; down to once or twice yearly  . Osteoporosis     Patient Active Problem List   Diagnosis Date Noted  . Syncope 11/14/2016  . Dyspnea 12/10/2012  . Fibromyalgia   . Congestive heart failure (HCC)   . Hypertension   . Osteoporosis   . Essential hypertension, benign 03/09/2010  . CARDIOMYOPATHY, PRIMARY, DILATED 07/07/2009  . FATIGUE / MALAISE 07/07/2009    Past Surgical History:  Procedure  Laterality Date  . BACK SURGERY  1986   ruptured disc  . BREAST SURGERY Right 1973   benign tumor  . fibromas removed    . HYSTEROSCOPY  11/98   with polypectomy  . TONSILLECTOMY     1960  . TUBAL LIGATION      OB History    Gravida Para Term Preterm AB Living   3 3 3  0 0 3   SAB TAB Ectopic Multiple Live Births   0 0 0 0 3       Home Medications    Prior to Admission medications   Medication Sig Start Date End Date Taking? Authorizing Provider  acetaminophen (TYLENOL) 500 MG tablet Take 500 mg by mouth every 6 (six) hours as needed for pain. Take 2 tablets as needed.    [provider]  amLODipine (NORVASC) 5 MG tablet Take 2.5 mg by mouth daily.    [provider]  aspirin 81 MG chewable tablet Chew 81 mg by mouth daily.      [provider]  B Complex Vitamins (B COMPLEX 1) tablet Take 1 tablet by mouth daily.      [provider]  buPROPion (WELLBUTRIN SR) 150 MG 12 hr tablet Take 150 mg by mouth daily.     [provider]  calcium carbonate (OS-CAL) 600 MG TABS Take 600 mg by mouth daily.     [provider]  carvedilol (COREG) 25 MG tablet Take 25 mg by mouth 2 (two)  times daily with a meal.      [provider]  Cinnamon 500 MG TABS Take 500 mg by mouth daily.    [provider]  Coenzyme Q10 (CO Q 10) 100 MG CAPS Take 100 mg by mouth 2 (two) times daily.     [provider]  cyclobenzaprine (FLEXERIL) 10 MG tablet Take 10 mg by mouth daily.     [provider]  docusate sodium (COLACE) 100 MG capsule Take 100 mg by mouth daily.    [provider]  fish oil-omega-3 fatty acids 1000 MG capsule Take 1 g by mouth 2 (two) times daily at 10 AM and 5 PM.      [provider]  furosemide (LASIX) 20 MG tablet Take 10 mg by mouth daily.      [provider]  gabapentin (NEURONTIN) 100 MG capsule Take 200 mg by mouth at bedtime.  03/10/15   [provider]    KLOR-CON M20 20 MEQ tablet Take 20 mEq by mouth every other day.  02/18/11   [provider]  Manganese Gluconate 50 MG TABS Take 50 mg by mouth daily.     [provider]  Multiple Vitamin (MULTIVITAMIN) tablet Take 1 tablet by mouth daily.      [provider]  pravastatin (PRAVACHOL) 40 MG tablet Take 40 mg by mouth 4 (four) times a week.     [provider]  SUMAtriptan (IMITREX) 50 MG tablet TK 1/2 TO 1 T PO PRF MIGRAINES 03/27/16   [provider]  TURMERIC PO Take 1 tablet by mouth daily.    [provider]  Vitamin E (E 400 BLENDED PO) Take 400 mg by mouth daily.     [provider]  zolpidem (AMBIEN) 10 MG tablet Take 10 mg by mouth at bedtime as needed for sleep.  03/15/11   [provider]    Family History Family History  Problem Relation Age of Onset  . Hypertension Mother   . Hypertension Father   . Colon cancer Neg Hx     Social History Social History  Substance Use Topics  . Smoking status: Former Smoker    Packs/day: 0.30    Types: Cigarettes    Quit date: 12/20/2014  . Smokeless tobacco: Never Used     Comment: 2 packs per week  . Alcohol use No     Allergies   Sulfa antibiotics and Escitalopram oxalate   Review of Systems Review of Systems  Constitutional: Negative for chills and fever.  HENT: Negative for congestion.   Eyes: Negative for visual disturbance.  Respiratory: Negative for cough and shortness of breath.   Cardiovascular: Negative for chest pain.  Gastrointestinal: Negative for abdominal pain, diarrhea, nausea and vomiting.  Genitourinary: Negative for dysuria, flank pain, hematuria and urgency.  Musculoskeletal: Negative for back pain.  Skin: Negative for color change.  Neurological: Positive for dizziness, syncope and light-headedness. Negative for seizures, weakness, numbness and headaches.     Physical Exam Updated Vital Signs BP (!) 141/59   Pulse 66   Temp  97.8 F (36.6 C) (Oral)   Resp 13   Ht 5' (1.524 m)   Wt 55.3 kg (122 lb)   LMP 01/22/2001 (Approximate)   SpO2 100%   BMI 23.83 kg/m   Physical Exam  Constitutional: She is oriented to person, place, and time. She appears well-developed and well-nourished. No distress.  HENT:  Head: Normocephalic and atraumatic.  Mouth/Throat: Oropharynx is  clear and moist.  Eyes: Conjunctivae and EOM are normal. Pupils are equal, round, and reactive to light. Right eye exhibits no discharge. Left eye exhibits no discharge. No scleral icterus.  Neck: Normal range of motion. Neck supple. No thyromegaly present.  Cardiovascular: Normal rate, regular rhythm, normal heart sounds and intact distal pulses.  Exam reveals no gallop and no friction rub.   No murmur heard. Pulses equal in all joints bilaterally.  Pulmonary/Chest: Effort normal and breath sounds normal. No respiratory distress. She has no wheezes. She has no rales. She exhibits no tenderness.  Abdominal: Soft. Bowel sounds are normal. She exhibits no distension. There is no tenderness. There is no rebound and no guarding.  Musculoskeletal: Normal range of motion. She exhibits no edema or deformity.  Lymphadenopathy:    She has no cervical adenopathy.  Neurological: She is alert and oriented to person, place, and time.  The patient is alert, attentive, and oriented x 3. Speech is clear. Cranial nerve II-VII grossly intact. Negative pronator drift. Sensation intact. Strength 5/5 in all extremities. Reflexes 2+ and symmetric at biceps, triceps, knees, and ankles. Rapid alternating movement and fine finger movements intact. Romberg is absent. Posture and gait normal.   Skin: Skin is warm and dry. Capillary refill takes less than 2 seconds.  Nursing note and vitals reviewed.    ED Treatments / Results  Labs (all labs ordered are listed, but only abnormal results are displayed) Labs Reviewed  BASIC METABOLIC PANEL - Abnormal; Notable for the  following:       Result Value   Glucose, Bld 112 (*)    GFR calc non Af Amer 57 (*)    All other components within normal limits  CBC WITH DIFFERENTIAL/PLATELET - Abnormal; Notable for the following:    RBC 3.85 (*)    All other components within normal limits  URINALYSIS, ROUTINE W REFLEX MICROSCOPIC  I-STAT TROPOININ, ED    EKG  EKG Interpretation  Date/Time:  Thursday Nov 14 2016 14:35:52 EDT Ventricular Rate:  66 PR Interval:    QRS Duration: 107 QT Interval:  415 QTC Calculation: 435 R Axis:   72 Text Interpretation:  Sinus rhythm Nonspecific T abnormalities, anterior leads No significant change since last tracing Confirmed by Doug Sou [phone]) on 11/14/2016 4:37:51 PM       Radiology Dg Chest 2 View  Result Date: 11/14/2016 CLINICAL DATA:  Syncopal episode EXAM: CHEST  2 VIEW COMPARISON:  Chest CT 06/04/2013 FINDINGS: Mild cardiomegaly. Aortic tortuosity and right peritracheal fullness that is stable from 2014 chest CT scanogram. EKG leads create artifact over the chest. There is no edema, consolidation, effusion, or pneumothorax. IMPRESSION: 1. No acute finding. 2. Mild cardiomegaly. Electronically Signed   By: Marnee Spring M.D.   On: 11/14/2016 16:19    Procedures Procedures (including critical care time)  Medications Ordered in ED Medications  sodium chloride 0.9 % bolus 500 mL (0 mLs Intravenous Stopped 11/14/16 1631)     Initial Impression / Assessment and Plan / ED Course  I have reviewed the triage vital signs and the nursing notes.  Pertinent labs & imaging results that were available during my care of the patient were reviewed by me and considered in my medical decision making (see chart for details).     The patient presents to the emergency department today after syncopal episode while the doctor's office. Patient was noted to be orthostatic by EMS. Patient is asymptomatic on arrival to the ED. Orthostatics have been  normal in the ED. EKG notes  sinus rhythm with nonspecific T-wave changes. Troponin is negative. All other labs unremarkable. No leukocytosis. Electrolytes are normal. UA shows no signs of infection. Chest x-ray shows mild cardiomegaly. Vital signs within normal limits ED. Unknown cause of patient's syncopal episode today. Felt the patient needs to be admitted to the hospital for observation or further syncopal workup. Patient agreeable to the above plan. Patient was seen and evaluated with Dr. Ethelda Chick who is agreeable to above plan. Spoke with Dr. Randol Kern hospital medicine who agrees to admit patient we'll place admission orders. Hemodynamically stable at this time.  Final Clinical Impressions(s) / ED Diagnoses   Final diagnoses:  Syncope and collapse    New Prescriptions New Prescriptions   No medications on file     Wallace Keller 11/14/16 1707    Doug Sou, MD 11/14/16 [phone]

## 2016-11-14 NOTE — ED Notes (Signed)
Patient transported to X-ray 

## 2016-11-14 NOTE — ED Notes (Signed)
MD requested Secretary to get patient food. Secretary gave patient food at this time.

## 2016-11-14 NOTE — ED Notes (Signed)
Pt returned from X-ray.  

## 2016-11-14 NOTE — ED Provider Notes (Signed)
Patient had syncopal event while in the doctor's office immediately prior to coming here. She was brought by EMS. Treated by EMS with saline 400 mL IV prior to coming here. Presently asymptomatic on exam no distress lungs clear auscultation heart regular rate and rhythm abdomen nondistended nontender   Doug Sou, MD 11/14/16 1643

## 2016-11-14 NOTE — ED Notes (Signed)
Admitting at the bedside.  

## 2016-11-14 NOTE — ED Triage Notes (Signed)
Pt. From [facility]cp via [facility] where she had a syncopal episode that lasted for 2  Minutes with no pulse. When ems arrived patient lis alert and oriented x 4. EMS did note a blood pressure change from when patient was lying down to when she sat up. 116/78 (lying) 93/54 (sitting) Patient a&o x4 on arrival.

## 2016-11-14 NOTE — H&P (Signed)
[facility] H&P   Patient Demographics:    [name] [name], is a 66 y.o. female  MRN: [id number]   DOB - Mar 09, 1951  Admit Date - 11/14/2016  Outpatient Primary MD for the patient is Rodrigo Ran, MD  Referring MD/NP/PA: PA Leapheart  Outpatient Specialists: Cardiology Dr [email or web address]  Patient coming from: Home  Chief Complaint  Patient presents with  . Loss of Consciousness      HPI:    [name] [name]  is a 66 y.o. female,  past medical history significant for congestive heart failure (last EF 55% in 2017, was 15%in 2006), fibromyalgia, hearing loss, hypertension , presents with syncope, reports she was at the physician office, having hearing test done, reports she started to feel dizziness, lightheadedness, when she was sitting during the test, followed by syncopal episode lasted for 2 minutes, there was no head trauma, husband helped her to the floor, she was orthostatic by EMS at the scene, supine blood pressure 116/78, sitting 93/54, patient reports she had problem with soft blood pressure for few month, her ramipril has been stopped because of that, as well as amlodipine has been stopped, but she did suffer from rebound migraines, so she continued with low-dose amlodipine 2.5 mg with no recurrence of migraine, she received 400 mL fluid bolus by EMS, currently she is not orthostatic, there was no seizure activity noted, no urinary or stool incontinence, she denies chest pain, shortness of breath, nausea or vomiting, workup in ED with no significant labs abnormalities, negative urinalysis, chest x-ray with no acute findings, I was called to admit.    Review of systems:    In addition to the HPI above,  No Fever-chills, No Headache, No changes with Vision or hearing, No problems swallowing food or Liquids, No Chest pain, Cough or Shortness of Breath,Reports lightheadedness, followed by syncope  No  Abdominal pain, No Nausea or Vommitting, Bowel movements are regular, No Blood in stool or Urine, No dysuria, No new skin rashes or bruises, No new joints pains-aches,  No new weakness, tingling, numbness in any extremity, No recent weight gain or loss, No polyuria, polydypsia or polyphagia, No significant Mental Stressors.  A full 10 point Review of Systems was done, except as stated above, all other Review of Systems were negative.   With Past History of the following :    Past Medical History:  Diagnosis Date  . Congestive heart failure (HCC)   . Fibromyalgia   . Fibromyalgia   . Hearing loss of both ears   . Hypertension   . Lung nodules   . Migraines    controlled with meds; down to once or twice yearly  . Osteoporosis       Past Surgical History:  Procedure Laterality Date  . BACK SURGERY  1986   ruptured disc  . BREAST SURGERY Right 1973   benign tumor  . fibromas removed    .  HYSTEROSCOPY  11/98   with polypectomy  . TONSILLECTOMY     1960  . TUBAL LIGATION        Social History:     Social History  Substance Use Topics  . Smoking status: Former Smoker    Packs/day: 0.30    Types: Cigarettes    Quit date: 12/20/2014  . Smokeless tobacco: Never Used     Comment: 2 packs per week  . Alcohol use No     Lives - At home  Mobility - independent     Family History :     Family History  Problem Relation Age of Onset  . Hypertension Mother   . Hypertension Father   . Colon cancer Neg Hx       Home Medications:   Prior to Admission medications   Medication Sig Start Date End Date Taking? Authorizing Provider  acetaminophen (TYLENOL) 500 MG tablet Take 500 mg by mouth every 6 (six) hours as needed for pain. Take 2 tablets as needed.    [provider]  amLODipine (NORVASC) 5 MG tablet Take 2.5 mg by mouth daily.    [provider]  aspirin 81 MG chewable tablet Chew 81 mg by mouth daily.      [provider]  B  Complex Vitamins (B COMPLEX 1) tablet Take 1 tablet by mouth daily.      [provider]  buPROPion (WELLBUTRIN SR) 150 MG 12 hr tablet Take 150 mg by mouth daily.     [provider]  calcium carbonate (OS-CAL) 600 MG TABS Take 600 mg by mouth daily.     [provider]  carvedilol (COREG) 25 MG tablet Take 25 mg by mouth 2 (two) times daily with a meal.      [provider]  Cinnamon 500 MG TABS Take 500 mg by mouth daily.    [provider]  Coenzyme Q10 (CO Q 10) 100 MG CAPS Take 100 mg by mouth 2 (two) times daily.     [provider]  cyclobenzaprine (FLEXERIL) 10 MG tablet Take 10 mg by mouth daily.     [provider]  docusate sodium (COLACE) 100 MG capsule Take 100 mg by mouth daily.    [provider]  fish oil-omega-3 fatty acids 1000 MG capsule Take 1 g by mouth 2 (two) times daily at 10 AM and 5 PM.      [provider]  furosemide (LASIX) 20 MG tablet Take 10 mg by mouth daily.      [provider]  gabapentin (NEURONTIN) 100 MG capsule Take 200 mg by mouth at bedtime.  03/10/15   [provider]  KLOR-CON M20 20 MEQ tablet Take 20 mEq by mouth every other day.  02/18/11   [provider]  Manganese Gluconate 50 MG TABS Take 50 mg by mouth daily.     [provider]  Multiple Vitamin (MULTIVITAMIN) tablet Take 1 tablet by mouth daily.      [provider]  pravastatin (PRAVACHOL) 40 MG tablet Take 40 mg by mouth 4 (four) times a week.     [provider]  SUMAtriptan (IMITREX) 50 MG tablet TK 1/2 TO 1 T PO PRF MIGRAINES 03/27/16   [provider]  TURMERIC PO Take 1 tablet by mouth daily.    [provider]  Vitamin E (E 400 BLENDED PO) Take 400 mg by mouth daily.     [provider]  zolpidem (AMBIEN) 10 MG tablet Take 10 mg by mouth at bedtime as needed for sleep.  03/15/11   [provider]     Allergies:       Allergies  Allergen Reactions  . Citrus Other (See Comments)    MIGRAINES (no citric acid, either)  . Sulfa Antibiotics Nausea And Vomiting  . Escitalopram Oxalate Other (See Comments)    Flu-like symptoms without fever     Physical Exam:   Vitals  Blood pressure (!) 141/59, pulse 66, temperature 97.8 F (36.6 C), temperature source Oral, resp. rate 13, height 5' (1.524 m), weight 55.3 kg (122 lb), last menstrual period 01/22/2001, SpO2 100 %.   1. General Well-developed elderly female lying in bed in NAD,    2. Normal affect and insight, Not Suicidal or Homicidal, Awake Alert, Oriented X 3.  3. No F.N deficits, ALL C.Nerves Intact, hard of hearing at baseline, wearing hearing aids, Strength 5/5 all 4 extremities, Sensation intact all 4 extremities, Plantars down going.  4. Ears and Eyes appear Normal, Conjunctivae clear, PERRLA. Moist Oral Mucosa.  5. Supple Neck, No JVD, No cervical lymphadenopathy appriciated, No Carotid Bruits.  6. Symmetrical Chest wall movement, Good air movement bilaterally, CTAB.  7. RRR, No Gallops, Rubs or Murmurs, No Parasternal Heave.  8. Positive Bowel Sounds, Abdomen Soft, No tenderness, No organomegaly appriciated,No rebound -guarding or rigidity.  9.  No Cyanosis, Normal Skin Turgor, No Skin Rash or Bruise.  10. Good muscle tone,  joints appear normal , no effusions, Normal ROM.  11. No Palpable Lymph Nodes in Neck or Axillae     Data Review:    CBC  Recent Labs Lab 11/14/16 1544  WBC 8.0  HGB 12.0  HCT 36.2  PLT 203  MCV 94.0  MCH 31.2  MCHC 33.1  RDW 12.7  LYMPHSABS 1.2  MONOABS 0.5  EOSABS 0.1  BASOSABS 0.0   ------------------------------------------------------------------------------------------------------------------  Chemistries   Recent Labs Lab 11/14/16 1544  NA 140  K 3.9  CL 105  CO2 27  GLUCOSE 112*  BUN 15  CREATININE 1.00  CALCIUM 9.2    ------------------------------------------------------------------------------------------------------------------ estimated creatinine clearance is 42.6 mL/min (by C-G formula based on SCr of 1 mg/dL). ------------------------------------------------------------------------------------------------------------------ No results for input(s): TSH, T4TOTAL, T3FREE, THYROIDAB in the last 72 hours.  Invalid input(s): FREET3  Coagulation profile No results for input(s): INR, PROTIME in the last 168 hours. ------------------------------------------------------------------------------------------------------------------- No results for input(s): DDIMER in the last 72 hours. -------------------------------------------------------------------------------------------------------------------  Cardiac Enzymes No results for input(s): CKMB, TROPONINI, MYOGLOBIN in the last 168 hours.  Invalid input(s): CK ------------------------------------------------------------------------------------------------------------------ No results found for: BNP   ---------------------------------------------------------------------------------------------------------------  Urinalysis    Component Value Date/Time   COLORURINE YELLOW 11/14/2016 1555   APPEARANCEUR CLEAR 11/14/2016 1555   LABSPEC 1.006 11/14/2016 1555   PHURINE 6.0 11/14/2016 1555   GLUCOSEU NEGATIVE 11/14/2016 1555   HGBUR NEGATIVE 11/14/2016 1555   BILIRUBINUR NEGATIVE 11/14/2016 1555   KETONESUR NEGATIVE 11/14/2016 1555   PROTEINUR NEGATIVE 11/14/2016 1555   NITRITE NEGATIVE 11/14/2016 1555   LEUKOCYTESUR NEGATIVE 11/14/2016 1555    ----------------------------------------------------------------------------------------------------------------   Imaging Results:    Dg Chest 2 View  Result Date: 11/14/2016 CLINICAL DATA:  Syncopal episode EXAM: CHEST  2 VIEW COMPARISON:  Chest CT 06/04/2013 FINDINGS: Mild cardiomegaly. Aortic  tortuosity and right peritracheal fullness that is stable from 2014 chest CT scanogram. EKG leads create artifact over the chest. There is no edema, consolidation, effusion, or pneumothorax. IMPRESSION: 1. No acute finding.  2. Mild cardiomegaly. Electronically Signed   By: Marnee Spring M.D.   On: 11/14/2016 16:19    My personal review of EKG: Rhythm NSR, Rate  66/min, QTc 435 , nonspecific T-wave abnormalities   Assessment & Plan:    Active Problems:   Essential hypertension, benign   Cardiomyopathy (HCC)   Fibromyalgia   Syncope   Chronic diastolic CHF (congestive heart failure) (HCC)   Syncope - This is most likely related to orthostasis(blood pressure 116/78 supine, 93/54 sitting by EMS), and this is most likely due to antihypertensive medication, this problem has been addressed by her PCP, where they have been gradually weaned off(currently off ramipril, and amlodipine has been kept at low dose for persistent migraines), so discussed with the patient, for now I will hold her Lasix overnight, will decrease her Coreg dose by half, continue with amlodipine at current dose to prevent recurrent migraines, which keep on gentle hydration overnight(total of 500 mL), will monitor on telemetry, will obtain 2-D echo, and will obtain CT head.  Cardiomyopathy/chronic diastolic CHF - EF 15% in 2006, totally recovered, EF 55% and recent echo August 2017, with a grade 1 diastolic dysfunction, followed by Dr. Sampson Goon. - Currently no evidence of volume overload, will hold Lasix today, will give gentle hydration and monitor closely for volume overload, will decrease Coreg dose by half.  Hypertension - Actually blood pressure has been controlled in outpatient setting, her antihypertensive medication has been discontinued gradually getting soft blood pressure. - Please see above discussion regarding decreasing carvedilol dose  half, and continue with low-dose amlodipine mainly for  migraines.  Fibromyalgia - continue with gabapentin   DVT Prophylaxis  Lovenox - SCDs   AM Labs Ordered, also please review Full Orders  Family Communication: Admission, patients condition and plan of care including tests being ordered have been discussed with the patient and Husband who indicate understanding and agree with the plan and Code Status.  Code Status Full  Likely DC to  Home  Condition GUARDED    Consults called: none  Admission status: Observation  Time spent in minutes : 55 minutes   Kennede Lusk M.D on 11/14/2016 at 5:52 PM  Between 7am to 7pm - Pager - [phone]. After 7pm go to [email or web address] - password [email or web address]  [facility] - Office  [phone]

## 2016-11-15 ENCOUNTER — Observation Stay (HOSPITAL_BASED_OUTPATIENT_CLINIC_OR_DEPARTMENT_OTHER): Payer: Medicare Other

## 2016-11-15 ENCOUNTER — Observation Stay (HOSPITAL_COMMUNITY): Payer: Medicare Other

## 2016-11-15 DIAGNOSIS — R55 Syncope and collapse: Secondary | ICD-10-CM | POA: Diagnosis not present

## 2016-11-15 DIAGNOSIS — I10 Essential (primary) hypertension: Secondary | ICD-10-CM | POA: Diagnosis not present

## 2016-11-15 DIAGNOSIS — R42 Dizziness and giddiness: Secondary | ICD-10-CM | POA: Diagnosis not present

## 2016-11-15 DIAGNOSIS — I5032 Chronic diastolic (congestive) heart failure: Secondary | ICD-10-CM | POA: Diagnosis not present

## 2016-11-15 LAB — BASIC METABOLIC PANEL
Anion gap: 8 (ref 5–15)
BUN: 14 mg/dL (ref 6–20)
CALCIUM: 8.9 mg/dL (ref 8.9–10.3)
CO2: 26 mmol/L (ref 22–32)
CREATININE: 0.92 mg/dL (ref 0.44–1.00)
Chloride: 107 mmol/L (ref 101–111)
Glucose, Bld: 129 mg/dL — ABNORMAL HIGH (ref 65–99)
Potassium: 3.3 mmol/L — ABNORMAL LOW (ref 3.5–5.1)
Sodium: 141 mmol/L (ref 135–145)

## 2016-11-15 LAB — CBC
HCT: 33.2 % — ABNORMAL LOW (ref 36.0–46.0)
Hemoglobin: 11.1 g/dL — ABNORMAL LOW (ref 12.0–15.0)
MCH: 31.4 pg (ref 26.0–34.0)
MCHC: 33.4 g/dL (ref 30.0–36.0)
MCV: 94.1 fL (ref 78.0–100.0)
PLATELETS: 225 10*3/uL (ref 150–400)
RBC: 3.53 MIL/uL — AB (ref 3.87–5.11)
RDW: 12.7 % (ref 11.5–15.5)
WBC: 6.8 10*3/uL (ref 4.0–10.5)

## 2016-11-15 LAB — GLUCOSE, CAPILLARY: GLUCOSE-CAPILLARY: 118 mg/dL — AB (ref 65–99)

## 2016-11-15 LAB — ECHOCARDIOGRAM COMPLETE
HEIGHTINCHES: 60 in
WEIGHTICAEL: 2059.2 [oz_av]

## 2016-11-15 MED ORDER — FUROSEMIDE 20 MG PO TABS: 10.0000 mg | ORAL_TABLET | Freq: Every day | ORAL | Status: DC

## 2016-11-15 MED ORDER — POTASSIUM CHLORIDE CRYS ER 20 MEQ PO TBCR
20.0000 meq | EXTENDED_RELEASE_TABLET | Freq: Once | ORAL | Status: AC
  Administered 2016-11-15: 20 meq via ORAL
  Filled 2016-11-15: qty 1

## 2016-11-15 MED ORDER — CARVEDILOL 12.5 MG PO TABS: 12.5000 mg | ORAL_TABLET | Freq: Two times a day (BID) | ORAL | 0 refills | Status: AC

## 2016-11-15 NOTE — Progress Notes (Signed)
CT called, will be getting patient soon. IV fluids on hold, will remove tele monitoring upon transportation arrival.

## 2016-11-15 NOTE — Discharge Summary (Signed)
[name] [name], is a 66 y.o. female  DOB 11-Mar-1951  MRN [id number].  Admission date:  11/14/2016  Admitting Physician  Starleen Arms, MD  Discharge Date:  11/15/2016   Primary MD  Rodrigo Ran, MD  Recommendations for primary care physician for things to follow:  - Please check CBC, BMP during next visit - Better blood pressure closely and adjust medication as needed, Coreg dose was decreased by half giving orthostasis, she was continued on amlodipine giving her current migraine upon stopping it.   Admission Diagnosis  Syncope and collapse [R55] Syncope [R55]   Discharge Diagnosis  Syncope and collapse [R55] Syncope [R55]    Active Problems:   Essential hypertension, benign   Cardiomyopathy (HCC)   Fibromyalgia   Syncope   Chronic diastolic CHF (congestive heart failure) (HCC)      Past Medical History:  Diagnosis Date  . Congestive heart failure (HCC)   . Fibromyalgia   . Fibromyalgia   . Hearing loss of both ears   . Hypertension   . Lung nodules   . Migraines    controlled with meds; down to once or twice yearly  . Osteoporosis     Past Surgical History:  Procedure Laterality Date  . BACK SURGERY  1986   ruptured disc  . BREAST SURGERY Right 1973   benign tumor  . fibromas removed    . HYSTEROSCOPY  11/98   with polypectomy  . TONSILLECTOMY     1960  . TUBAL LIGATION         History of present illness and  Hospital Course:     Kindly see H&P for history of present illness and admission details, please review complete Labs, Consult reports and Test reports for all details in brief  HPI  from the history and physical done on the day of admission 11/14/2016   [name] [name]  is a 66 y.o. female, past medical history significant for congestive heart failure (last EF 55% in 2017, was 15%in 2006), fibromyalgia, hearing loss, hypertension , presents with syncope, reports she  was at the physician office, having hearing test done, reports she started to feel dizziness, lightheadedness, when she was sitting during the test, followed by syncopal episode lasted for 2 minutes, there was no head trauma, husband helped her to the floor, she was orthostatic by EMS at the scene, supine blood pressure 116/78, sitting 93/54, patient reports she had problem with soft blood pressure for few month, her ramipril has been stopped because of that, as well as amlodipine has been stopped, but she did suffer from rebound migraines, so she continued with low-dose amlodipine 2.5 mg with no recurrence of migraine, she received 400 mL fluid bolus by EMS, currently she is not orthostatic, there was no seizure activity noted, no urinary or stool incontinence, she denies chest pain, shortness of breath, nausea or vomiting, workup in ED with no significant labs abnormalities, negative urinalysis, chest x-ray with no acute findings, I was called to admit.  Hospital Course  Syncope - This is secondary to orthostasis from her medications, she was admitted for observation overnight, she was orthostatic initially, which resolved  with gentle hydration, and holding Lasix, and decreasing her carvedilol dose by half, CT head with no acute finding, 2-D echo with a preserved EF, telemetry significant arrhythmias, bradycardia or pauses, her medication has been tapered gradually by her outpatient physician, ramipril has been stopped, and increased her amlodipine dose (she was continued on it secondary to migraine when she stopped it), so I did decrease her carvedilol dose by half, went from 25-12.5 twice a day.  Chronic diastolic CHF - Appears to be euvolemic, was kept on gentle hydration overnight, no evidence of volume overload, she took to hold Lasix for 3 days, and to resume after that - EF 15% in 2006, totally recovered, EF 55% and recent echo August 2017, with a grade 1 diastolic dysfunction, followed by Dr.  Sampson Goon. Repeat 2-D echo during hospital stay showing preserved EF at 55%  Hypertension - She she is with soft blood pressure, continue with Coreg at 12.5 mg twice a day, and amlodipine 2.5 mg  Fibromyalgia - continue with gabapentin  Discharge Condition:  stable   Follow UP  Follow-up Information    Rodrigo Ran, MD Follow up in 1 week(s).   Specialty:  Internal Medicine Contact information: [address] [place] [address] [phone]             Discharge Instructions  and  Discharge Medications   Discharge Instructions    Discharge instructions    Complete by:  As directed    Follow with Primary MD Rodrigo Ran, MD in 7 days   Get CBC, CMP,  checked  by Primary MD next visit.    Activity: As tolerated with Full fall precautions use walker/cane & assistance as needed   Disposition Home    Diet: Heart Healthy   For Heart failure patients - Check your Weight same time everyday, if you gain over 2 pounds, or you develop in leg swelling, experience more shortness of breath or chest pain, call your Primary MD immediately. Follow Cardiac Low Salt Diet and 1.5 lit/day fluid restriction.   On your next visit with your primary care physician please Get Medicines reviewed and adjusted.   Please request your Prim.MD to go over all Hospital Tests and Procedure/Radiological results at the follow up, please get all Hospital records sent to your Prim MD by signing hospital release before you go home.   If you experience worsening of your admission symptoms, develop shortness of breath, life threatening emergency, suicidal or homicidal thoughts you must seek medical attention immediately by calling 911 or calling your MD immediately  if symptoms less severe.  You Must read complete instructions/literature along with all the possible adverse reactions/side effects for all the Medicines you take and that have been prescribed to you. Take any new Medicines after you  have completely understood and accpet all the possible adverse reactions/side effects.   Do not drive, operating heavy machinery, perform activities at heights, swimming or participation in water activities or provide baby sitting services if your were admitted for syncope or siezures until you have seen by Primary MD or a Neurologist and advised to do so again.  Do not drive when taking Pain medications.    Do not take more than prescribed Pain, Sleep and Anxiety Medications  Special Instructions: If you have smoked or chewed Tobacco  in the last 2 yrs please stop smoking,  stop any regular Alcohol  and or any Recreational drug use.  Wear Seat belts while driving.   Please note  You were cared for by a hospitalist during your hospital stay. If you have any questions about your discharge medications or the care you received while you were in the hospital after you are discharged, you can call the unit and asked to speak with the hospitalist on call if the hospitalist that took care of you is not available. Once you are discharged, your primary care physician will handle any further medical issues. Please note that NO REFILLS for any discharge medications will be authorized once you are discharged, as it is imperative that you return to your primary care physician (or establish a relationship with a primary care physician if you do not have one) for your aftercare needs so that they can reassess your need for medications and monitor your lab values.   Increase activity slowly    Complete by:  As directed      Allergies as of 11/15/2016      Reactions   Citrus Other (See Comments)   MIGRAINES (no citric acid, either)   Sulfa Antibiotics Nausea And Vomiting   Escitalopram Oxalate Other (See Comments)   Flu-like symptoms without fever      Medication List    TAKE these medications   acetaminophen 500 MG tablet Commonly known as:  TYLENOL Take 1,000 mg by mouth at bedtime.   amLODipine  5 MG tablet Commonly known as:  NORVASC Take 2.5 mg by mouth daily.   aspirin 81 MG chewable tablet Chew 81 mg by mouth at bedtime.   buPROPion 150 MG 12 hr tablet Commonly known as:  WELLBUTRIN SR Take 150 mg by mouth daily.   calcium carbonate 600 MG Tabs tablet Commonly known as:  OS-CAL Take 600 mg by mouth daily.   carvedilol 12.5 MG tablet Commonly known as:  COREG Take 1 tablet (12.5 mg total) by mouth 2 (two) times daily with a meal. What changed:  medication strength  how much to take   Cinnamon 500 MG Tabs Take 500 mg by mouth daily.   Co Q 10 100 MG Caps Take 100 mg by mouth 2 (two) times daily.   cyclobenzaprine 10 MG tablet Commonly known as:  FLEXERIL Take 10 mg by mouth at bedtime.   docusate sodium 100 MG capsule Commonly known as:  COLACE Take 100 mg by mouth daily.   E 400 BLENDED PO Take 400 mg by mouth 4 (four) times a week.   fish oil-omega-3 fatty acids 1000 MG capsule Take 1 g by mouth 2 (two) times daily at 10 AM and 5 PM.   furosemide 20 MG tablet Commonly known as:  LASIX Take 0.5 tablets (10 mg total) by mouth daily. Keep holding for 3 days, then resume on 5/28 Start taking on:  11/18/2016 What changed:  additional instructions  These instructions start on 11/18/2016. If you are unsure what to do until then, ask your doctor or other care provider.   gabapentin 100 MG capsule Commonly known as:  NEURONTIN Take 200 mg by mouth at bedtime.   KLOR-CON M20 20 MEQ tablet Generic drug:  potassium chloride SA Take 20 mEq by mouth every other day.   Manganese Gluconate 50 MG Tabs Take 50 mg by mouth at bedtime.   multivitamin tablet Take 1 tablet by mouth daily.   pravastatin 40 MG tablet Commonly known as:  PRAVACHOL Take 40 mg by  mouth 4 (four) times a week.   prednisoLONE acetate 1 % ophthalmic suspension Commonly known as:  PRED FORTE Place 1 drop into the left eye daily.   SUMAtriptan 50 MG tablet Commonly known as:   IMITREX Take 25-50 mg by mouth once as needed for migraines   TURMERIC PO Take 1 capsule by mouth daily.   VITAMIN B-2 PO Take 1 tablet by mouth daily.   zolpidem 10 MG tablet Commonly known as:  AMBIEN Take 10 mg by mouth at bedtime as needed for sleep.         Diet and Activity recommendation: See Discharge Instructions above   Consults obtained -  none   Major procedures and Radiology Reports - PLEASE review detailed and final reports for all details, in brief -      Dg Chest 2 View  Result Date: 11/14/2016 CLINICAL DATA:  Syncopal episode EXAM: CHEST  2 VIEW COMPARISON:  Chest CT 06/04/2013 FINDINGS: Mild cardiomegaly. Aortic tortuosity and right peritracheal fullness that is stable from 2014 chest CT scanogram. EKG leads create artifact over the chest. There is no edema, consolidation, effusion, or pneumothorax. IMPRESSION: 1. No acute finding. 2. Mild cardiomegaly. Electronically Signed   By: Marnee Spring M.D.   On: 11/14/2016 16:19   Ct Head Wo Contrast  Result Date: 11/15/2016 CLINICAL DATA:  Syncopal episode today. Dizziness and lightheadedness. Patient did not hit her head. EXAM: CT HEAD WITHOUT CONTRAST TECHNIQUE: Contiguous axial images were obtained from the base of the skull through the vertex without intravenous contrast. COMPARISON:  None. FINDINGS: Brain: No evidence of acute infarction, hemorrhage, hydrocephalus, extra-axial collection or mass lesion/mass effect. Vascular: No hyperdense vessel or unexpected calcification. Skull: Normal. Negative for fracture or focal lesion. Sinuses/Orbits: No acute finding. Other: None. IMPRESSION: No acute intracranial abnormalities. Electronically Signed   By: Burman Nieves M.D.   On: 11/15/2016 01:58    Micro Results     No results found for this or any previous visit (from the past 240 hour(s)).     Today   Subjective:   [name] [name] today has no headache,no chest abdominal pain,no new weakness tingling or  numbness, feels much better wants to go home today.   Objective:   Blood pressure 137/69, pulse 80, temperature 98 F (36.7 C), temperature source Oral, resp. rate 18, height 5' (1.524 m), weight 58.4 kg (128 lb 11.2 oz), last menstrual period 01/22/2001, SpO2 98 %.   Intake/Output Summary (Last 24 hours) at 11/15/16 1441 Last data filed at 11/15/16 1006  Gross per 24 hour  Intake          1121.83 ml  Output              600 ml  Net           521.83 ml    Exam Awake Alert, Oriented x 3, No new F.N deficits, Normal affect Supple Neck,No JVD,.  Symmetrical Chest wall movement, Good air movement bilaterally, CTAB RRR,No Gallops,Rubs or new Murmurs, No Parasternal Heave +ve B.Sounds, Abd Soft, Non tender, , No rebound -guarding or rigidity. No Cyanosis, Clubbing or edema, No new Rash or bruise  Data Review   CBC w Diff: Lab Results  Component Value Date   WBC 6.8 11/15/2016   HGB 11.1 (L) 11/15/2016   HCT 33.2 (L) 11/15/2016   PLT 225 11/15/2016   LYMPHOPCT 15 11/14/2016   MONOPCT 6 11/14/2016   EOSPCT 1 11/14/2016   BASOPCT 0 11/14/2016  CMP: Lab Results  Component Value Date   NA 141 11/15/2016   K 3.3 (L) 11/15/2016   CL 107 11/15/2016   CO2 26 11/15/2016   BUN 14 11/15/2016   CREATININE 0.92 11/15/2016  .   Total Time in preparing paper work, data evaluation and todays exam - 35 minutes  [name] [name] M.D on 11/15/2016 at 2:41 PM  [facility]   Office  [phone]

## 2016-11-15 NOTE — Progress Notes (Signed)
  Echocardiogram 2D Echocardiogram has been performed.  [name] [name] 11/15/2016, 12:03 PM

## 2016-11-15 NOTE — Care Management Note (Signed)
Case Management Note  Patient Details  Name: [name] [name] MRN: [id number] Date of Birth: 10/15/1950  Subjective/Objective:                 Spoke to patient and husband at bedside. Pt in obs for syncope. Patient is from home with spouse, is independent for ADLs. Echo Pending. No CM needs identified. PCP [facility] on [address]   Action/Plan:   Expected Discharge Date:  11/15/16               Expected Discharge Plan:  Home/Self Care  In-House Referral:     Discharge planning Services  CM Consult  Post Acute Care Choice:    Choice offered to:     DME Arranged:    DME Agency:     HH Arranged:    HH Agency:     Status of Service:  In process, will continue to follow  If discussed at Long Length of Stay Meetings, dates discussed:    Additional Comments:  [name] Sabal, RN 11/15/2016, 10:45 AM

## 2016-11-15 NOTE — Progress Notes (Signed)
Patient given discharge instructions and all questions answered.  

## 2016-11-15 NOTE — Discharge Instructions (Signed)
Follow with Primary MD Rodrigo Ran, MD in 7 days   Get CBC, CMP,  checked  by Primary MD next visit.    Activity: As tolerated with Full fall precautions use walker/cane & assistance as needed   Disposition Home    Diet: Heart Healthy   For Heart failure patients - Check your Weight same time everyday, if you gain over 2 pounds, or you develop in leg swelling, experience more shortness of breath or chest pain, call your Primary MD immediately. Follow Cardiac Low Salt Diet and 1.5 lit/day fluid restriction.   On your next visit with your primary care physician please Get Medicines reviewed and adjusted.   Please request your Prim.MD to go over all Hospital Tests and Procedure/Radiological results at the follow up, please get all Hospital records sent to your Prim MD by signing hospital release before you go home.   If you experience worsening of your admission symptoms, develop shortness of breath, life threatening emergency, suicidal or homicidal thoughts you must seek medical attention immediately by calling 911 or calling your MD immediately  if symptoms less severe.  You Must read complete instructions/literature along with all the possible adverse reactions/side effects for all the Medicines you take and that have been prescribed to you. Take any new Medicines after you have completely understood and accpet all the possible adverse reactions/side effects.   Do not drive, operating heavy machinery, perform activities at heights, swimming or participation in water activities or provide baby sitting services if your were admitted for syncope or siezures until you have seen by Primary MD or a Neurologist and advised to do so again.  Do not drive when taking Pain medications.    Do not take more than prescribed Pain, Sleep and Anxiety Medications  Special Instructions: If you have smoked or chewed Tobacco  in the last 2 yrs please stop smoking, stop any regular Alcohol  and or any  Recreational drug use.  Wear Seat belts while driving.   Please note  You were cared for by a hospitalist during your hospital stay. If you have any questions about your discharge medications or the care you received while you were in the hospital after you are discharged, you can call the unit and asked to speak with the hospitalist on call if the hospitalist that took care of you is not available. Once you are discharged, your primary care physician will handle any further medical issues. Please note that NO REFILLS for any discharge medications will be authorized once you are discharged, as it is imperative that you return to your primary care physician (or establish a relationship with a primary care physician if you do not have one) for your aftercare needs so that they can reassess your need for medications and monitor your lab values.

## 2016-11-21 DIAGNOSIS — N183 Chronic kidney disease, stage 3 (moderate): Secondary | ICD-10-CM | POA: Diagnosis not present

## 2016-11-21 DIAGNOSIS — R55 Syncope and collapse: Secondary | ICD-10-CM | POA: Diagnosis not present

## 2016-12-14 DIAGNOSIS — I9589 Other hypotension: Secondary | ICD-10-CM | POA: Diagnosis not present

## 2016-12-14 DIAGNOSIS — E784 Other hyperlipidemia: Secondary | ICD-10-CM | POA: Diagnosis not present

## 2016-12-14 DIAGNOSIS — Z6823 Body mass index (BMI) 23.0-23.9, adult: Secondary | ICD-10-CM | POA: Diagnosis not present

## 2016-12-14 DIAGNOSIS — R55 Syncope and collapse: Secondary | ICD-10-CM | POA: Diagnosis not present

## 2016-12-14 DIAGNOSIS — R5383 Other fatigue: Secondary | ICD-10-CM | POA: Diagnosis not present

## 2016-12-14 DIAGNOSIS — N183 Chronic kidney disease, stage 3 (moderate): Secondary | ICD-10-CM | POA: Diagnosis not present

## 2016-12-14 DIAGNOSIS — I10 Essential (primary) hypertension: Secondary | ICD-10-CM | POA: Diagnosis not present

## 2016-12-14 DIAGNOSIS — I509 Heart failure, unspecified: Secondary | ICD-10-CM | POA: Diagnosis not present

## 2016-12-14 DIAGNOSIS — H9193 Unspecified hearing loss, bilateral: Secondary | ICD-10-CM | POA: Diagnosis not present

## 2016-12-14 DIAGNOSIS — M797 Fibromyalgia: Secondary | ICD-10-CM | POA: Diagnosis not present

## 2016-12-17 DIAGNOSIS — H903 Sensorineural hearing loss, bilateral: Secondary | ICD-10-CM | POA: Diagnosis not present

## 2016-12-17 DIAGNOSIS — H9313 Tinnitus, bilateral: Secondary | ICD-10-CM | POA: Diagnosis not present

## 2017-03-03 ENCOUNTER — Emergency Department (HOSPITAL_COMMUNITY): Payer: Medicare Other

## 2017-03-03 ENCOUNTER — Emergency Department (HOSPITAL_COMMUNITY)
Admission: EM | Admit: 2017-03-03 | Discharge: 2017-03-03 | Disposition: A | Discharge status: Home / Self Care | Payer: Medicare Other | Attending: Emergency Medicine | Admitting: Emergency Medicine

## 2017-03-03 ENCOUNTER — Encounter (HOSPITAL_COMMUNITY): Payer: Self-pay | Admitting: Nurse Practitioner

## 2017-03-03 DIAGNOSIS — Z7982 Long term (current) use of aspirin: Secondary | ICD-10-CM | POA: Insufficient documentation

## 2017-03-03 DIAGNOSIS — R29810 Facial weakness: Secondary | ICD-10-CM | POA: Diagnosis not present

## 2017-03-03 DIAGNOSIS — G510 Bell's palsy: Secondary | ICD-10-CM | POA: Insufficient documentation

## 2017-03-03 DIAGNOSIS — I110 Hypertensive heart disease with heart failure: Secondary | ICD-10-CM | POA: Diagnosis not present

## 2017-03-03 DIAGNOSIS — Z87891 Personal history of nicotine dependence: Secondary | ICD-10-CM | POA: Diagnosis not present

## 2017-03-03 DIAGNOSIS — I5032 Chronic diastolic (congestive) heart failure: Secondary | ICD-10-CM | POA: Diagnosis not present

## 2017-03-03 DIAGNOSIS — Z7902 Long term (current) use of antithrombotics/antiplatelets: Secondary | ICD-10-CM | POA: Insufficient documentation

## 2017-03-03 DIAGNOSIS — Z79899 Other long term (current) drug therapy: Secondary | ICD-10-CM | POA: Diagnosis not present

## 2017-03-03 LAB — I-STAT CHEM 8, ED
BUN: 13 mg/dL (ref 6–20)
CALCIUM ION: 1.17 mmol/L (ref 1.15–1.40)
CREATININE: 0.9 mg/dL (ref 0.44–1.00)
Chloride: 105 mmol/L (ref 101–111)
GLUCOSE: 135 mg/dL — AB (ref 65–99)
HCT: 35 % — ABNORMAL LOW (ref 36.0–46.0)
HEMOGLOBIN: 11.9 g/dL — AB (ref 12.0–15.0)
Potassium: 3.8 mmol/L (ref 3.5–5.1)
Sodium: 142 mmol/L (ref 135–145)
TCO2: 26 mmol/L (ref 22–32)

## 2017-03-03 LAB — CBC
HEMATOCRIT: 37.8 % (ref 36.0–46.0)
Hemoglobin: 12.4 g/dL (ref 12.0–15.0)
MCH: 30.9 pg (ref 26.0–34.0)
MCHC: 32.8 g/dL (ref 30.0–36.0)
MCV: 94.3 fL (ref 78.0–100.0)
Platelets: 244 10*3/uL (ref 150–400)
RBC: 4.01 MIL/uL (ref 3.87–5.11)
RDW: 12.5 % (ref 11.5–15.5)
WBC: 4.1 10*3/uL (ref 4.0–10.5)

## 2017-03-03 LAB — APTT: aPTT: 38 seconds — ABNORMAL HIGH (ref 24–36)

## 2017-03-03 LAB — DIFFERENTIAL
BASOS PCT: 1 %
Basophils Absolute: 0 10*3/uL (ref 0.0–0.1)
Eosinophils Absolute: 0.1 10*3/uL (ref 0.0–0.7)
Eosinophils Relative: 2 %
Lymphocytes Relative: 34 %
Lymphs Abs: 1.4 10*3/uL (ref 0.7–4.0)
MONO ABS: 0.4 10*3/uL (ref 0.1–1.0)
MONOS PCT: 11 %
NEUTROS ABS: 2.2 10*3/uL (ref 1.7–7.7)
Neutrophils Relative %: 52 %

## 2017-03-03 LAB — COMPREHENSIVE METABOLIC PANEL
ALK PHOS: 67 U/L (ref 38–126)
ALT: 25 U/L (ref 14–54)
AST: 29 U/L (ref 15–41)
Albumin: 3.8 g/dL (ref 3.5–5.0)
Anion gap: 7 (ref 5–15)
BUN: 11 mg/dL (ref 6–20)
CALCIUM: 8.9 mg/dL (ref 8.9–10.3)
CHLORIDE: 106 mmol/L (ref 101–111)
CO2: 28 mmol/L (ref 22–32)
Creatinine, Ser: 0.93 mg/dL (ref 0.44–1.00)
Glucose, Bld: 142 mg/dL — ABNORMAL HIGH (ref 65–99)
Potassium: 3.8 mmol/L (ref 3.5–5.1)
Sodium: 141 mmol/L (ref 135–145)
TOTAL PROTEIN: 6.3 g/dL — AB (ref 6.5–8.1)
Total Bilirubin: 0.6 mg/dL (ref 0.3–1.2)

## 2017-03-03 LAB — PROTIME-INR
INR: 1.11
Prothrombin Time: 14.2 seconds (ref 11.4–15.2)

## 2017-03-03 LAB — CBG MONITORING, ED: Glucose-Capillary: 133 mg/dL — ABNORMAL HIGH (ref 65–99)

## 2017-03-03 LAB — I-STAT TROPONIN, ED: TROPONIN I, POC: 0 ng/mL (ref 0.00–0.08)

## 2017-03-03 NOTE — ED Notes (Signed)
I discussed pt case with Dr. Corlis Leak. Orders for non-emergent stroke workup will remain, VO given for benadryl 25mg  PO in case of an allergic reaction, but the patient declines benadryl at this time as she does not feel like she is having any trouble swallowing or breathing.

## 2017-03-03 NOTE — ED Provider Notes (Signed)
[facility] DEPT Provider Note   CSN: [id number] Arrival date & time: 03/03/17  1103     History   Chief Complaint Chief Complaint  Patient presents with  . Stroke Symptoms    HPI [name] [name] is a 66 y.o. female.  HPI   Patient is a 66 year old female presenting with right facial droop. She noticed it this morning. For last several days she's been accidently biting her lip as well. Has numbness to the R cheek as well. No toruble with UE or LE.  Patient's had no recent upper respiratory illness. Has noticed no rash. Patient has history of hypertension hyperlipidemia and migraines. Patient has no headache currently.  Past Medical History:  Diagnosis Date  . Congestive heart failure (HCC)   . Fibromyalgia   . Fibromyalgia   . Hearing loss of both ears   . Hypertension   . Lung nodules   . Migraines    controlled with meds; down to once or twice yearly  . Osteoporosis     Patient Active Problem List   Diagnosis Date Noted  . Syncope 11/14/2016  . Chronic diastolic CHF (congestive heart failure) (HCC) 11/14/2016  . Dyspnea 12/10/2012  . Fibromyalgia   . Congestive heart failure (HCC)   . Hypertension   . Osteoporosis   . Essential hypertension, benign 03/09/2010  . Cardiomyopathy (HCC) 07/07/2009  . FATIGUE / MALAISE 07/07/2009    Past Surgical History:  Procedure Laterality Date  . BACK SURGERY  1986   ruptured disc  . BREAST SURGERY Right 1973   benign tumor  . fibromas removed    . HYSTEROSCOPY  11/98   with polypectomy  . TONSILLECTOMY     1960  . TUBAL LIGATION      OB History    Gravida Para Term Preterm AB Living   0 0 3   SAB TAB Ectopic Multiple Live Births   0 0 0 0 3       Home Medications    Prior to Admission medications   Medication Sig Start Date End Date Taking? Authorizing Provider  acetaminophen (TYLENOL) 500 MG tablet Take 1,000 mg by mouth at bedtime.    Yes [provider]  amLODipine (NORVASC) 5 MG tablet  Take 2.5 mg by mouth daily.   Yes [provider]  aspirin 81 MG chewable tablet Chew 81 mg by mouth at bedtime.    Yes [provider]  buPROPion (WELLBUTRIN SR) 150 MG 12 hr tablet Take 150 mg by mouth daily.    Yes [provider]  calcium carbonate (OS-CAL) 600 MG TABS Take 600 mg by mouth daily.    Yes [provider]  carvedilol (COREG) 12.5 MG tablet Take 1 tablet (12.5 mg total) by mouth 2 (two) times daily with a meal. 11/15/16  Yes Elgergawy, Leana Roe, MD  Cinnamon 500 MG TABS Take 500 mg by mouth daily.   Yes [provider]  Coenzyme Q10 (CO Q 10) 100 MG CAPS Take 100 mg by mouth 2 (two) times daily.    Yes [provider]  cyclobenzaprine (FLEXERIL) 10 MG tablet Take 10 mg by mouth at bedtime.    Yes [provider]  docusate sodium (COLACE) 100 MG capsule Take 100 mg by mouth daily.   Yes [provider]  fish oil-omega-3 fatty acids 1000 MG capsule Take 1 g by mouth 2 (two) times daily at 10 AM and 5 PM.     Yes  [provider]  furosemide (LASIX) 20 MG tablet Take 0.5 tablets (10 mg total) by mouth daily. Keep holding for 3 days, then resume on 5/28 Patient taking differently: Take 10 mg by mouth daily.  11/18/16  Yes Elgergawy, Leana Roe, MD  gabapentin (NEURONTIN) 100 MG capsule Take 200 mg by mouth at bedtime.  03/10/15  Yes [provider]  Hypromellose (ARTIFICIAL TEARS OP) Place 1 drop into both eyes daily as needed (dry eyes).   Yes [provider]  KLOR-CON M20 20 MEQ tablet Take 20 mEq by mouth every other day.  02/18/11  Yes [provider]  Manganese Gluconate 50 MG TABS Take 50 mg by mouth at bedtime.    Yes [provider]  Multiple Vitamin (MULTIVITAMIN) tablet Take 1 tablet by mouth daily.     Yes [provider]  pravastatin (PRAVACHOL) 40 MG tablet Take 40 mg by mouth 4 (four) times a week.    Yes [provider]  Riboflavin (VITAMIN B-2  PO) Take 1 tablet by mouth daily.   Yes [provider]  SUMAtriptan (IMITREX) 50 MG tablet Take 25-50 mg by mouth once as needed for migraines 03/27/16  Yes [provider]  TURMERIC PO Take 1 capsule by mouth daily.    Yes [provider]  Vitamin E (E 400 BLENDED PO) Take 400 mg by mouth 4 (four) times a week.    Yes [provider]  zolpidem (AMBIEN) 10 MG tablet Take 10 mg by mouth at bedtime as needed for sleep.  03/15/11  Yes [provider]    Family History Family History  Problem Relation Age of Onset  . Hypertension Mother   . Hypertension Father   . Colon cancer Neg Hx     Social History Social History  Substance Use Topics  . Smoking status: Former Smoker    Packs/day: 0.30    Types: Cigarettes    Quit date: 12/20/2014  . Smokeless tobacco: Never Used     Comment: 2 packs per week  . Alcohol use No     Allergies   Citrus; Silicon; Sulfa antibiotics; and Escitalopram oxalate   Review of Systems Review of Systems  Constitutional: Negative for activity change.  Respiratory: Negative for shortness of breath.   Cardiovascular: Negative for chest pain.  Gastrointestinal: Negative for abdominal pain.  Neurological: Positive for facial asymmetry.     Physical Exam Updated Vital Signs BP (!) 172/76   Pulse 76   Temp 97.9 F (36.6 C) (Oral)   Resp (!) 8   LMP 01/22/2001 (Approximate)   SpO2 100%   Physical Exam  Constitutional: She is oriented to person, place, and time. She appears well-developed and well-nourished.  HENT:  Head: Normocephalic and atraumatic.  Eyes: Right eye exhibits no discharge. Left eye exhibits no discharge.  Cardiovascular: Normal rate, regular rhythm and normal heart sounds.   No murmur heard. Pulmonary/Chest: Effort normal and breath sounds normal. She has no wheezes. She has no rales.  Abdominal: Soft. She exhibits no distension. There is no tenderness.  Neurological: She is oriented  to person, place, and time.  R facial droop. The forehead is partially involved. Numbness ot R cheek per patient. EOMI.   Skin: Skin is warm and dry. She is not diaphoretic.  Psychiatric: She has a normal mood and affect.  Nursing note and vitals reviewed.    ED Treatments / Results  Labs (all labs ordered are listed, but only abnormal results are  displayed) Labs Reviewed  APTT - Abnormal; Notable for the following:       Result Value   aPTT 38 (*)    All other components within normal limits  COMPREHENSIVE METABOLIC PANEL - Abnormal; Notable for the following:    Glucose, Bld 142 (*)    Total Protein 6.3 (*)    All other components within normal limits  CBG MONITORING, ED - Abnormal; Notable for the following:    Glucose-Capillary 133 (*)    All other components within normal limits  I-STAT CHEM 8, ED - Abnormal; Notable for the following:    Glucose, Bld 135 (*)    Hemoglobin 11.9 (*)    HCT 35.0 (*)    All other components within normal limits  PROTIME-INR  CBC  DIFFERENTIAL  I-STAT TROPONIN, ED    EKG  EKG Interpretation None       Radiology Ct Head Wo Contrast  Result Date: 03/03/2017 CLINICAL DATA:  Focal neurologic deficit, cannot open the right eyelid. Perioral numbness. EXAM: CT HEAD WITHOUT CONTRAST TECHNIQUE: Contiguous axial images were obtained from the base of the skull through the vertex without intravenous contrast. COMPARISON:  11/15/2016 FINDINGS: Brain: The brainstem, cerebellum, cerebral peduncles, thalami, basal ganglia, basilar cisterns, and ventricular system appear within normal limits. No intracranial hemorrhage, mass lesion, or acute CVA. Vascular: Unremarkable Skull: Unremarkable Sinuses/Orbits: Unremarkable Other: No supplemental non-categorized findings. IMPRESSION: 1.  No significant abnormality identified. Electronically Signed   By: Gaylyn Rong M.D.   On: 03/03/2017 13:08    Procedures Procedures (including critical care  time)  Medications Ordered in ED Medications - No data to display   Initial Impression / Assessment and Plan / ED Course  I have reviewed the triage vital signs and the nursing notes.  Pertinent labs & imaging results that were available during my care of the patient were reviewed by me and considered in my medical decision making (see chart for details).    Patient is a 66 year old female presenting with right facial droop. She noticed it this morning. For last several days she's been accidently biting her lip as well. Has numbness to the R cheek as well. No toruble with UE or LE.  Patient's had no recent upper respiratory illness. Has noticed no rash. Patient has history of hypertension hyperlipidemia and migraines. Patient has no headache currently.  3:52 PM  Patient's physical exam is most likely consistent with [name]. However does not completely align as a physical exam. Will get MRI to rule out strok but then likely discharge with BElls precautions.   Final Clinical Impressions(s) / ED Diagnoses   Final diagnoses:  None    New Prescriptions New Prescriptions   No medications on file     Abelino Derrick, MD 03/06/17 1034

## 2017-03-03 NOTE — ED Notes (Signed)
Pt verbalized understanding discharge instructions and denies any further needs or questions at this time. VS stable, ambulatory and steady gait.   

## 2017-03-03 NOTE — ED Triage Notes (Addendum)
Pt presents with c/o stroke symptoms. She woke this morning with numbness, drooping, and swelling to the right side of her face, slurred speech, and numbness to her right shoulder. She felt normal when she went to bed around 2300 last night. She feels like her symptoms are getting worse. She is concerned for an allergic reaction due to the swelling in her face, but denies any oral swelling, difficulty swallowing, hives, itching. She has been exposed to environmental irritants this week, as she is having work done in her home. She denies pain.

## 2017-03-03 NOTE — ED Provider Notes (Signed)
18: 00-patient evaluated at the request of Dr. Corlis Leak, regarding possible [name] versus CVA.  Ct Head Wo Contrast  Result Date: 03/03/2017 CLINICAL DATA:  Focal neurologic deficit, cannot open the right eyelid. Perioral numbness. EXAM: CT HEAD WITHOUT CONTRAST TECHNIQUE: Contiguous axial images were obtained from the base of the skull through the vertex without intravenous contrast. COMPARISON:  11/15/2016 FINDINGS: Brain: The brainstem, cerebellum, cerebral peduncles, thalami, basal ganglia, basilar cisterns, and ventricular system appear within normal limits. No intracranial hemorrhage, mass lesion, or acute CVA. Vascular: Unremarkable Skull: Unremarkable Sinuses/Orbits: Unremarkable Other: No supplemental non-categorized findings. IMPRESSION: 1.  No significant abnormality identified. Electronically Signed   By: Gaylyn Rong M.D.   On: 03/03/2017 13:08   Mr Brain Wo Contrast  Result Date: 03/03/2017 CLINICAL DATA:  66 y/o  F; right facial droop. EXAM: MRI HEAD WITHOUT CONTRAST TECHNIQUE: Multiplanar, multiecho pulse sequences of the brain and surrounding structures were obtained without intravenous contrast. COMPARISON:  03/03/2017 CT head FINDINGS: Brain: No acute infarction, hemorrhage, hydrocephalus, extra-axial collection or mass lesion. Few nonspecific foci of T2 FLAIR hyperintense signal abnormality in subcortical and periventricular white matter is compatible with mild chronic microvascular ischemic changes for age. Mild brain parenchymal volume loss. Vascular: Normal flow voids. Skull and upper cervical spine: Normal marrow signal. Sinuses/Orbits: Negative. Other: None. IMPRESSION: 1. No acute intracranial abnormality. 2. Mild chronic microvascular ischemic changes and mild parenchymal volume loss of the brain. Electronically Signed   By: Mitzi Hansen M.D.   On: 03/03/2017 16:58     Patient Vitals for the past 24 hrs:  BP Temp Temp src Pulse Resp SpO2  03/03/17 1800  (!) 150/82 - - 79 13 98 %  03/03/17 1745 (!) 171/93 - - 84 (!) 7 98 %  03/03/17 1500 (!) 148/76 - - 77 13 98 %  03/03/17 1250 (!) 172/76 - - 76 (!) 8 100 %  03/03/17 1111 (!) 183/84 97.9 F (36.6 C) Oral 90 16 100 %    6:13 PM Reevaluation with update and discussion. After initial assessment and treatment, an updated evaluation reveals she is comfortable now able to eat well.  She is able to voluntarily close her right eye completely.  She has been using eye lubricating drops since her recent cataract surgery.  Findings discussed with patient and her husband, all questions answered. Zakhi Dupre L    Medical decision-making-evaluation is consistent with [name].  Doubt CVA, metabolic instability or impending vascular collapse.  Patient has been instructed about eye protection, and instructed to follow-up with PCP as needed as well as with a neurologist for ongoing management of [name].     Mancel Bale, MD 03/03/17 [phone]

## 2017-03-03 NOTE — Discharge Instructions (Signed)
Get plenty of rest and try to eat and drink a regular diet.  Use your lubricating eyedrops, frequently to prevent dry eye, or injury to the right eye.  At nighttime make sure that you are holding her right eyelid shut, with either a patch or by directly taping the eyelid closed.  This will help keep the eye moisture, and tend to prevent injury to the eye.

## 2017-03-04 ENCOUNTER — Encounter: Payer: Self-pay | Admitting: Neurology

## 2017-03-04 ENCOUNTER — Telehealth: Payer: Self-pay | Admitting: Neurology

## 2017-03-04 ENCOUNTER — Ambulatory Visit (INDEPENDENT_AMBULATORY_CARE_PROVIDER_SITE_OTHER): Payer: Medicare Other | Admitting: Neurology

## 2017-03-04 VITALS — BP 129/73 | HR 79 | Ht 59.0 in | Wt 128.8 lb

## 2017-03-04 DIAGNOSIS — G510 Bell's palsy: Secondary | ICD-10-CM | POA: Diagnosis not present

## 2017-03-04 DIAGNOSIS — R202 Paresthesia of skin: Secondary | ICD-10-CM | POA: Diagnosis not present

## 2017-03-04 DIAGNOSIS — G527 Disorders of multiple cranial nerves: Secondary | ICD-10-CM

## 2017-03-04 DIAGNOSIS — R200 Anesthesia of skin: Secondary | ICD-10-CM

## 2017-03-04 DIAGNOSIS — R51 Headache: Secondary | ICD-10-CM | POA: Diagnosis not present

## 2017-03-04 DIAGNOSIS — H5360 Unspecified night blindness: Secondary | ICD-10-CM

## 2017-03-04 DIAGNOSIS — R519 Headache, unspecified: Secondary | ICD-10-CM

## 2017-03-04 MED ORDER — PREDNISONE 20 MG PO TABS: ORAL_TABLET | ORAL | 0 refills | Status: DC

## 2017-03-04 MED ORDER — VALACYCLOVIR HCL 1 G PO TABS: 1000.0000 mg | ORAL_TABLET | Freq: Three times a day (TID) | ORAL | 0 refills | Status: DC

## 2017-03-04 NOTE — Telephone Encounter (Signed)
Patient will see Dr. Randon Goldsmith today at 1:15 for her eye . Thanks Annabelle Harman.

## 2017-03-04 NOTE — Telephone Encounter (Signed)
Spoke to pt and relayed that she is to take all pills at one time (4 tabs- 80mg ).   She can start tonight or tomorrow.   Common SE, insomnia, increased appetite, energy.  Call for questions or concerns. She verbalized understanding.

## 2017-03-04 NOTE — Progress Notes (Signed)
[facility]    Provider:  Dr Lucia Gaskins Referring Provider: Rodrigo Ran, MD Primary Care Physician:  Rodrigo Ran, MD  CC:  Bells palsy  HPI:  [name] [name] is a 66 y.o. female here as a referral from Dr. Waynard Edwards for [name]. She woke up with it in the morning. She noticed right facial droop. She had a slight headache for a few days and some right cervical muscle pain. She is having difficulty blinking her right eye and it is getting dr and hurting and vision is blurry. No difference in taste. No hearing changes. Acute in onset. She also reports some tingling.Worsening, speehc is slurred and numbness on the right side of the face. No weakness, no extremity changes. She can't lift that eyebrow either. Difficult to chew, drooling. She has a headache and the eye feels dry. She is putting drops in the eyes frequently. She sees Dr. Randon Goldsmith. Started yesterday morning. No previous illnesses or inciting events.No other focal neurologic deficits, associated symptoms, inciting events or modifiable factors. Patient does endorse sensory changes on the right side of the face. Patient endorses headache as well different than her migraines as well as blurry vision/worsening vision of right eye. She denies any tick bites or being exposed any tics.  Reviewed notes, labs and imaging from outside physicians, which showed: MRI of the brain was performed without contrast which showed no acute intracranial abnormality with only mild chronic microvascular ischemic changes and age-appropriate parenchymal volume loss.  Chem8 showed elevated glucose at 135, hemoglobin 11.9, hematocrit 35 otherwise unremarkable,  Review of Systems: Patient complains of symptoms per HPI as well as the following symptoms: Numbness, tingling, facial droop. Pertinent negatives and positives per HPI. All others negative.   Social History   Social History  . Marital status: Married    Spouse name: N/A  . Number of children:  3  . Years of education: N/A   Occupational History  . SALES REP [employer]   Social History Main Topics  . Smoking status: Former Smoker    Packs/day: 0.30    Types: Cigarettes    Quit date: 12/20/2014  . Smokeless tobacco: Never Used     Comment: 2 packs per week  . Alcohol use No  . Drug use: No  . Sexual activity: Yes    Partners: Male    Birth control/ protection: Post-menopausal   Other Topics Concern  . Not on file   Social History Narrative  . No narrative on file    Family History  Problem Relation Age of Onset  . Hypertension Mother   . Hypertension Father   . Colon cancer Neg Hx     Past Medical History:  Diagnosis Date  . Congestive heart failure (HCC)   . Fibromyalgia   . Fibromyalgia   . Hearing loss of both ears   . Hypertension   . Lung nodules   . Migraines    controlled with meds; down to once or twice yearly  . Osteoporosis     Past Surgical History:  Procedure Laterality Date  . BACK SURGERY  1986   ruptured disc  . BREAST SURGERY Right 1973   benign tumor  . fibromas removed    . HYSTEROSCOPY  11/98   with polypectomy  . TONSILLECTOMY     1960  . TUBAL LIGATION      Current Outpatient Prescriptions  Medication Sig Dispense Refill  . acetaminophen (TYLENOL) 500 MG tablet Take 1,000 mg by  mouth at bedtime.     Marland Kitchen amLODipine (NORVASC) 5 MG tablet Take 2.5 mg by mouth daily.    Marland Kitchen aspirin 81 MG chewable tablet Chew 81 mg by mouth at bedtime.     Marland Kitchen buPROPion (WELLBUTRIN SR) 150 MG 12 hr tablet Take 150 mg by mouth daily.     . calcium carbonate (OS-CAL) 600 MG TABS Take 600 mg by mouth daily.     . carvedilol (COREG) 12.5 MG tablet Take 1 tablet (12.5 mg total) by mouth 2 (two) times daily with a meal. 60 tablet 0  . Cinnamon 500 MG TABS Take 500 mg by mouth daily.    . Coenzyme Q10 (CO Q 10) 100 MG CAPS Take 100 mg by mouth 2 (two) times daily.     . cyclobenzaprine (FLEXERIL) 10 MG tablet Take 10 mg by mouth at bedtime.      . docusate sodium (COLACE) 100 MG capsule Take 100 mg by mouth daily.    . fish oil-omega-3 fatty acids 1000 MG capsule Take 1 g by mouth 2 (two) times daily at 10 AM and 5 PM.      . furosemide (LASIX) 20 MG tablet Take 0.5 tablets (10 mg total) by mouth daily. Keep holding for 3 days, then resume on 5/28 (Patient taking differently: Take 10 mg by mouth daily. ) 30 tablet   . gabapentin (NEURONTIN) 100 MG capsule Take 200 mg by mouth at bedtime.   11  . Hypromellose (ARTIFICIAL TEARS OP) Place 1 drop into both eyes daily as needed (dry eyes).    Marland Kitchen KLOR-CON M20 20 MEQ tablet Take 20 mEq by mouth every other day.     . Manganese Gluconate 50 MG TABS Take 50 mg by mouth at bedtime.     . Multiple Vitamin (MULTIVITAMIN) tablet Take 1 tablet by mouth daily.      . pravastatin (PRAVACHOL) 40 MG tablet Take 40 mg by mouth 4 (four) times a week.     . Riboflavin (VITAMIN B-2 PO) Take 1 tablet by mouth daily.    . SUMAtriptan (IMITREX) 50 MG tablet Take 25-50 mg by mouth once as needed for migraines  3  . TURMERIC PO Take 1 capsule by mouth daily.     . Vitamin E (E 400 BLENDED PO) Take 400 mg by mouth 4 (four) times a week.     . zolpidem (AMBIEN) 10 MG tablet Take 10 mg by mouth at bedtime as needed for sleep.     . predniSONE (DELTASONE) 20 MG tablet Take 80mg (4 pill) for 7 days daily. Taper down as follows: Day8: 60mg , Day9: 40mg , Day10: 20mg : [id number]: 10mg  daily.Take with breakfast 35 tablet 0  . valACYclovir (VALTREX) 1000 MG tablet Take 1 tablet (1,000 mg total) by mouth 3 (three) times daily. 21 tablet 0   No current facility-administered medications for this visit.     Allergies as of 03/04/2017 - Review Complete 03/04/2017  Allergen Reaction Noted  . Citrus Other (See Comments) 11/14/2016  . Silicon Swelling 03/03/2017  . Sulfa antibiotics Nausea And Vomiting 04/12/2011  . Escitalopram oxalate Other (See Comments)     Vitals: BP 129/73 (BP Location: Right Arm, Patient Position:  Sitting, Cuff Size: Small)   Pulse 79   Ht 4\' 11"  (1.499 m)   Wt 128 lb 12.8 oz (58.4 kg)   LMP 01/22/2001 (Approximate)   BMI 26.01 kg/m  Last Weight:  Wt Readings from Last 1 Encounters:  03/04/17 128 lb 12.8 oz (  58.4 kg)   Last Height:   Ht Readings from Last 1 Encounters:  03/04/17  (1.499 m)    Physical exam: Exam: Gen: NAD, conversant, well nourised, well groomed                     CV: RRR, no MRG. No Carotid Bruits. No peripheral edema, warm, nontender Eyes: Conjunctivae clear without exudates or hemorrhage  Neuro: Detailed Neurologic Exam  Speech:    Speech is normal; fluent and spontaneous with normal comprehension.  Cognition:    The patient is oriented to person, place, and time;     recent and remote memory intact;     language fluent;     normal attention, concentration,     fund of knowledge Cranial Nerves:    The pupils are equal, round, and reactive to light. Attempted funduscopic exam could not visualize. Visual fields are full to finger confrontation. Extraocular movements are intact. Trigeminal sensation is impaired to pinprick on the right. Decreased blink right, impaired right eye closure, Right-sided facial weakness including lower and upper: Cannot raise eyebrow on the right along with significant right lower facial drooping. The palate elevates in the midline. Hearing impaired. Voice is normal. Shoulder shrug is normal. The tongue has normal motion without fasciculations.   Coordination:    No dysmetria  Gait:    Normal native gait  Motor Observation:    No asymmetry, no atrophy, and no involuntary movements noted. Tone:    Normal muscle tone.    Posture:    Posture is normal. normal erect    Strength:    Strength is V/V in the upper and lower limbs.      Sensation: intact to LT     Reflex Exam:  DTR's:    Deep tendon reflexes in the upper and lower extremities are symmetrical bilaterally.   Toes:    The toes are downgoing  bilaterally.   Clonus:    Clonus is absent.      Assessment/Plan:  This is a 66 year old female with acute onset right facial droop, exam shows decreased blink right, impaired right eye closure, Right-sided facial weakness including lower and upper: Cannot raise eyebrow on the right along with significant right lower facial drooping. Likely [name] however patient reports numbness to pinprick, paresthesias of the right lower face which is not consistent with just a bells palsy as it would not involve cranial nerve V need to order MRI of the brain with contrast (original MRI was without) to look for enhancement of cranial nerves or leptimeningeal enhancement that might shed light on etiology.   -MRI with contrast due to multiple cranial nerve involvement that is not consistent with [name] as well as new onset headache right vision loss.(original MRI was without) to look for enhancement of cranial nerves or leptimeningeal enhancement that might shed light on etiology. Also need to examine MRI of the brain for stroke again especially in the brainstem..   - Start high-dose steroids 80 mg with taper over 12 days and Valtrex 1 g 3 times a day. Patient have a significant right eye pain, may just be due to dry eye however worrisome for corneal injury and she will be seeing frequent eyedrops and eye gel, the risks of corneal damage needs to wear protective glasses.  - Reassured patient that most people recover but it takes time.  [name] Dean, MD  [facility] [address] [place], [place] [address]  Phone [phone] Fax [phone]  

## 2017-03-04 NOTE — Patient Instructions (Addendum)
Bell Palsy, Adult Bell palsy is a short-term inability to move muscles in part of the face. The inability to move (paralysis) results from inflammation or compression of the facial nerve, which travels along the skull and under the ear to the side of the face (7th cranial nerve). This nerve is responsible for facial movements that include blinking, closing the eyes, smiling, and frowning. What are the causes? The exact cause of this condition is not known. It may be caused by an infection from a virus, such as the chickenpox (herpes zoster), Epstein-Barr, or mumps virus. What increases the risk? You are more likely to develop this condition if:  You are pregnant.  You have diabetes.  You have had a recent infection in your nose, throat, or airways (upper respiratory infection).  You have a weakened body defense system (immune system).  You have had a facial injury, such as a fracture.  You have a family history of Bell palsy.  What are the signs or symptoms? Symptoms of this condition include:  Weakness on one side of the face.  Drooping eyelid and corner of the mouth.  Excessive tearing in one eye.  Difficulty closing the eyelid.  Dry eye.  Drooling.  Dry mouth.  Changes in taste.  Change in facial appearance.  Pain behind one ear.  Ringing in one or both ears.  Sensitivity to sound in one ear.  Facial twitching.  Headache.  Impaired speech.  Dizziness.  Difficulty eating or drinking.  Most of the time, only one side of the face is affected. Rarely, Bell palsy affects the whole face. How is this diagnosed? This condition is diagnosed based on:  Your symptoms.  Your medical history.  A physical exam.  You may also have to see health care providers who specialize in disorders of the nerves (neurologist) or diseases and conditions of the eye (ophthalmologist). You may have tests, such as:  A test to check for nerve damage  (electromyogram).  Imaging studies, such as CT or MRI scans.  Blood tests.  How is this treated? This condition affects every person differently. Sometimes symptoms go away without treatment within a couple weeks. If treatment is needed, it varies from person to person. The goal of treatment is to reduce inflammation and protect the eye from damage. Treatment for Bell palsy may include:  Medicines, such as: ? Steroids to reduce swelling and inflammation. ? Antiviral drugs. ? Pain relievers, including aspirin, acetaminophen, or ibuprofen.  Eye drops or ointment to keep your eye moist.  Eye protection, if you cannot close your eye.  Exercises or massage to regain muscle strength and function (physical therapy).  Follow these instructions at home:  Take over-the-counter and prescription medicines only as told by your health care provider.  If your eye is affected: ? Keep your eye moist with eye drops or ointment as told by your health care provider. ? Follow instructions for eye care and protection as told by your health care provider.  Do any physical therapy exercises as told by your health care provider.  Keep all follow-up visits as told by your health care provider. This is important. Contact a health care provider if:  You have a fever.  Your symptoms do not get better within 2-3 weeks, or your symptoms get worse.  Your eye is red, irritated, or painful.  You have new symptoms. Get help right away if:  You have weakness or numbness in a part of your body other than  your face.  You have trouble swallowing.  You develop neck pain or stiffness.  You develop dizziness or shortness of breath. Summary  Bell palsy is a short-term inability to move muscles in part of the face. The inability to move (paralysis) results from inflammation or compression of the facial nerve.  This condition affects every person differently. Sometimes symptoms go away without treatment  within a couple weeks.  If treatment is needed, it varies from person to person. The goal of treatment is to reduce inflammation and protect the eye from damage.  Contact your health care provider if your symptoms do not get better within 2-3 weeks, or your symptoms get worse. This information is not intended to replace advice given to you by your health care provider. Make sure you discuss any questions you have with your health care provider. Document Released: 06/10/2005 Document Revised: 08/13/2016 Document Reviewed: 08/13/2016 Elsevier Interactive Patient Education  2018 [employer].    Valacyclovir caplets What is this medicine? VALACYCLOVIR (val ay SYE kloe veer) is an antiviral medicine. It is used to treat or prevent infections caused by certain kinds of viruses. Examples of these infections include herpes and shingles. This medicine will not cure herpes. This medicine may be used for other purposes; ask your health care provider or pharmacist if you have questions. COMMON BRAND NAME(S): Valtrex What should I tell my health care provider before I take this medicine? They need to know if you have any of these conditions: -acquired immunodeficiency syndrome (AIDS) -any other condition that may weaken the immune system -bone marrow or kidney transplant -kidney disease -an unusual or allergic reaction to valacyclovir, acyclovir, ganciclovir, valganciclovir, other medicines, foods, dyes, or preservatives -pregnant or trying to get pregnant -breast-feeding How should I use this medicine? Take this medicine by mouth with a glass of water. Follow the directions on the prescription label. You can take this medicine with or without food. Take your doses at regular intervals. Do not take your medicine more often than directed. Finish the full course prescribed by your doctor or health care professional even if you think your condition is better. Do not stop taking except on the advice of  your doctor or health care professional. Talk to your pediatrician regarding the use of this medicine in children. While this drug may be prescribed for children as young as 2 years for selected conditions, precautions do apply. Overdosage: If you think you have taken too much of this medicine contact a poison control center or emergency room at once. NOTE: This medicine is only for you. Do not share this medicine with others. What if I miss a dose? If you miss a dose, take it as soon as you can. If it is almost time for your next dose, take only that dose. Do not take double or extra doses. What may interact with this medicine? -cimetidine -probenecid This list may not describe all possible interactions. Give your health care provider a list of all the medicines, herbs, non-prescription drugs, or dietary supplements you use. Also tell them if you smoke, drink alcohol, or use illegal drugs. Some items may interact with your medicine. What should I watch for while using this medicine? Tell your doctor or health care professional if your symptoms do not start to get better after 1 week. This medicine works best when taken early in the course of an infection, within the first 72 hours. Begin treatment as soon as possible after the first signs of infection like  tingling, itching, or pain in the affected area. It is possible that genital herpes may still be spread even when you are not having symptoms. Always use safer sex practices like condoms made of latex or polyurethane whenever you have sexual contact. You should stay well hydrated while taking this medicine. Drink plenty of fluids. What side effects may I notice from receiving this medicine? Side effects that you should report to your doctor or health care professional as soon as possible: -allergic reactions like skin rash, itching or hives, swelling of the face, lips, or tongue -aggressive behavior -confusion -hallucinations -problems with  balance, talking, walking -stomach pain -tremor -trouble passing urine or change in the amount of urine Side effects that usually do not require medical attention (report to your doctor or health care professional if they continue or are bothersome): -dizziness -headache -nausea, vomiting This list may not describe all possible side effects. Call your doctor for medical advice about side effects. You may report side effects to FDA at 1-800-FDA-1088. Where should I keep my medicine? Keep out of the reach of children. Store at room temperature between 15 and 25 degrees C (59 and 77 degrees F). Keep container tightly closed. Throw away any unused medicine after the expiration date. NOTE: This sheet is a summary. It may not cover all possible information. If you have questions about this medicine, talk to your doctor, pharmacist, or health care provider.  2018 Elsevier/Gold Standard (2012-05-26 16:34:05)    Prednisone tablets What is this medicine? PREDNISONE (PRED ni sone) is a corticosteroid. It is commonly used to treat inflammation of the skin, joints, lungs, and other organs. Common conditions treated include asthma, allergies, and arthritis. It is also used for other conditions, such as blood disorders and diseases of the adrenal glands. This medicine may be used for other purposes; ask your health care provider or pharmacist if you have questions. COMMON BRAND NAME(S): Deltasone, Predone, Sterapred, Sterapred DS What should I tell my health care provider before I take this medicine? They need to know if you have any of these conditions: -Cushing's syndrome -diabetes -glaucoma -heart disease -high blood pressure -infection (especially a virus infection such as chickenpox, cold sores, or herpes) -kidney disease -liver disease -mental illness -myasthenia gravis -osteoporosis -seizures -stomach or intestine problems -thyroid disease -an unusual or allergic reaction to lactose,  prednisone, other medicines, foods, dyes, or preservatives -pregnant or trying to get pregnant -breast-feeding How should I use this medicine? Take this medicine by mouth with a glass of water. Follow the directions on the prescription label. Take this medicine with food. If you are taking this medicine once a day, take it in the morning. Do not take more medicine than you are told to take. Do not suddenly stop taking your medicine because you may develop a severe reaction. Your doctor will tell you how much medicine to take. If your doctor wants you to stop the medicine, the dose may be slowly lowered over time to avoid any side effects. Talk to your pediatrician regarding the use of this medicine in children. Special care may be needed. Overdosage: If you think you have taken too much of this medicine contact a poison control center or emergency room at once. NOTE: This medicine is only for you. Do not share this medicine with others. What if I miss a dose? If you miss a dose, take it as soon as you can. If it is almost time for your next dose, talk to your doctor or  health care professional. You may need to miss a dose or take an extra dose. Do not take double or extra doses without advice. What may interact with this medicine? Do not take this medicine with any of the following medications: -metyrapone -mifepristone This medicine may also interact with the following medications: -aminoglutethimide -amphotericin B -aspirin and aspirin-like medicines -barbiturates -certain medicines for diabetes, like glipizide or glyburide -cholestyramine -cholinesterase inhibitors -cyclosporine -digoxin -diuretics -ephedrine -female hormones, like estrogens and birth control pills -isoniazid -ketoconazole -NSAIDS, medicines for pain and inflammation, like ibuprofen or naproxen -phenytoin -rifampin -toxoids -vaccines -warfarin This list may not describe all possible interactions. Give your  health care provider a list of all the medicines, herbs, non-prescription drugs, or dietary supplements you use. Also tell them if you smoke, drink alcohol, or use illegal drugs. Some items may interact with your medicine. What should I watch for while using this medicine? Visit your doctor or health care professional for regular checks on your progress. If you are taking this medicine over a prolonged period, carry an identification card with your name and address, the type and dose of your medicine, and your doctor's name and address. This medicine may increase your risk of getting an infection. Tell your doctor or health care professional if you are around anyone with measles or chickenpox, or if you develop sores or blisters that do not heal properly. If you are going to have surgery, tell your doctor or health care professional that you have taken this medicine within the last twelve months. Ask your doctor or health care professional about your diet. You may need to lower the amount of salt you eat. This medicine may affect blood sugar levels. If you have diabetes, check with your doctor or health care professional before you change your diet or the dose of your diabetic medicine. What side effects may I notice from receiving this medicine? Side effects that you should report to your doctor or health care professional as soon as possible: -allergic reactions like skin rash, itching or hives, swelling of the face, lips, or tongue -changes in emotions or moods -changes in vision -depressed mood -eye pain -fever or chills, cough, sore throat, pain or difficulty passing urine -increased thirst -swelling of ankles, feet Side effects that usually do not require medical attention (report to your doctor or health care professional if they continue or are bothersome): -confusion, excitement, restlessness -headache -nausea, vomiting -skin problems, acne, thin and shiny skin -trouble  sleeping -weight gain This list may not describe all possible side effects. Call your doctor for medical advice about side effects. You may report side effects to FDA at 1-800-FDA-1088. Where should I keep my medicine? Keep out of the reach of children. Store at room temperature between 15 and 30 degrees C (59 and 86 degrees F). Protect from light. Keep container tightly closed. Throw away any unused medicine after the expiration date. NOTE: This sheet is a summary. It may not cover all possible information. If you have questions about this medicine, talk to your doctor, pharmacist, or health care provider.  2018 Elsevier/Gold Standard (2011-01-24 10:57:14)

## 2017-03-04 NOTE — Telephone Encounter (Signed)
Patient calling regarding dosage of predniSONE (DELTASONE) 20 MG tablet.

## 2017-03-06 DIAGNOSIS — G510 Bell's palsy: Secondary | ICD-10-CM | POA: Diagnosis not present

## 2017-03-10 DIAGNOSIS — G510 Bell's palsy: Secondary | ICD-10-CM | POA: Diagnosis not present

## 2017-03-17 DIAGNOSIS — G510 Bell's palsy: Secondary | ICD-10-CM | POA: Diagnosis not present

## 2017-03-21 ENCOUNTER — Telehealth: Payer: Self-pay | Admitting: Neurology

## 2017-03-21 NOTE — Telephone Encounter (Signed)
Spoke with patient who stated that she did not have this ear pain when she saw Dr Lucia Gaskins 03/04/17. She stated she took Aleve and Tylenol 1000 mg last night with no relief. She feels better now. She stated that she isn't supposed to take NSAIDS due to history of CHF, but she does take it occasionally. She stated that her MRI is 04/02/17, and she doesn't have a follow up with Dr Lucia Gaskins. This RN advised that Dr Lucia Gaskins will wait for MRI results then determine when is best for her to follow up.  Advised patient she may want to call her PCP to discuss. Advised that she may need to give steroid more time to be helpful with her symptoms.   Advised her this office has doctor on call on weekends, and that this RN will discuss with Dr Lucia Gaskins and call her back if any further information. She verbalized understanding, appreciation. Discussed with Dr Lucia Gaskins who agreed with plan.

## 2017-03-21 NOTE — Telephone Encounter (Signed)
Pt called in she is exeperiencing pain in the back of the head behind the ear-(sharp pain) and pain in the rt ear. She took advil last night but it did not help. Pt said she could not sleep last night the pain was so bad. Please call. She is aware the clinic closes at noon today but she is wanting to RN to call her back

## 2017-03-24 DIAGNOSIS — G510 Bell's palsy: Secondary | ICD-10-CM | POA: Diagnosis not present

## 2017-04-02 ENCOUNTER — Ambulatory Visit
Admission: RE | Admit: 2017-04-02 | Discharge: 2017-04-02 | Disposition: A | Discharge status: Home / Self Care | Payer: Medicare Other | Source: Ambulatory Visit | Attending: Neurology | Admitting: Neurology

## 2017-04-02 DIAGNOSIS — H5360 Unspecified night blindness: Secondary | ICD-10-CM

## 2017-04-02 DIAGNOSIS — G510 Bell's palsy: Secondary | ICD-10-CM

## 2017-04-02 DIAGNOSIS — R519 Headache, unspecified: Secondary | ICD-10-CM

## 2017-04-02 DIAGNOSIS — R51 Headache: Secondary | ICD-10-CM

## 2017-04-02 DIAGNOSIS — R202 Paresthesia of skin: Secondary | ICD-10-CM

## 2017-04-02 DIAGNOSIS — G527 Disorders of multiple cranial nerves: Secondary | ICD-10-CM

## 2017-04-02 DIAGNOSIS — R200 Anesthesia of skin: Secondary | ICD-10-CM

## 2017-04-02 MED ORDER — GADOBENATE DIMEGLUMINE 529 MG/ML IV SOLN
10.0000 mL | Freq: Once | INTRAVENOUS | Status: AC | PRN
  Administered 2017-04-02: 10 mL via INTRAVENOUS

## 2017-04-07 DIAGNOSIS — G510 Bell's palsy: Secondary | ICD-10-CM | POA: Diagnosis not present

## 2017-04-10 ENCOUNTER — Telehealth: Payer: Self-pay

## 2017-04-10 NOTE — Telephone Encounter (Signed)
I spoke with patient and she is aware of the normal results. She is asking if there is any sort of physical or massage therapy that she should be doing? She says the drooping is some better, she can lift her eyebrow a little, but she is still having to tape her eye down at night. She has been seeing her eye doctor every week, last visit was this past Monday, and now she will start going every 2 weeks. Patient is frustrated and wants to do whatever she can to get "back to normal." Please advise.

## 2017-04-10 NOTE — Telephone Encounter (Signed)
-----   Message from Anson Fret, MD sent at 04/08/2017  9:42 AM EDT ----- MRI of the brain is normal for age thanks

## 2017-04-13 NOTE — Telephone Encounter (Signed)
[name] [name], could you please call physical therapy and ask if they have any specific therapy for this patient with [name]? Let me know please thanks

## 2017-04-16 ENCOUNTER — Other Ambulatory Visit: Payer: Self-pay | Admitting: Neurology

## 2017-04-16 DIAGNOSIS — G510 Bell's palsy: Secondary | ICD-10-CM

## 2017-04-16 NOTE — Telephone Encounter (Signed)
Thanks, placed! Toma Copier, fyi thanks

## 2017-04-16 NOTE — Telephone Encounter (Signed)
Dr. Lucia Gaskins , I called and spoke to [facility] Physical  Therapist at [facility] she can work with patient with her Bells Palsy. I have spoke to patient and she is willing to go.   Dr. Lucia Gaskins Please place a referral for physical therapy / Bells Palsy.

## 2017-04-16 NOTE — Telephone Encounter (Signed)
Referral sent 

## 2017-04-21 DIAGNOSIS — G510 Bell's palsy: Secondary | ICD-10-CM | POA: Diagnosis not present

## 2017-04-24 DIAGNOSIS — G510 Bell's palsy: Secondary | ICD-10-CM | POA: Diagnosis not present

## 2017-04-24 DIAGNOSIS — M6281 Muscle weakness (generalized): Secondary | ICD-10-CM | POA: Diagnosis not present

## 2017-04-29 ENCOUNTER — Telehealth: Payer: Self-pay | Admitting: *Deleted

## 2017-04-29 NOTE — Telephone Encounter (Signed)
PT plan of care papers signed & faxed to [facility]. Received a receipt of confirmation.

## 2017-05-01 DIAGNOSIS — G510 Bell's palsy: Secondary | ICD-10-CM | POA: Diagnosis not present

## 2017-05-01 DIAGNOSIS — M6281 Muscle weakness (generalized): Secondary | ICD-10-CM | POA: Diagnosis not present

## 2017-05-08 DIAGNOSIS — G510 Bell's palsy: Secondary | ICD-10-CM | POA: Diagnosis not present

## 2017-05-08 DIAGNOSIS — M6281 Muscle weakness (generalized): Secondary | ICD-10-CM | POA: Diagnosis not present

## 2017-05-12 DIAGNOSIS — G510 Bell's palsy: Secondary | ICD-10-CM | POA: Diagnosis not present

## 2017-05-21 ENCOUNTER — Other Ambulatory Visit: Payer: Self-pay | Admitting: Nurse Practitioner

## 2017-05-21 ENCOUNTER — Other Ambulatory Visit: Payer: Self-pay | Admitting: Internal Medicine

## 2017-05-21 DIAGNOSIS — Z1231 Encounter for screening mammogram for malignant neoplasm of breast: Secondary | ICD-10-CM

## 2017-05-22 DIAGNOSIS — M6281 Muscle weakness (generalized): Secondary | ICD-10-CM | POA: Diagnosis not present

## 2017-05-22 DIAGNOSIS — G510 Bell's palsy: Secondary | ICD-10-CM | POA: Diagnosis not present

## 2017-06-05 DIAGNOSIS — G510 Bell's palsy: Secondary | ICD-10-CM | POA: Diagnosis not present

## 2017-06-05 DIAGNOSIS — M6281 Muscle weakness (generalized): Secondary | ICD-10-CM | POA: Diagnosis not present

## 2017-06-09 NOTE — Telephone Encounter (Signed)
Faxed signed PT plan of care papers to [facility]. Received a receipt of confirmation.

## 2017-06-12 DIAGNOSIS — G510 Bell's palsy: Secondary | ICD-10-CM | POA: Diagnosis not present

## 2017-06-12 DIAGNOSIS — M6281 Muscle weakness (generalized): Secondary | ICD-10-CM | POA: Diagnosis not present

## 2017-06-13 DIAGNOSIS — G510 Bell's palsy: Secondary | ICD-10-CM | POA: Diagnosis not present

## 2017-06-27 ENCOUNTER — Ambulatory Visit
Admission: RE | Admit: 2017-06-27 | Discharge: 2017-06-27 | Disposition: A | Discharge status: Home / Self Care | Payer: Medicare Other | Source: Ambulatory Visit | Attending: Internal Medicine | Admitting: Internal Medicine

## 2017-06-27 DIAGNOSIS — Z1231 Encounter for screening mammogram for malignant neoplasm of breast: Secondary | ICD-10-CM

## 2017-07-04 ENCOUNTER — Telehealth: Payer: Self-pay | Admitting: *Deleted

## 2017-07-09 NOTE — Telephone Encounter (Signed)
I contacted [facility] and was informed Dr. Waynard Edwards was out of the office and was transferred to Dr. Timothy Lasso' Nurse Austin Miles' voicemail and left a message.

## 2017-07-09 NOTE — Telephone Encounter (Signed)
I spoke with [facility] [place], she transferred me to [facility]. [facility]. States if pt comes to their office they would send her straight to the ED, that would be what Dr. Waynard Edwards would do also.

## 2017-07-09 NOTE — Telephone Encounter (Signed)
Dr. Ardelle Anton states Tomasa Hose, RN is in the bathroom with today's pt [name] [name] his wife,[name] that felt faint, and refuses to go to ED, is a Dr. Waynard Edwards pt, please contact for appt today. Tomasa Hose, RN states [name] BP 94/40 refuses EMS transport to ED.

## 2017-07-09 NOTE — Telephone Encounter (Signed)
I informed Stepanie and Casimiro Needle at [facility] [employer] [facility] [employer] Statement that they would refer her to the ED. Cloe states she will not go to the ED and Casimiro Needle states he will take her home. Tomasa Hose, RN states Faduma's BP is better.

## 2017-07-10 DIAGNOSIS — M6281 Muscle weakness (generalized): Secondary | ICD-10-CM | POA: Diagnosis not present

## 2017-07-10 DIAGNOSIS — G510 Bell's palsy: Secondary | ICD-10-CM | POA: Diagnosis not present

## 2017-07-11 DIAGNOSIS — G510 Bell's palsy: Secondary | ICD-10-CM | POA: Diagnosis not present

## 2017-07-14 DIAGNOSIS — H1033 Unspecified acute conjunctivitis, bilateral: Secondary | ICD-10-CM | POA: Diagnosis not present

## 2017-07-24 DIAGNOSIS — M6281 Muscle weakness (generalized): Secondary | ICD-10-CM | POA: Diagnosis not present

## 2017-07-24 DIAGNOSIS — G510 Bell's palsy: Secondary | ICD-10-CM | POA: Diagnosis not present

## 2017-08-01 DIAGNOSIS — H10403 Unspecified chronic conjunctivitis, bilateral: Secondary | ICD-10-CM | POA: Diagnosis not present

## 2017-08-07 DIAGNOSIS — G510 Bell's palsy: Secondary | ICD-10-CM | POA: Diagnosis not present

## 2017-08-07 DIAGNOSIS — M6281 Muscle weakness (generalized): Secondary | ICD-10-CM | POA: Diagnosis not present

## 2017-09-03 ENCOUNTER — Ambulatory Visit (INDEPENDENT_AMBULATORY_CARE_PROVIDER_SITE_OTHER): Payer: Medicare Other | Admitting: Neurology

## 2017-09-03 ENCOUNTER — Encounter: Payer: Self-pay | Admitting: Neurology

## 2017-09-03 VITALS — BP 140/75 | HR 88 | Ht 60.0 in | Wt 127.4 lb

## 2017-09-03 DIAGNOSIS — R471 Dysarthria and anarthria: Secondary | ICD-10-CM | POA: Diagnosis not present

## 2017-09-03 DIAGNOSIS — G510 Bell's palsy: Secondary | ICD-10-CM | POA: Diagnosis not present

## 2017-09-03 NOTE — Progress Notes (Signed)
[facility]    Provider:  Dr Lucia Gaskins Referring Provider: Rodrigo Ran, MD Primary Care Physician:  Rodrigo Ran, MD  CC:  Bells palsy  [name] [name] is still having difficulty. [name] [name] can close her eyes but [name] [name] is still taping it at night. [name] [name] is still having difficulty with facial weakness and speech. Physical therapy helped speech. [name] [name] has been doing the exercising per Pt [name] [name] still has some decreased blink on the right.  [name] [name] can whistle. [name] [name] can smile. [name] [name] has come a long way but her upper lip still having difficulty. Discussed facial reanimation surgery.   HPI:  [name] [name] is a 67 y.o. female here as a referral from Dr. Waynard Edwards for [name]. [name] [name] woke up with it in the morning. [name] [name] noticed right facial droop. [name] [name] had a slight headache for a few days and some right cervical muscle pain. [name] [name] is having difficulty blinking her right eye and it is getting dr and hurting and vision is blurry. No difference in taste. No hearing changes. Acute in onset. [name] [name] also reports some tingling.Worsening, speehc is slurred and numbness on the right side of the face. No weakness, no extremity changes. [name] [name] can't lift that eyebrow either. Difficult to chew, drooling. [name] [name] has a headache and the eye feels dry. [name] [name] is putting drops in the eyes frequently. [name] [name] sees Dr. Randon Goldsmith. Started yesterday morning. No previous illnesses or inciting events.No other focal neurologic deficits, associated symptoms, inciting events or modifiable factors. Patient does endorse sensory changes on the right side of the face. Patient endorses headache as well different than her migraines as well as blurry vision/worsening vision of right eye. [name] [name] denies any tick bites or being exposed any tics.  Reviewed notes, labs and imaging from outside physicians, which showed: MRI of the brain was performed without contrast which showed no acute intracranial abnormality with only mild chronic microvascular ischemic changes and age-appropriate  parenchymal volume loss.  Chem8 showed elevated glucose at 135, hemoglobin 11.9, hematocrit 35 otherwise unremarkable,  Review of Systems: Patient complains of symptoms per HPI as well as the following symptoms: Numbness, tingling, facial droop. Pertinent negatives and positives per HPI. All others negative.   Social History   Socioeconomic History  . Marital status: Married    Spouse name: Not on file  . Number of children: 3  . Years of education: Not on file  . Highest education level: Not on file  Social Needs  . Financial resource strain: Not on file  . Food insecurity - worry: Not on file  . Food insecurity - inability: Not on file  . Transportation needs - medical: Not on file  . Transportation needs - non-medical: Not on file  Occupational History  . Occupation: Chief Technology Officer: [employer]  Tobacco Use  . Smoking status: Former Smoker    Packs/day: 0.30    Types: Cigarettes    Last attempt to quit: 12/20/2014    Years since quitting: 2.7  . Smokeless tobacco: Never Used  . Tobacco comment: 2 packs per week  Substance and Sexual Activity  . Alcohol use: No  . Drug use: No  . Sexual activity: Yes    Partners: Male    Birth control/protection: Post-menopausal  Other Topics Concern  . Not on file  Social History Narrative  . Not on file    Family History  Problem Relation Age of Onset  . Hypertension Mother   . Hypertension Father   . Colon cancer  Neg Hx     Past Medical History:  Diagnosis Date  . Congestive heart failure (HCC)   . Fibromyalgia   . Fibromyalgia   . Hearing loss of both ears   . Hypertension   . Lung nodules   . Migraines    controlled with meds; down to once or twice yearly  . Osteoporosis     Past Surgical History:  Procedure Laterality Date  . BACK SURGERY  1986   ruptured disc  . BREAST SURGERY Right 1973   benign tumor  . fibromas removed    . HYSTEROSCOPY  11/98   with polypectomy  . TONSILLECTOMY       1960  . TUBAL LIGATION      Current Outpatient Medications  Medication Sig Dispense Refill  . acetaminophen (TYLENOL) 500 MG tablet Take 1,000 mg by mouth at bedtime.     Marland Kitchen amLODipine (NORVASC) 5 MG tablet Take 2.5 mg by mouth daily.    Marland Kitchen aspirin 81 MG chewable tablet Chew 81 mg by mouth at bedtime.     Marland Kitchen buPROPion (WELLBUTRIN SR) 150 MG 12 hr tablet Take 150 mg by mouth daily.     . calcium carbonate (OS-CAL) 600 MG TABS Take 600 mg by mouth daily.     . carvedilol (COREG) 12.5 MG tablet Take 1 tablet (12.5 mg total) by mouth 2 (two) times daily with a meal. 60 tablet 0  . Cinnamon 500 MG TABS Take 500 mg by mouth daily.    . Coenzyme Q10 (CO Q 10) 100 MG CAPS Take 100 mg by mouth 2 (two) times daily.     . cyclobenzaprine (FLEXERIL) 10 MG tablet Take 10 mg by mouth at bedtime.     . docusate sodium (COLACE) 100 MG capsule Take 100 mg by mouth daily.    . fish oil-omega-3 fatty acids 1000 MG capsule Take 1 g by mouth 2 (two) times daily at 10 AM and 5 PM.      . furosemide (LASIX) 20 MG tablet Take 0.5 tablets (10 mg total) by mouth daily. Keep holding for 3 days, then resume on 5/28 (Patient taking differently: Take 10 mg by mouth daily. ) 30 tablet   . gabapentin (NEURONTIN) 100 MG capsule Take 200 mg by mouth at bedtime.   11  . Hypromellose (ARTIFICIAL TEARS OP) Place 1 drop into both eyes daily as needed (dry eyes).    Marland Kitchen KLOR-CON M20 20 MEQ tablet Take 20 mEq by mouth every other day.     . Manganese Gluconate 50 MG TABS Take 50 mg by mouth at bedtime.     . Multiple Vitamin (MULTIVITAMIN) tablet Take 1 tablet by mouth daily.      . pravastatin (PRAVACHOL) 40 MG tablet Take 40 mg by mouth 4 (four) times a week.     . predniSONE (DELTASONE) 20 MG tablet Take 80mg (4 pill) for 7 days daily. Taper down as follows: Day8: 60mg , Day9: 40mg , Day10: 20mg : [id number]: 10mg  daily.Take with breakfast (Patient not taking: Reported on 03/21/2017) 35 tablet 0  . Riboflavin (VITAMIN B-2 PO) Take 1  tablet by mouth daily.    . SUMAtriptan (IMITREX) 50 MG tablet Take 25-50 mg by mouth once as needed for migraines  3  . TURMERIC PO Take 1 capsule by mouth daily.     . valACYclovir (VALTREX) 1000 MG tablet Take 1 tablet (1,000 mg total) by mouth 3 (three) times daily. 21 tablet 0  . Vitamin E (E 400  BLENDED PO) Take 400 mg by mouth 4 (four) times a week.     . zolpidem (AMBIEN) 10 MG tablet Take 10 mg by mouth at bedtime as needed for sleep.      No current facility-administered medications for this visit.     Allergies as of 09/03/2017 - Review Complete 03/04/2017  Allergen Reaction Noted  . Citrus Other (See Comments) 11/14/2016  . Silicon Swelling 03/03/2017  . Sulfa antibiotics Nausea And Vomiting 04/12/2011  . Escitalopram oxalate Other (See Comments)     Vitals: LMP 01/22/2001 (Approximate)  Last Weight:  Wt Readings from Last 1 Encounters:  03/04/17 128 lb 12.8 oz (58.4 kg)   Last Height:   Ht Readings from Last 1 Encounters:  03/04/17 4\' 11"  (1.499 m)    Physical exam: Exam: Gen: NAD, conversant, well nourised, well groomed                     CV: RRR, no MRG. No Carotid Bruits. No peripheral edema, warm, nontender Eyes: Conjunctivae clear without exudates or hemorrhage  Neuro: Detailed Neurologic Exam  Speech:    Speech is normal; fluent and spontaneous with normal comprehension.  Cognition:    The patient is oriented to person, place, and time;     recent and remote memory intact;     language fluent;     normal attention, concentration,     fund of knowledge Cranial Nerves:    The pupils are equal, round, and reactive to light. Attempted funduscopic exam could not visualize. Visual fields are full to finger confrontation. Extraocular movements are intact. Trigeminal sensation is impaired to pinprick on the right. Decreased blink right, impaired right eye closure(improved), Right-sided facial weakness including lower and upper:(improved can raise right  eyebrow now, [name] [name] could not  raise eyebrow on the right along with significant right lower facial drooping). The palate elevates in the midline. Hearing impaired. Voice is normal. Shoulder shrug is normal. The tongue has normal motion without fasciculations.   Coordination:    No dysmetria  Gait:    Normal native gait  Motor Observation:    No asymmetry, no atrophy, and no involuntary movements noted. Tone:    Normal muscle tone.    Posture:    Posture is normal. normal erect    Strength:    Strength is V/V in the upper and lower limbs.      Sensation: intact to LT     Reflex Exam:  DTR's:    Deep tendon reflexes in the upper and lower extremities are symmetrical bilaterally.   Toes:    The toes are downgoing bilaterally.   Clonus:    Clonus is absent.      Assessment/Plan:  This is a 67 year old female with acute onset right facial droop, exam shows decreased blink right, impaired right eye closure, Right-sided facial weakness including lower and upper: Cannot raise eyebrow on the right along with significant right lower facial drooping. Likely [name] however patient reports numbness to pinprick, paresthesias of the right lower face which is not consistent with just a bells palsy as it would not involve cranial nerve V need to order MRI of the brain with contrast (original MRI was without) to look for enhancement of cranial nerves or leptimeningeal enhancement that might shed light on etiology.   - Decreased blink right, impaired right eye closure(improved), Right-sided facial weakness including lower and upper:(improved can raise right eyebrow now) [name] [name] could not  raise eyebrow on the  right along with significant right lower facial drooping.  -MRI with contrast was negative for any other wtiology  - Completed high-dose steroids 80 mg with taper over 12 days and Valtrex 1 g 3 times a day.  - Improved but continues to have facial paralysis and dysarthria. Referral to  speech therapy. Physical therapy helped a lot.  - Discussed facial reanimation surgery at [facility], advised the sooner [name] [name] sees this specialty the better outcome if [name] [name] decides to go that route  Orders Placed This Encounter  Procedures  . Ambulatory referral to Speech Therapy  . Ambulatory referral to Plastic Surgery    Naomie Dean, MD  [facility] [address] [place], [place] [address]  Phone [phone] Fax [phone]  A total of 25 minutes was spent in with this patient. Over half this time was spent on counseling patient on the Bells Palsy diagnosis and different therapeutic options available.

## 2017-09-03 NOTE — Patient Instructions (Signed)
Bell Palsy, Adult Bell palsy is a short-term inability to move muscles in part of the face. The inability to move (paralysis) results from inflammation or compression of the facial nerve, which travels along the skull and under the ear to the side of the face (7th cranial nerve). This nerve is responsible for facial movements that include blinking, closing the eyes, smiling, and frowning. What are the causes? The exact cause of this condition is not known. It may be caused by an infection from a virus, such as the chickenpox (herpes zoster), Epstein-Barr, or mumps virus. What increases the risk? You are more likely to develop this condition if:  You are pregnant.  You have diabetes.  You have had a recent infection in your nose, throat, or airways (upper respiratory infection).  You have a weakened body defense system (immune system).  You have had a facial injury, such as a fracture.  You have a family history of Bell palsy.  What are the signs or symptoms? Symptoms of this condition include:  Weakness on one side of the face.  Drooping eyelid and corner of the mouth.  Excessive tearing in one eye.  Difficulty closing the eyelid.  Dry eye.  Drooling.  Dry mouth.  Changes in taste.  Change in facial appearance.  Pain behind one ear.  Ringing in one or both ears.  Sensitivity to sound in one ear.  Facial twitching.  Headache.  Impaired speech.  Dizziness.  Difficulty eating or drinking.  Most of the time, only one side of the face is affected. Rarely, Bell palsy affects the whole face. How is this diagnosed? This condition is diagnosed based on:  Your symptoms.  Your medical history.  A physical exam.  You may also have to see health care providers who specialize in disorders of the nerves (neurologist) or diseases and conditions of the eye (ophthalmologist). You may have tests, such as:  A test to check for nerve damage (electromyogram).  Imaging  studies, such as CT or MRI scans.  Blood tests.  How is this treated? This condition affects every person differently. Sometimes symptoms go away without treatment within a couple weeks. If treatment is needed, it varies from person to person. The goal of treatment is to reduce inflammation and protect the eye from damage. Treatment for Bell palsy may include:  Medicines, such as: ? Steroids to reduce swelling and inflammation. ? Antiviral drugs. ? Pain relievers, including aspirin, acetaminophen, or ibuprofen.  Eye drops or ointment to keep your eye moist.  Eye protection, if you cannot close your eye.  Exercises or massage to regain muscle strength and function (physical therapy).  Follow these instructions at home:  Take over-the-counter and prescription medicines only as told by your health care provider.  If your eye is affected: ? Keep your eye moist with eye drops or ointment as told by your health care provider. ? Follow instructions for eye care and protection as told by your health care provider.  Do any physical therapy exercises as told by your health care provider.  Keep all follow-up visits as told by your health care provider. This is important. Contact a health care provider if:  You have a fever.  Your symptoms do not get better within 2-3 weeks, or your symptoms get worse.  Your eye is red, irritated, or painful.  You have new symptoms. Get help right away if:  You have weakness or numbness in a part of your body other than your face.    You have trouble swallowing.  You develop neck pain or stiffness.  You develop dizziness or shortness of breath. Summary  Bell palsy is a short-term inability to move muscles in part of the face. The inability to move (paralysis) results from inflammation or compression of the facial nerve.  This condition affects every person differently. Sometimes symptoms go away without treatment within a couple weeks.  If  treatment is needed, it varies from person to person. The goal of treatment is to reduce inflammation and protect the eye from damage.  Contact your health care provider if your symptoms do not get better within 2-3 weeks, or your symptoms get worse. This information is not intended to replace advice given to you by your health care provider. Make sure you discuss any questions you have with your health care provider. Document Released: 06/10/2005 Document Revised: 08/13/2016 Document Reviewed: 08/13/2016 Elsevier Interactive Patient Education  2018 Elsevier Inc.  

## 2017-09-11 DIAGNOSIS — R55 Syncope and collapse: Secondary | ICD-10-CM | POA: Diagnosis not present

## 2017-09-11 DIAGNOSIS — R112 Nausea with vomiting, unspecified: Secondary | ICD-10-CM | POA: Diagnosis not present

## 2017-09-11 DIAGNOSIS — Z6822 Body mass index (BMI) 22.0-22.9, adult: Secondary | ICD-10-CM | POA: Diagnosis not present

## 2017-09-11 DIAGNOSIS — R1110 Vomiting, unspecified: Secondary | ICD-10-CM | POA: Diagnosis not present

## 2017-09-11 DIAGNOSIS — R197 Diarrhea, unspecified: Secondary | ICD-10-CM | POA: Diagnosis not present

## 2017-09-11 DIAGNOSIS — R1011 Right upper quadrant pain: Secondary | ICD-10-CM | POA: Diagnosis not present

## 2017-09-12 ENCOUNTER — Other Ambulatory Visit: Payer: Self-pay | Admitting: Internal Medicine

## 2017-09-12 DIAGNOSIS — R109 Unspecified abdominal pain: Secondary | ICD-10-CM

## 2017-09-12 DIAGNOSIS — R112 Nausea with vomiting, unspecified: Secondary | ICD-10-CM

## 2017-09-15 ENCOUNTER — Other Ambulatory Visit: Payer: Self-pay

## 2017-09-15 ENCOUNTER — Ambulatory Visit: Payer: Medicare Other | Attending: Neurology | Admitting: Speech Pathology

## 2017-09-15 ENCOUNTER — Encounter: Payer: Self-pay | Admitting: Speech Pathology

## 2017-09-15 ENCOUNTER — Ambulatory Visit
Admission: RE | Admit: 2017-09-15 | Discharge: 2017-09-15 | Disposition: A | Discharge status: Home / Self Care | Payer: Medicare Other | Source: Ambulatory Visit | Attending: Internal Medicine | Admitting: Internal Medicine

## 2017-09-15 DIAGNOSIS — R112 Nausea with vomiting, unspecified: Secondary | ICD-10-CM

## 2017-09-15 DIAGNOSIS — R471 Dysarthria and anarthria: Secondary | ICD-10-CM | POA: Insufficient documentation

## 2017-09-15 DIAGNOSIS — K824 Cholesterolosis of gallbladder: Secondary | ICD-10-CM | POA: Diagnosis not present

## 2017-09-15 DIAGNOSIS — R109 Unspecified abdominal pain: Secondary | ICD-10-CM

## 2017-09-15 NOTE — Patient Instructions (Signed)
Compensatory strategies for dysarthria   S   Slow L   Loud O  Overarticulate P   Pause  If you notice that you're getting fatigued after talking for long periods, consider taking some time for rest before phone conversations or social outings.

## 2017-09-16 NOTE — Therapy (Signed)
[facility] [address] [place], [place], [address] Phone: [phone]   Fax:  [phone]  Speech Language Pathology Evaluation  Patient Details  Name: [name] [name] MRN: [id number] Date of Birth: June 23, 1951 Referring Provider: Dr. Lucia Gaskins   Encounter Date: 09/15/2017  End of Session - 09/15/17 1316    Visit Number  1    Number of Visits  3    Date for SLP Re-Evaluation  10/17/17    SLP Start Time  1316    SLP Stop Time   1400    SLP Time Calculation (min)  44 min    Activity Tolerance  Patient tolerated treatment well       Past Medical History:  Diagnosis Date  . Congestive heart failure (HCC)   . Fibromyalgia   . Fibromyalgia   . Hearing loss of both ears   . Hypertension   . Lung nodules   . Migraines    controlled with meds; down to once or twice yearly  . Osteoporosis     Past Surgical History:  Procedure Laterality Date  . BACK SURGERY  1986   ruptured disc  . BREAST SURGERY Right 1973   benign tumor  . fibromas removed    . HYSTEROSCOPY  11/98   with polypectomy  . TONSILLECTOMY     1960  . TUBAL LIGATION      There were no vitals filed for this visit.  Subjective Assessment - 09/15/17 1321    Subjective  "[name]... it's been over 6 months."                              SLP Long Term Goals - 09/15/17 1316      SLP LONG TERM GOAL #1   Title  Patient will demonstrate compensations for dysarthria in 15 minutes simple conversation with modified independence.    Time  4    Period  Weeks    Status  New       Plan - 09/16/17 0846    Clinical Impression Statement  Patient presents with minimal dysarthria resulting from unilateral right facial weakness secondary to [name], characterized by imprecise labial consonants. There is not an impact on intelligibility in conversation, however pt reports feeling self-conscious about her "slurred speech" and is eager  to learn compensatory strategies to improve her speech. SLP initiated diagnostic treatment, training pt in compensatory techniques for dysarthria. Pt responsive to interventions and utilized techniques in word, sentence, and simple spontaneous responses with rare min A and improved articulatory precision. I recommend brief course of ST for further training and generalization of compensatory strategies for dysarthria; anticipate 1-2 additional sessions required.    Speech Therapy Frequency  -- 2 additional visits    Duration  4 weeks    Treatment/Interventions  SLP instruction and feedback;Patient/family education;Compensatory techniques    Potential to Achieve Goals  Good    SLP Home Exercise Plan  compensatory strategies for dysarthria provided    Consulted and Agree with Plan of Care  Patient       Patient will benefit from skilled therapeutic intervention in order to improve the following deficits and impairments:   Dysarthria and anarthria    Problem List Patient Active Problem List   Diagnosis Date Noted  . [name] 03/04/2017  . Syncope 11/14/2016  . Chronic diastolic CHF (congestive heart failure) (HCC) 11/14/2016  . Dyspnea 12/10/2012  .  Fibromyalgia   . Congestive heart failure (HCC)   . Hypertension   . Osteoporosis   . Essential hypertension, benign 03/09/2010  . Cardiomyopathy (HCC) 07/07/2009  . FATIGUE / MALAISE 07/07/2009   Rondel Baton, MS, CCC-SLP Speech-Language Pathologist  Arlana Lindau 09/16/2017, 8:54 AM  [facility] [address] [place], [place], [address] Phone: [phone]   Fax:  [phone]  Name: [name] [name] MRN: [id number] Date of Birth: [birth year]

## 2017-09-17 DIAGNOSIS — N390 Urinary tract infection, site not specified: Secondary | ICD-10-CM | POA: Diagnosis not present

## 2017-09-17 DIAGNOSIS — R300 Dysuria: Secondary | ICD-10-CM | POA: Diagnosis not present

## 2017-09-24 DIAGNOSIS — E785 Hyperlipidemia, unspecified: Secondary | ICD-10-CM | POA: Diagnosis not present

## 2017-09-24 DIAGNOSIS — I10 Essential (primary) hypertension: Secondary | ICD-10-CM | POA: Diagnosis not present

## 2017-09-24 DIAGNOSIS — R82998 Other abnormal findings in urine: Secondary | ICD-10-CM | POA: Diagnosis not present

## 2017-09-24 DIAGNOSIS — R7301 Impaired fasting glucose: Secondary | ICD-10-CM | POA: Diagnosis not present

## 2017-09-24 DIAGNOSIS — M810 Age-related osteoporosis without current pathological fracture: Secondary | ICD-10-CM | POA: Diagnosis not present

## 2017-09-29 DIAGNOSIS — G510 Bell's palsy: Secondary | ICD-10-CM | POA: Diagnosis not present

## 2017-10-01 DIAGNOSIS — I10 Essential (primary) hypertension: Secondary | ICD-10-CM | POA: Diagnosis not present

## 2017-10-01 DIAGNOSIS — E7849 Other hyperlipidemia: Secondary | ICD-10-CM | POA: Diagnosis not present

## 2017-10-01 DIAGNOSIS — N183 Chronic kidney disease, stage 3 (moderate): Secondary | ICD-10-CM | POA: Diagnosis not present

## 2017-10-01 DIAGNOSIS — Z1389 Encounter for screening for other disorder: Secondary | ICD-10-CM | POA: Diagnosis not present

## 2017-10-01 DIAGNOSIS — E038 Other specified hypothyroidism: Secondary | ICD-10-CM | POA: Diagnosis not present

## 2017-10-01 DIAGNOSIS — R112 Nausea with vomiting, unspecified: Secondary | ICD-10-CM | POA: Diagnosis not present

## 2017-10-01 DIAGNOSIS — R197 Diarrhea, unspecified: Secondary | ICD-10-CM | POA: Diagnosis not present

## 2017-10-01 DIAGNOSIS — Z0000 Encounter for general adult medical examination without abnormal findings: Secondary | ICD-10-CM | POA: Diagnosis not present

## 2017-10-01 DIAGNOSIS — R55 Syncope and collapse: Secondary | ICD-10-CM | POA: Diagnosis not present

## 2017-10-01 DIAGNOSIS — Z6824 Body mass index (BMI) 24.0-24.9, adult: Secondary | ICD-10-CM | POA: Diagnosis not present

## 2017-10-01 DIAGNOSIS — R109 Unspecified abdominal pain: Secondary | ICD-10-CM | POA: Diagnosis not present

## 2017-10-01 DIAGNOSIS — R030 Elevated blood-pressure reading, without diagnosis of hypertension: Secondary | ICD-10-CM | POA: Diagnosis not present

## 2017-10-03 DIAGNOSIS — G510 Bell's palsy: Secondary | ICD-10-CM | POA: Diagnosis not present

## 2017-10-08 ENCOUNTER — Ambulatory Visit: Payer: Medicare Other | Attending: Neurology | Admitting: Speech Pathology

## 2017-10-08 DIAGNOSIS — R471 Dysarthria and anarthria: Secondary | ICD-10-CM | POA: Insufficient documentation

## 2017-10-08 NOTE — Therapy (Signed)
[facility] [address] [place], [place], [address] Phone: [phone]   Fax:  [phone]  Speech Language Pathology Treatment and Discharge Summary Patient Details  Name: [name] [name] MRN: [id number] Date of Birth: Nov 03, 1950 Referring Provider: Dr. Jaynee Eagles   Encounter Date: 10/08/2017  End of Session - 10/08/17 1726    Visit Number  2    Number of Visits  3    Date for SLP Re-Evaluation  10/17/17    SLP Start Time  1618    SLP Stop Time   1700    SLP Time Calculation (min)  42 min    Activity Tolerance  Patient tolerated treatment well       Past Medical History:  Diagnosis Date  . Congestive heart failure ([place])   . Fibromyalgia   . Fibromyalgia   . Hearing loss of both ears   . Hypertension   . Lung nodules   . Migraines    controlled with meds; down to once or twice yearly  . Osteoporosis     Past Surgical History:  Procedure Laterality Date  . BACK SURGERY  1986   ruptured disc  . BREAST SURGERY Right 1973   benign tumor  . fibromas removed    . HYSTEROSCOPY  11/98   with polypectomy  . TONSILLECTOMY     1960  . TUBAL LIGATION      There were no vitals filed for this visit.  Subjective Assessment - 10/08/17 1736    Subjective  "People don't have trouble understanding me."    Currently in Pain?  No/denies            ADULT SLP TREATMENT - 10/08/17 1618      General Information   Behavior/Cognition  Alert;Cooperative;Pleasant mood    Patient Positioning  Upright in chair    Oral care provided  N/A      Treatment Provided   Treatment provided  Cognitive-Linquistic      Pain Assessment   Pain Assessment  No/denies pain      Cognitive-Linquistic Treatment   Treatment focused on  Dysarthria    Skilled Treatment  Pt reported speech has been more slurred today; continues to have fluctuations but feels overall speech is improving. Pt recalled "pausing" as a compensatory strategy; SLP  worked with pt using strategies for dysarthria, focusing on slow rate, pausing. Occasional min A required initially in structured tasks. As session progressed pt implemented strategies with modified independence in longer readings and 15 minutes simple conversation.       Assessment / Recommendations / Plan   Plan  Discharge SLP treatment due to (comment);All goals met      Progression Toward Goals   Progression toward goals  Goals met, education completed, patient discharged from [facility] - 10/08/17 1729    Education provided  Yes    Education Details  compensations for dysarthria    Person(s) Educated  Patient    Methods  Explanation;Demonstration    Comprehension  Verbalized understanding;Returned demonstration         SLP Long Term Goals - 10/08/17 1726      SLP LONG TERM GOAL #1   Title  Patient will demonstrate compensations for dysarthria in 15 minutes simple conversation with modified independence.    Time  4    Period  Weeks    Status  Achieved       Plan - 10/08/17 1726  Clinical Impression Statement  Patient presents with minimal dysarthria resulting from unilateral right facial weakness secondary to [name], characterized by imprecise labial consonants. Continued training in compensatory strategies for dysarthria today, which pt was implementing in simple conversation with modified independence by end of session. Pt reports she is pleased with current functional level; education completed and pt in agreement with d/c at this time.    Speech Therapy Frequency  -- d/c    Duration  -- d/c    Treatment/Interventions  SLP instruction and feedback;Patient/family education;Compensatory techniques    Potential to Achieve Goals  Good    SLP Home Exercise Plan  compensatory strategies for dysarthria provided    Consulted and Agree with Plan of Care  Patient       Patient will benefit from skilled therapeutic intervention in order to improve the  following deficits and impairments:   Dysarthria and anarthria    Problem List Patient Active Problem List   Diagnosis Date Noted  . [name] 03/04/2017  . Syncope 11/14/2016  . Chronic diastolic CHF (congestive heart failure) ([place]) 11/14/2016  . Dyspnea 12/10/2012  . Fibromyalgia   . Congestive heart failure ([place])   . Hypertension   . Osteoporosis   . Essential hypertension, benign 03/09/2010  . Cardiomyopathy ([place]) 07/07/2009  . FATIGUE / MALAISE 07/07/2009   SPEECH THERAPY DISCHARGE SUMMARY  Visits from Start of Care: 2  Current functional level related to goals / functional outcomes: Pt able to use compensations for dysarthria in 15 minutes simple conversation, modified independence.   Remaining deficits: Minimal dysarthria persists.   Education / Equipment: Compensatory strategies for dysarthria  Plan: Patient agrees to discharge.  Patient goals were met. Patient is being discharged due to meeting the stated rehab goals.  ?????         Deneise Lever, [place], CCC-SLP Speech-Language Pathologist  Aliene Altes 10/08/2017, 5:37 PM  [facility] [address] [place], [place], [address] Phone: [phone]   Fax:  [phone]   Name: [name] [name] MRN: [id number] Date of Birth: [birth year]

## 2017-12-12 DIAGNOSIS — H5203 Hypermetropia, bilateral: Secondary | ICD-10-CM | POA: Diagnosis not present

## 2017-12-12 DIAGNOSIS — H04123 Dry eye syndrome of bilateral lacrimal glands: Secondary | ICD-10-CM | POA: Diagnosis not present

## 2018-04-13 DIAGNOSIS — G510 Bell's palsy: Secondary | ICD-10-CM | POA: Diagnosis not present

## 2018-04-13 DIAGNOSIS — H04123 Dry eye syndrome of bilateral lacrimal glands: Secondary | ICD-10-CM | POA: Diagnosis not present

## 2018-05-04 DIAGNOSIS — Z6825 Body mass index (BMI) 25.0-25.9, adult: Secondary | ICD-10-CM | POA: Diagnosis not present

## 2018-05-04 DIAGNOSIS — J069 Acute upper respiratory infection, unspecified: Secondary | ICD-10-CM | POA: Diagnosis not present

## 2018-05-04 DIAGNOSIS — J029 Acute pharyngitis, unspecified: Secondary | ICD-10-CM | POA: Diagnosis not present

## 2018-07-13 DIAGNOSIS — J029 Acute pharyngitis, unspecified: Secondary | ICD-10-CM | POA: Diagnosis not present

## 2018-10-29 DIAGNOSIS — R7301 Impaired fasting glucose: Secondary | ICD-10-CM | POA: Diagnosis not present

## 2018-10-29 DIAGNOSIS — I10 Essential (primary) hypertension: Secondary | ICD-10-CM | POA: Diagnosis not present

## 2018-10-29 DIAGNOSIS — E7849 Other hyperlipidemia: Secondary | ICD-10-CM | POA: Diagnosis not present

## 2018-10-29 DIAGNOSIS — M810 Age-related osteoporosis without current pathological fracture: Secondary | ICD-10-CM | POA: Diagnosis not present

## 2018-11-05 DIAGNOSIS — Z8669 Personal history of other diseases of the nervous system and sense organs: Secondary | ICD-10-CM | POA: Diagnosis not present

## 2018-11-05 DIAGNOSIS — I509 Heart failure, unspecified: Secondary | ICD-10-CM | POA: Diagnosis not present

## 2018-11-05 DIAGNOSIS — H9190 Unspecified hearing loss, unspecified ear: Secondary | ICD-10-CM | POA: Diagnosis not present

## 2018-11-05 DIAGNOSIS — Z0000 Encounter for general adult medical examination without abnormal findings: Secondary | ICD-10-CM | POA: Diagnosis not present

## 2018-11-05 DIAGNOSIS — Z1331 Encounter for screening for depression: Secondary | ICD-10-CM | POA: Diagnosis not present

## 2018-11-05 DIAGNOSIS — I129 Hypertensive chronic kidney disease with stage 1 through stage 4 chronic kidney disease, or unspecified chronic kidney disease: Secondary | ICD-10-CM | POA: Diagnosis not present

## 2018-11-05 DIAGNOSIS — Z1339 Encounter for screening examination for other mental health and behavioral disorders: Secondary | ICD-10-CM | POA: Diagnosis not present

## 2018-11-05 DIAGNOSIS — R809 Proteinuria, unspecified: Secondary | ICD-10-CM | POA: Diagnosis not present

## 2018-11-05 DIAGNOSIS — M810 Age-related osteoporosis without current pathological fracture: Secondary | ICD-10-CM | POA: Diagnosis not present

## 2018-11-05 DIAGNOSIS — E785 Hyperlipidemia, unspecified: Secondary | ICD-10-CM | POA: Diagnosis not present

## 2018-11-05 DIAGNOSIS — N183 Chronic kidney disease, stage 3 (moderate): Secondary | ICD-10-CM | POA: Diagnosis not present

## 2018-11-05 DIAGNOSIS — R7301 Impaired fasting glucose: Secondary | ICD-10-CM | POA: Diagnosis not present

## 2018-12-16 DIAGNOSIS — H5203 Hypermetropia, bilateral: Secondary | ICD-10-CM | POA: Diagnosis not present

## 2018-12-16 DIAGNOSIS — H04123 Dry eye syndrome of bilateral lacrimal glands: Secondary | ICD-10-CM | POA: Diagnosis not present

## 2018-12-16 DIAGNOSIS — H26493 Other secondary cataract, bilateral: Secondary | ICD-10-CM | POA: Diagnosis not present

## 2018-12-16 DIAGNOSIS — Z961 Presence of intraocular lens: Secondary | ICD-10-CM | POA: Diagnosis not present

## 2019-02-17 ENCOUNTER — Other Ambulatory Visit: Payer: Self-pay

## 2019-02-17 ENCOUNTER — Other Ambulatory Visit: Payer: Self-pay | Admitting: Internal Medicine

## 2019-02-17 DIAGNOSIS — Z1231 Encounter for screening mammogram for malignant neoplasm of breast: Secondary | ICD-10-CM

## 2019-02-19 ENCOUNTER — Encounter: Payer: Self-pay | Admitting: Certified Nurse Midwife

## 2019-02-19 ENCOUNTER — Other Ambulatory Visit (HOSPITAL_COMMUNITY)
Admission: RE | Admit: 2019-02-19 | Discharge: 2019-02-19 | Disposition: A | Discharge status: Home / Self Care | Payer: Medicare Other | Source: Ambulatory Visit | Attending: Certified Nurse Midwife | Admitting: Certified Nurse Midwife

## 2019-02-19 ENCOUNTER — Other Ambulatory Visit: Payer: Self-pay

## 2019-02-19 ENCOUNTER — Ambulatory Visit (INDEPENDENT_AMBULATORY_CARE_PROVIDER_SITE_OTHER): Payer: Medicare Other | Admitting: Certified Nurse Midwife

## 2019-02-19 VITALS — BP 120/72 | HR 68 | Temp 97.2°F | Resp 16 | Ht 59.75 in | Wt 121.0 lb

## 2019-02-19 DIAGNOSIS — Z780 Asymptomatic menopausal state: Secondary | ICD-10-CM | POA: Insufficient documentation

## 2019-02-19 DIAGNOSIS — Z124 Encounter for screening for malignant neoplasm of cervix: Secondary | ICD-10-CM

## 2019-02-19 DIAGNOSIS — Z1151 Encounter for screening for human papillomavirus (HPV): Secondary | ICD-10-CM | POA: Insufficient documentation

## 2019-02-19 DIAGNOSIS — Z01419 Encounter for gynecological examination (general) (routine) without abnormal findings: Secondary | ICD-10-CM | POA: Diagnosis not present

## 2019-02-19 DIAGNOSIS — N951 Menopausal and female climacteric states: Secondary | ICD-10-CM | POA: Diagnosis not present

## 2019-02-19 NOTE — Progress Notes (Signed)
68 y.o. G42P3003 Widowed  Caucasian Fe here for annual exam.Post menopausal no HRT. Vaginal dryness using Vitamin E cream prn. Denies vaginal bleeding ever. Retired in 2016 with Spouse. They traveled cross country to bowl in [place]. Spouse passed away in 01/03/2019 due to stroke complications. Had Factor 5 Leiden, which is was unaware of this. Sons aware that they need to be tested. Grieving her loss still very much. Sees PCP Dr. Joylene Draft for aex, medication management of hypertension, Congestive heart failure, Osteoporosis, depression, labs. Has next appointment in 03/2019. No other health issues today.  Patient's last menstrual period was 01/22/2001 (approximate).          Sexually active: No.  The current method of family planning is tubal ligation.    Exercising: No.  exercise Smoker:  no  Review of Systems  Constitutional: Negative.   HENT: Negative.   Eyes: Negative.   Respiratory: Negative.   Cardiovascular: Negative.   Gastrointestinal: Negative.   Genitourinary: Negative.   Musculoskeletal: Negative.   Skin: Negative.   Neurological: Negative.   Endo/Heme/Allergies: Negative.   Psychiatric/Behavioral: Negative.     Health Maintenance: Pap:  03-18-14 neg HPV HR neg History of Abnormal Pap: unsure MMG:  06-27-17 category b density birads 1:neg Self Breast exams: yes Colonoscopy:  2012 f/u 53yrs BMD:   2016 with pcp TDaP:  2012 Shingles: 2013 Pneumonia: 2014 Hep C and HIV: not done Labs: if needed   reports that she quit smoking about 4 years ago. Her smoking use included cigarettes. She smoked 0.30 packs per day. She has never used smokeless tobacco. She reports that she does not drink alcohol or use drugs.  Past Medical History:  Diagnosis Date  . Congestive heart failure ([place])   . Fibromyalgia   . Fibromyalgia   . Hearing loss of both ears   . Hypertension   . Lung nodules   . Migraines    controlled with meds; down to once or twice yearly  . Osteoporosis     Past  Surgical History:  Procedure Laterality Date  . BACK SURGERY  1986   ruptured disc  . BREAST SURGERY Right 1973   benign tumor  . fibromas removed    . HYSTEROSCOPY  11/98   with polypectomy  . TONSILLECTOMY     1960  . TUBAL LIGATION      Current Outpatient Medications  Medication Sig Dispense Refill  . acetaminophen (TYLENOL) 500 MG tablet Take 1,000 mg by mouth at bedtime.     Marland Kitchen amLODipine (NORVASC) 5 MG tablet Take 2.5 mg by mouth daily.    Marland Kitchen aspirin 81 MG chewable tablet Chew 81 mg by mouth at bedtime.     Marland Kitchen buPROPion (WELLBUTRIN SR) 150 MG 12 hr tablet Take 150 mg by mouth daily.     . calcium carbonate (OS-CAL) 600 MG TABS Take 600 mg by mouth daily.     . carvedilol (COREG) 12.5 MG tablet Take 1 tablet (12.5 mg total) by mouth 2 (two) times daily with a meal. 60 tablet 0  . Cinnamon 500 MG TABS Take 500 mg by mouth daily.    . Coenzyme Q10 (CO Q 10) 100 MG CAPS Take 100 mg by mouth 2 (two) times daily.     . cyclobenzaprine (FLEXERIL) 10 MG tablet Take 10 mg by mouth at bedtime.     . docusate sodium (COLACE) 100 MG capsule Take 100 mg by mouth daily.    . fish oil-omega-3 fatty acids 1000  MG capsule Take 1 g by mouth 2 (two) times daily at 10 AM and 5 PM.      . furosemide (LASIX) 20 MG tablet Take 0.5 tablets (10 mg total) by mouth daily. Keep holding for 3 days, then resume on 5/28 (Patient taking differently: Take 10 mg by mouth daily. ) 30 tablet   . gabapentin (NEURONTIN) 100 MG capsule Take 200 mg by mouth at bedtime.   11  . Hypromellose (ARTIFICIAL TEARS OP) Place 1 drop into both eyes daily as needed (dry eyes).    . Multiple Vitamin (MULTIVITAMIN) tablet Take 1 tablet by mouth daily.      . Potassium Chloride ER 20 MEQ TBCR Take 1 tablet by mouth every other day.    . pravastatin (PRAVACHOL) 40 MG tablet Take 40 mg by mouth 4 (four) times a week.     . Riboflavin (VITAMIN B-2 PO) Take 1 tablet by mouth daily.    . SUMAtriptan (IMITREX) 50 MG tablet Take 25-50 mg  by mouth once as needed for migraines  3  . TURMERIC PO Take 1 capsule by mouth daily.     . Vitamin E (E 400 BLENDED PO) Take 400 mg by mouth 4 (four) times a week.     . zolpidem (AMBIEN) 10 MG tablet Take 10 mg by mouth at bedtime as needed for sleep.     Marland Kitchen KLOR-CON M20 20 MEQ tablet Take 20 mEq by mouth every other day.      No current facility-administered medications for this visit.     Family History  Problem Relation Age of Onset  . Hypertension Mother   . Hypertension Father   . Colon cancer Neg Hx     ROS:  Pertinent items are noted in HPI.  Otherwise, a comprehensive ROS was negative.  Exam:   BP 120/72   Pulse 68   Temp (!) 97.2 F (36.2 C) (Skin)   Resp 16   Ht 4' 11.75" (1.518 m)   Wt 121 lb (54.9 kg)   LMP 01/22/2001 (Approximate)   BMI 23.83 kg/m  Height: 4' 11.75" (151.8 cm) Ht Readings from Last 3 Encounters:  02/19/19 4' 11.75" (1.518 m)  09/03/17 5' (1.524 m)  03/04/17 4\' 11"  (1.499 m)    General appearance: alert, cooperative and appears stated age Head: Normocephalic, without obvious abnormality, atraumatic Neck: no adenopathy, supple, symmetrical, trachea midline and thyroid normal to inspection and palpation Lungs: clear to auscultation bilaterally Breasts: normal appearance, no masses or tenderness, No nipple retraction or dimpling, No nipple discharge or bleeding, No axillary or supraclavicular adenopathy Heart: regular rate and rhythm Abdomen: soft, non-tender; no masses,  no organomegaly Extremities: extremities normal, atraumatic, no cyanosis or edema Skin: Skin color, texture, turgor normal. No rashes or lesions Lymph nodes: Cervical, supraclavicular, and axillary nodes normal. No abnormal inguinal nodes palpated Neurologic: Grossly normal   Pelvic: External genitalia:  no lesions              Urethra:  normal appearing urethra with no masses, tenderness or lesions              Bartholin's and Skene's: normal                 Vagina:  normal appearing vagina with normal color and discharge, no lesions              Cervix: multiparous appearance, no cervical motion tenderness and no lesions  Pap taken: Yes.   Bimanual Exam:  Uterus:  normal size, contour, position, consistency, mobility, non-tender              Adnexa: normal adnexa and no mass, fullness, tenderness               Rectovaginal: Confirms               Anus:  normal sphincter tone, no lesions  Chaperone present: yes  A:  Well Woman with normal exam  Post menopausal no HRT  Vaginal dryness  Hypertension, cholesterol, CHF,depression/osteoporosis management   Mammogram scheduled  Social stress with death of spouse 2 months ago  P:   Reviewed health and wellness pertinent to exam  Aware of need to advise if vaginal bleeding  Discussed using coconut oil for dryness to see if this works better. Instructions given.  Continue follow up with PCP as indicated.  Discussed Hospice Grief counseling if needed and seek friends/family support as needed.  Pap smear: yes   counseled on breast self exam, mammography screening, feminine hygiene, adequate intake of calcium and vitamin D, diet and exercise  return annually or prn  An After Visit Summary was printed and given to the patient.

## 2019-02-19 NOTE — Patient Instructions (Signed)

## 2019-02-25 LAB — CYTOLOGY - PAP
Diagnosis: NEGATIVE
HPV: NOT DETECTED

## 2019-04-02 ENCOUNTER — Other Ambulatory Visit: Payer: Self-pay

## 2019-04-02 ENCOUNTER — Ambulatory Visit
Admission: RE | Admit: 2019-04-02 | Discharge: 2019-04-02 | Disposition: A | Discharge status: Home / Self Care | Payer: Medicare Other | Source: Ambulatory Visit | Attending: Internal Medicine | Admitting: Internal Medicine

## 2019-04-02 DIAGNOSIS — Z1231 Encounter for screening mammogram for malignant neoplasm of breast: Secondary | ICD-10-CM | POA: Diagnosis not present

## 2019-04-05 DIAGNOSIS — H26493 Other secondary cataract, bilateral: Secondary | ICD-10-CM | POA: Diagnosis not present

## 2019-04-05 DIAGNOSIS — Z961 Presence of intraocular lens: Secondary | ICD-10-CM | POA: Diagnosis not present

## 2019-04-09 IMAGING — MR MR HEAD W/O CM
9 of 10 series · 37 of 48 positions shown · non-contrast
Comparison: 03/03/2017 CT head

CLINICAL DATA: 66 y/o  F; right facial droop.

EXAM:
MRI HEAD WITHOUT CONTRAST
TECHNIQUE: Multiplanar, multiecho pulse sequences of the brain and surrounding
structures were obtained without intravenous contrast.

[Series 3: DWI · axial · 3.0mm · 1.09mm/px · z∈[-102,+29]mm · 9 of 94 slices shown (1 of 4)]
[im 1/94]
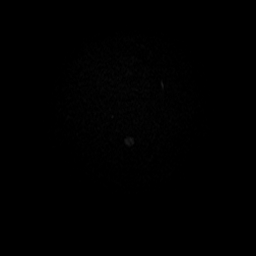
[im 12/94]
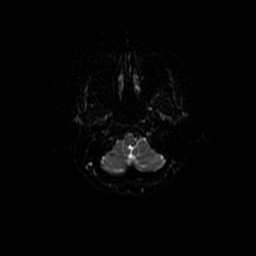
[im 24/94]
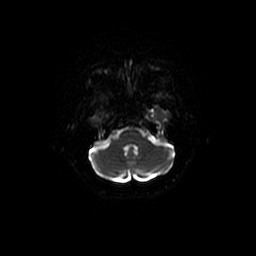
[im 35/94]
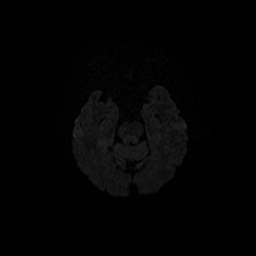
[im 47/94]
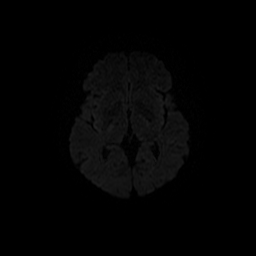
[im 59/94]
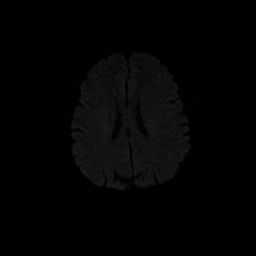
[im 70/94]
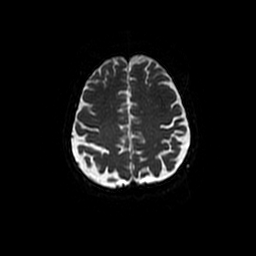
[im 82/94]
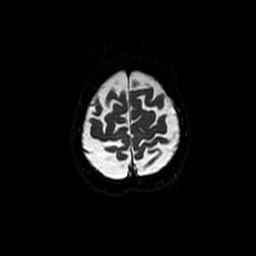
[im 94/94]
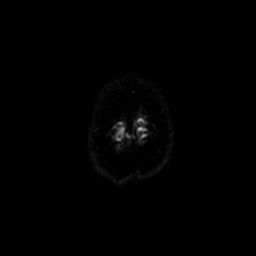

[Series 4: DWI · coronal · 5.0mm · 1.09mm/px · 8 of 76 slices shown (2 of 4)]
[im 1/76]
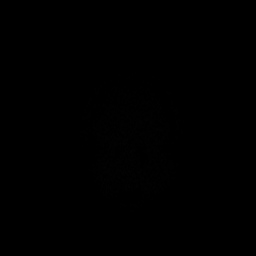
[im 11/76]
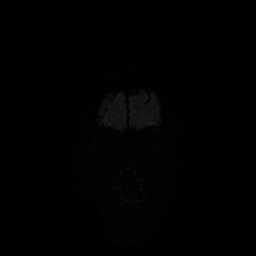
[im 22/76]
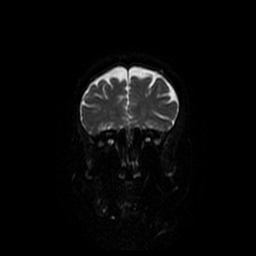
[im 33/76]
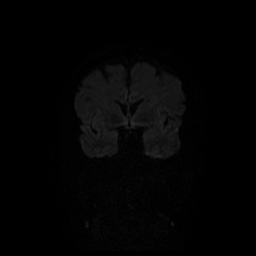
[im 43/76]
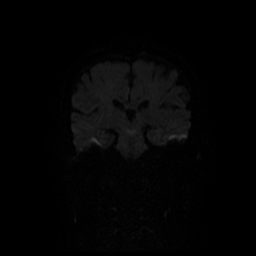
[im 54/76]
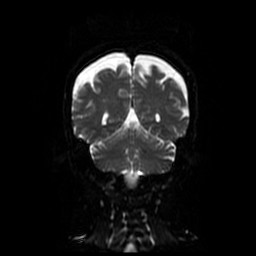
[im 65/76]
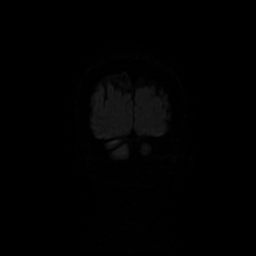
[im 76/76]
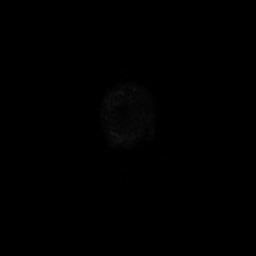

[Series 5: T1 · sagittal · 5.0mm · 0.47mm/px · 2 of 23 slices shown]
[im 1/23]
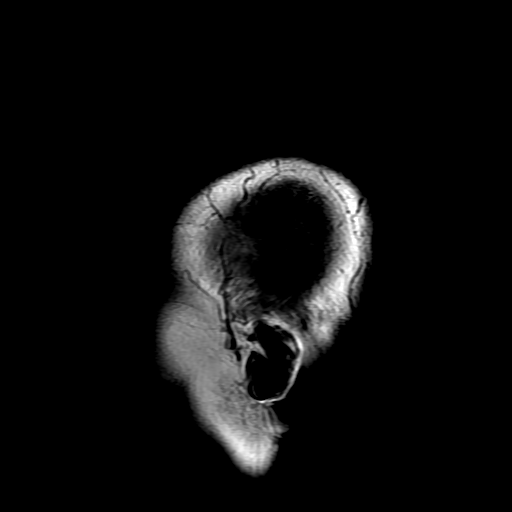
[im 23/23]
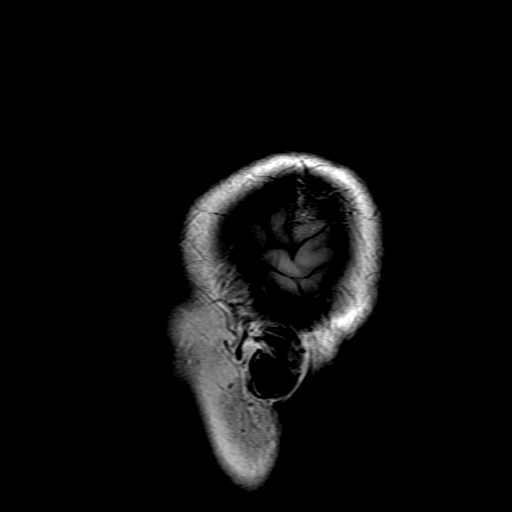

[Series 6: T2 · axial · 5.0mm · 0.47mm/px · z∈[-102,+36]mm · 3 of 25 slices shown (1 of 2)]
[im 1/25]
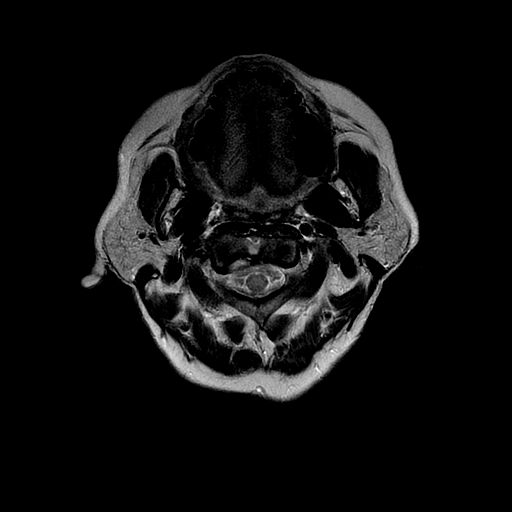
[im 13/25]
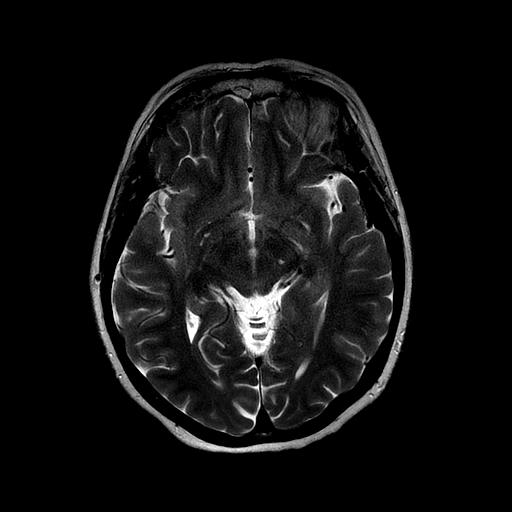
[im 25/25]
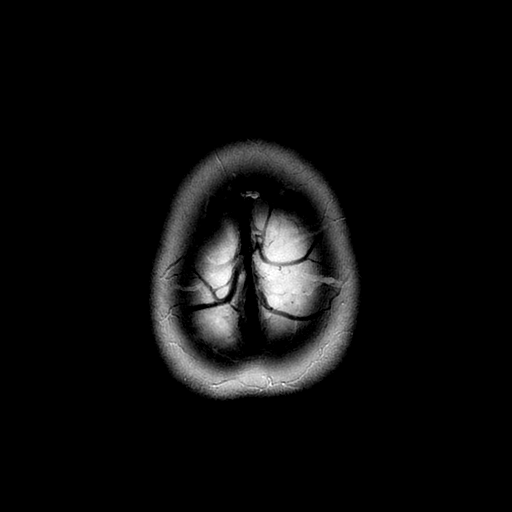

[Series 7: FLAIR · axial · 5.0mm · 0.47mm/px · z∈[-102,+36]mm · 3 of 25 slices shown]
[im 1/25]
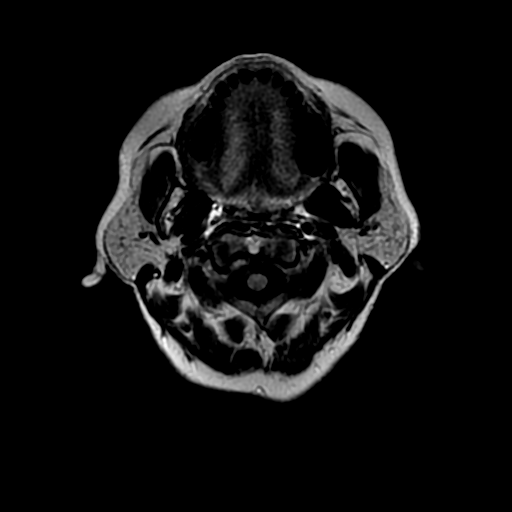
[im 13/25]
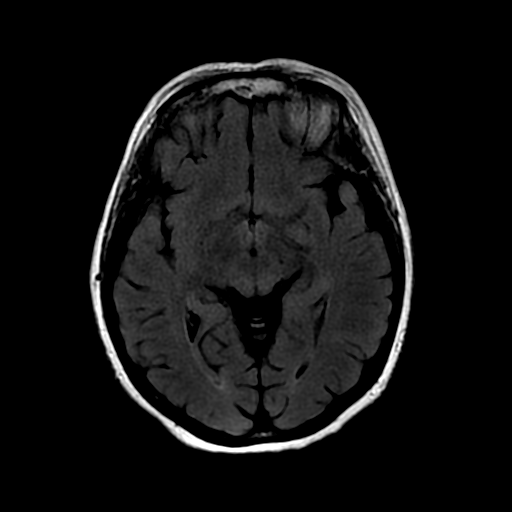
[im 25/25]
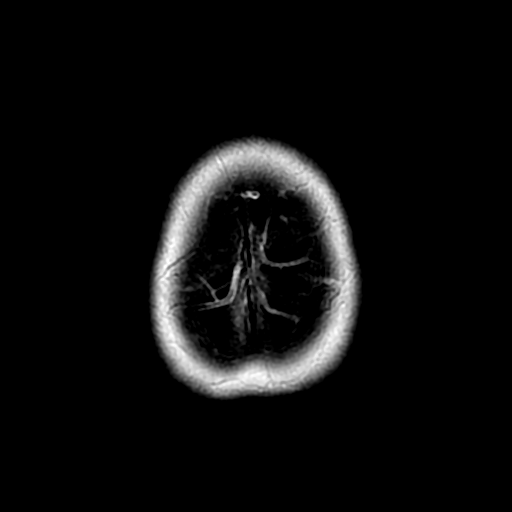

[Series 8: ax mpgr · axial · 5.0mm · 0.47mm/px · 1 of 21 slices shown]
[im 1/21]
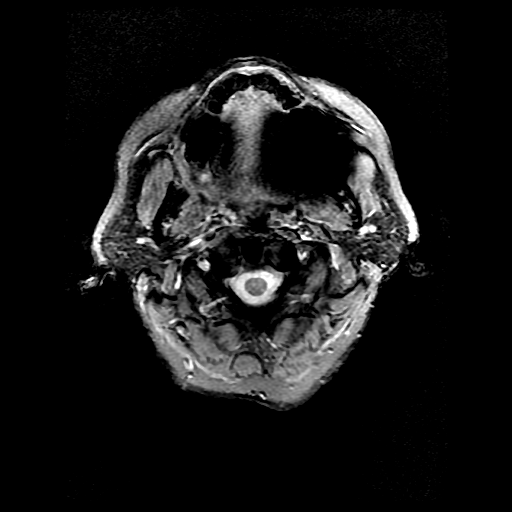

[Series 10: T2 · coronal · 5.0mm · 0.43mm/px · 2 of 24 slices shown (2 of 2)]
[im 1/24]
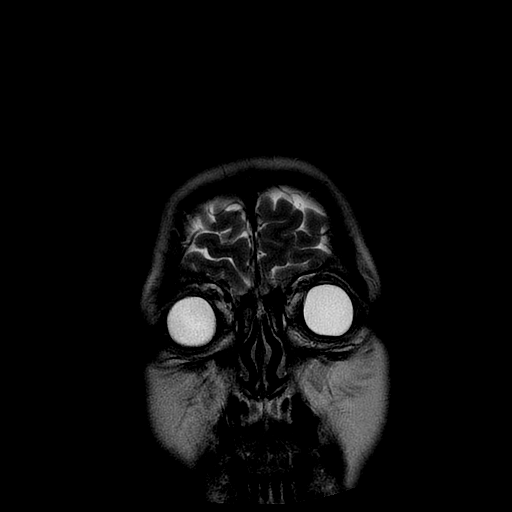
[im 24/24]
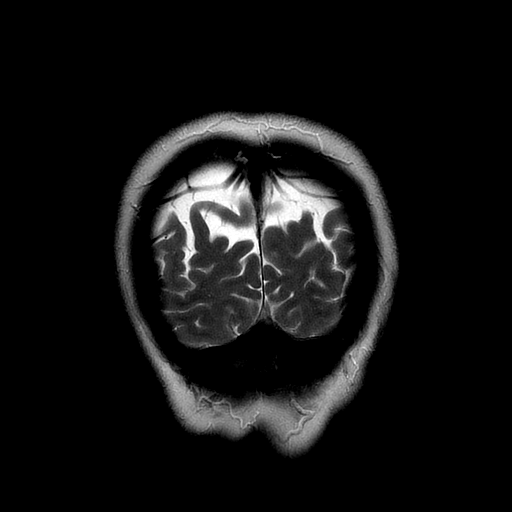

[Series 300: DWI · axial · 3.0mm · 1.09mm/px · z∈[-102,+29]mm · 5 of 47 slices shown (3 of 4)]
[im 1/47]
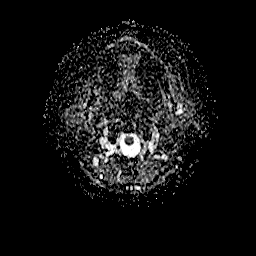
[im 12/47]
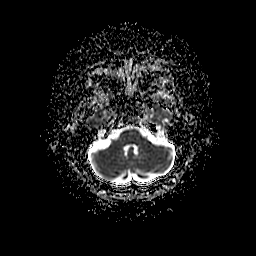
[im 24/47]
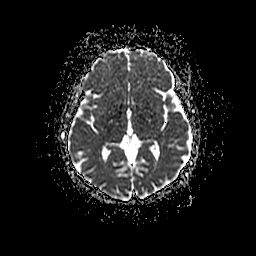
[im 35/47]
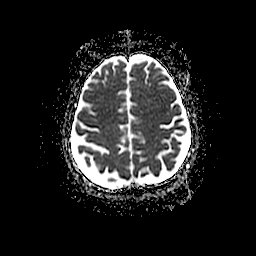
[im 47/47]
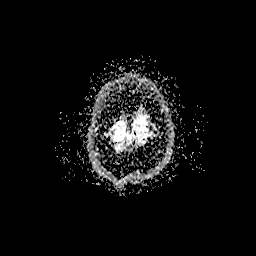

[Series 400: DWI · coronal · 5.0mm · 1.09mm/px · 4 of 38 slices shown (4 of 4)]
[im 1/38]
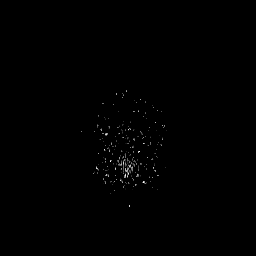
[im 13/38]
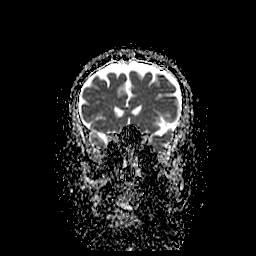
[im 25/38]
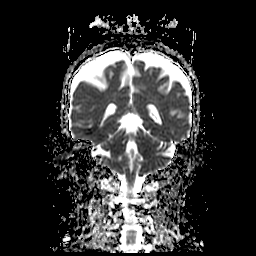
[im 38/38]
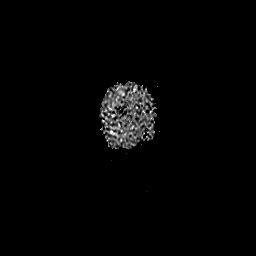

[37 of 48 positions shown; findings below may reference images not displayed]

FINDINGS: Brain: No acute infarction, hemorrhage, hydrocephalus, extra-axial
collection or mass lesion. Few nonspecific foci of T2 FLAIR
hyperintense signal abnormality in subcortical and periventricular
white matter is compatible with mild chronic microvascular ischemic
changes for age. Mild brain parenchymal volume loss.

Vascular: Normal flow voids.

Skull and upper cervical spine: Normal marrow signal.

Sinuses/Orbits: Negative.

Other: None.
IMPRESSION: 1. No acute intracranial abnormality.
2. Mild chronic microvascular ischemic changes and mild parenchymal
volume loss of the brain.

By: Keita Touma M.D.

## 2019-04-20 DIAGNOSIS — H26491 Other secondary cataract, right eye: Secondary | ICD-10-CM | POA: Diagnosis not present

## 2019-05-05 DIAGNOSIS — N183 Chronic kidney disease, stage 3 unspecified: Secondary | ICD-10-CM | POA: Diagnosis not present

## 2019-05-05 DIAGNOSIS — M810 Age-related osteoporosis without current pathological fracture: Secondary | ICD-10-CM | POA: Diagnosis not present

## 2019-05-05 DIAGNOSIS — K219 Gastro-esophageal reflux disease without esophagitis: Secondary | ICD-10-CM | POA: Diagnosis not present

## 2019-05-05 DIAGNOSIS — R7301 Impaired fasting glucose: Secondary | ICD-10-CM | POA: Diagnosis not present

## 2019-05-05 DIAGNOSIS — Z8669 Personal history of other diseases of the nervous system and sense organs: Secondary | ICD-10-CM | POA: Diagnosis not present

## 2019-05-05 DIAGNOSIS — E785 Hyperlipidemia, unspecified: Secondary | ICD-10-CM | POA: Diagnosis not present

## 2019-05-05 DIAGNOSIS — R82998 Other abnormal findings in urine: Secondary | ICD-10-CM | POA: Diagnosis not present

## 2019-05-05 DIAGNOSIS — M797 Fibromyalgia: Secondary | ICD-10-CM | POA: Diagnosis not present

## 2019-05-05 DIAGNOSIS — I10 Essential (primary) hypertension: Secondary | ICD-10-CM | POA: Diagnosis not present

## 2019-05-05 DIAGNOSIS — R918 Other nonspecific abnormal finding of lung field: Secondary | ICD-10-CM | POA: Diagnosis not present

## 2019-05-05 DIAGNOSIS — Z720 Tobacco use: Secondary | ICD-10-CM | POA: Diagnosis not present

## 2019-05-05 DIAGNOSIS — I509 Heart failure, unspecified: Secondary | ICD-10-CM | POA: Diagnosis not present

## 2019-05-06 DIAGNOSIS — M810 Age-related osteoporosis without current pathological fracture: Secondary | ICD-10-CM | POA: Diagnosis not present

## 2019-08-04 DIAGNOSIS — I10 Essential (primary) hypertension: Secondary | ICD-10-CM | POA: Diagnosis not present

## 2019-08-04 DIAGNOSIS — F329 Major depressive disorder, single episode, unspecified: Secondary | ICD-10-CM | POA: Diagnosis not present

## 2019-08-04 DIAGNOSIS — R55 Syncope and collapse: Secondary | ICD-10-CM | POA: Diagnosis not present

## 2019-09-15 ENCOUNTER — Encounter: Payer: Self-pay | Admitting: Certified Nurse Midwife

## 2019-11-25 DIAGNOSIS — I10 Essential (primary) hypertension: Secondary | ICD-10-CM | POA: Diagnosis not present

## 2019-11-25 DIAGNOSIS — M810 Age-related osteoporosis without current pathological fracture: Secondary | ICD-10-CM | POA: Diagnosis not present

## 2019-11-25 DIAGNOSIS — E7849 Other hyperlipidemia: Secondary | ICD-10-CM | POA: Diagnosis not present

## 2019-11-26 DIAGNOSIS — F329 Major depressive disorder, single episode, unspecified: Secondary | ICD-10-CM | POA: Diagnosis not present

## 2019-11-26 DIAGNOSIS — I509 Heart failure, unspecified: Secondary | ICD-10-CM | POA: Diagnosis not present

## 2019-11-26 DIAGNOSIS — E7849 Other hyperlipidemia: Secondary | ICD-10-CM | POA: Diagnosis not present

## 2019-11-26 DIAGNOSIS — M810 Age-related osteoporosis without current pathological fracture: Secondary | ICD-10-CM | POA: Diagnosis not present

## 2019-11-26 DIAGNOSIS — R197 Diarrhea, unspecified: Secondary | ICD-10-CM | POA: Diagnosis not present

## 2019-11-26 DIAGNOSIS — R109 Unspecified abdominal pain: Secondary | ICD-10-CM | POA: Diagnosis not present

## 2019-11-26 DIAGNOSIS — Z0000 Encounter for general adult medical examination without abnormal findings: Secondary | ICD-10-CM | POA: Diagnosis not present

## 2019-11-26 DIAGNOSIS — Z1389 Encounter for screening for other disorder: Secondary | ICD-10-CM | POA: Diagnosis not present

## 2019-11-26 DIAGNOSIS — Z1331 Encounter for screening for depression: Secondary | ICD-10-CM | POA: Diagnosis not present

## 2019-11-26 DIAGNOSIS — R300 Dysuria: Secondary | ICD-10-CM | POA: Diagnosis not present

## 2019-11-26 DIAGNOSIS — R82998 Other abnormal findings in urine: Secondary | ICD-10-CM | POA: Diagnosis not present

## 2019-11-26 DIAGNOSIS — I10 Essential (primary) hypertension: Secondary | ICD-10-CM | POA: Diagnosis not present

## 2020-05-24 DIAGNOSIS — H9190 Unspecified hearing loss, unspecified ear: Secondary | ICD-10-CM | POA: Diagnosis not present

## 2020-05-24 DIAGNOSIS — K219 Gastro-esophageal reflux disease without esophagitis: Secondary | ICD-10-CM | POA: Diagnosis not present

## 2020-05-24 DIAGNOSIS — M810 Age-related osteoporosis without current pathological fracture: Secondary | ICD-10-CM | POA: Diagnosis not present

## 2020-05-24 DIAGNOSIS — Z720 Tobacco use: Secondary | ICD-10-CM | POA: Diagnosis not present

## 2020-05-24 DIAGNOSIS — R7301 Impaired fasting glucose: Secondary | ICD-10-CM | POA: Diagnosis not present

## 2020-05-24 DIAGNOSIS — E785 Hyperlipidemia, unspecified: Secondary | ICD-10-CM | POA: Diagnosis not present

## 2020-05-24 DIAGNOSIS — F329 Major depressive disorder, single episode, unspecified: Secondary | ICD-10-CM | POA: Diagnosis not present

## 2020-05-24 DIAGNOSIS — I10 Essential (primary) hypertension: Secondary | ICD-10-CM | POA: Diagnosis not present

## 2020-07-07 DIAGNOSIS — H04123 Dry eye syndrome of bilateral lacrimal glands: Secondary | ICD-10-CM | POA: Diagnosis not present

## 2020-07-07 DIAGNOSIS — H02831 Dermatochalasis of right upper eyelid: Secondary | ICD-10-CM | POA: Diagnosis not present

## 2020-07-07 DIAGNOSIS — Z961 Presence of intraocular lens: Secondary | ICD-10-CM | POA: Diagnosis not present

## 2020-07-07 DIAGNOSIS — H5203 Hypermetropia, bilateral: Secondary | ICD-10-CM | POA: Diagnosis not present

## 2020-08-01 ENCOUNTER — Other Ambulatory Visit (HOSPITAL_COMMUNITY): Payer: Self-pay | Admitting: Internal Medicine

## 2020-08-01 MED FILL — MOLNUPIRAVIR 200 MG CAPS: 200 | 5 days supply | Qty: 40 | Fill #0

## 2020-09-12 NOTE — Progress Notes (Signed)
[facility] CONSULT NOTE  Referring Physician: Perini Primary Care:Perini, Loraine Leriche, MD Primary Cardiologist: None  HPI:  [name] [name] is a 70 year old former smoker (quit 2016) with a history of hypertension, fibromyalgia, hearing loss, NICM previous ejection fraction of 25-35% 2006 which recovered -per echocardiogram in July 2009, shows an EF of 50-55%.   We have not seen her since 2018.   LHC 2006 -Severe nonischemic cardiomyopathy of uncertain etiology.  No coronary disease. Stress ECHO 2014 Normal   Echo in 8/17 EF 55% with grade I DD  Referred back by Dr. Waynard Edwards for cardiac check up. Her husband died in 01/06/2023 from lung CA and she has been sad and lonely. Had mild COVID in 2/22 (unvaccinated) She continues to bowl on Tues and Thursday. (average ~ 140). Denies SOB, CP, orthopnea, PND or edema. BP well controlled. BP has been up and down. Says when she gets sicks she often gets presyncopal when her BP drops due to n/v.    Review of Systems: [y] = yes, [ ]  = no   General: Weight gain [ ] ; Weight loss [ ] ; Anorexia [ ] ; Fatigue [ ] ; Fever [ ] ; Chills [ ] ; Weakness [ ]   Cardiac: Chest pain/pressure [ ] ; Resting SOB [ ] ; Exertional SOB [ ] ; Orthopnea [ ] ; Pedal Edema [ ] ; Palpitations [ ] ; Syncope [ ] ; Presyncope [ ] ; Paroxysmal nocturnal dyspnea[ ]   Pulmonary: Cough [ ] ; Wheezing[ ] ; Hemoptysis[ ] ; Sputum [ ] ; Snoring [ ]   GI: Vomiting[ ] ; Dysphagia[ ] ; Melena[ ] ; Hematochezia [ ] ; Heartburn[ ] ; Abdominal pain [ ] ; Constipation [ ] ; Diarrhea [ ] ; BRBPR [ ]   GU: Hematuria[ ] ; Dysuria [ ] ; Nocturia[ ]   Vascular: Pain in legs with walking [ ] ; Pain in feet with lying flat [ ] ; Non-healing sores [ ] ; Stroke [ ] ; TIA [ ] ; Slurred speech [ ] ;  Neuro: Headaches[ ] ; Vertigo[ ] ; Seizures[ ] ; Paresthesias[ ] ;Blurred vision [ ] ; Diplopia [ ] ; Vision changes [ ]   Ortho/Skin: Arthritis [ y]; Joint pain [ y]; Muscle pain [ ] ; Joint swelling [ ] ; Back Pain [ ] ; Rash [ ]   Psych: Depression[ y];  Anxiety[ ]   Heme: Bleeding problems [ ] ; Clotting disorders [ ] ; Anemia [ ]   Endocrine: Diabetes [ ] ; Thyroid dysfunction[ ]    Past Medical History:  Diagnosis Date  . Congestive heart failure ([facility])   . Fibromyalgia   . Fibromyalgia   . Hearing loss of both ears   . Hypertension   . Lung nodules   . Migraines    controlled with meds; down to once or twice yearly  . Osteoporosis     Current Outpatient Medications  Medication Sig Dispense Refill  . acetaminophen (TYLENOL) 500 MG tablet Take 1,000 mg by mouth at bedtime.    amLODipine (NORVASC) 5 MG tablet Take 2.5 mg by mouth daily.    aspirin 81 MG chewable tablet Chew 81 mg by mouth at bedtime.    buPROPion (WELLBUTRIN SR) 150 MG 12 hr tablet Take 150 mg by mouth daily.     . calcium carbonate (OS-CAL) 600 MG TABS Take 600 mg by mouth daily.    . carvedilol (COREG) 12.5 MG tablet Take 1 tablet (12.5 mg total) by mouth 2 (two) times daily with a meal. 60 tablet 0  . Cinnamon 500 MG TABS Take 500 mg by mouth daily.    . Coenzyme Q10 (CO Q 10) 100 MG CAPS Take 100 mg by mouth 2 (  two) times daily.    . cyclobenzaprine (FLEXERIL) 10 MG tablet Take 10 mg by mouth at bedtime.    . docusate sodium (COLACE) 100 MG capsule Take 100 mg by mouth daily.    . fish oil-omega-3 fatty acids 1000 MG capsule Take 1 g by mouth 2 (two) times daily at 10 AM and 5 PM.    . furosemide (LASIX) 20 MG tablet Take 20 mg by mouth daily.    Marland Kitchen gabapentin (NEURONTIN) 100 MG capsule Take 200 mg by mouth at bedtime.   11  . Hypromellose (ARTIFICIAL TEARS OP) Place 1 drop into both eyes daily as needed (dry eyes).    Marland Kitchen KLOR-CON M20 20 MEQ tablet Take 20 mEq by mouth every other day.     . Multiple Vitamin (MULTIVITAMIN) tablet Take 1 tablet by mouth daily.    . Potassium Chloride ER 20 MEQ TBCR Take 1 tablet by mouth every other day.    . pravastatin (PRAVACHOL) 20 MG tablet Take 20 mg by mouth 3 (three) times a week. M W F    . QUERCETIN PO Take 1 tablet  by mouth daily.    . Riboflavin (VITAMIN B-2 PO) Take 1 tablet by mouth daily.    . SUMAtriptan (IMITREX) 50 MG tablet Take 25-50 mg by mouth once as needed for migraines  3  . TURMERIC PO Take 1 capsule by mouth daily.     . Vitamin E (E 400 BLENDED PO) Take 400 mg by mouth 4 (four) times a week.    . zinc gluconate 50 MG tablet Take 25 mg by mouth daily.    Marland Kitchen zolpidem (AMBIEN) 10 MG tablet Take 10 mg by mouth at bedtime as needed for sleep.      No current facility-administered medications for this encounter.    Allergies  Allergen Reactions  . Citrus Other (See Comments)    MIGRAINES (no citric acid, either)  . Lactose Intolerance (Gi)   . Silicon Swelling  . Sulfa Antibiotics Nausea And Vomiting  . Escitalopram Oxalate Other (See Comments)    Flu-like symptoms without fever      Social History   Socioeconomic History  . Marital status: Widowed    Spouse name: Not on file  . Number of children: 3  . Years of education: Not on file  . Highest education level: High school graduate  Occupational History  . Occupation: Chief Technology Officer: [employer]  Tobacco Use  . Smoking status: Former Smoker    Packs/day: 0.30    Types: Cigarettes    Quit date: 12/20/2014    Years since quitting: 5.7  . Smokeless tobacco: Never Used  . Tobacco comment: 2 packs per week  Vaping Use  . Vaping Use: Never used  Substance and Sexual Activity  . Alcohol use: No  . Drug use: No  . Sexual activity: Not Currently    Partners: Male    Birth control/protection: Post-menopausal  Other Topics Concern  . Not on file  Social History Narrative   Lives at home with her husband   Left handed   Drinks about 2 cups of caffeine daily   Social Determinants of Health   Financial Resource Strain: Not on file  Food Insecurity: Not on file  Transportation Needs: Not on file  Physical Activity: Not on file  Stress: Not on file  Social Connections: Not on file  Intimate Partner  Violence: Not on file  Family History  Problem Relation Age of Onset  . Hypertension Mother   . Hypertension Father   . Colon cancer Neg Hx     Vitals:   09/13/20 1111  BP: 130/70  Pulse: 86  SpO2: 96%  Weight: 55 kg (121 lb 3.2 oz)    PHYSICAL EXAM: General:  Well appearing. No respiratory difficulty HEENT: normal Neck: supple. no JVD. Carotids 2+ bilat; no bruits. No lymphadenopathy or thryomegaly appreciated. Cor: PMI nondisplaced. Regular rate & rhythm. No rubs, gallops or murmurs. Lungs: clear Abdomen: soft, nontender, nondistended. No hepatosplenomegaly. No bruits or masses. Good bowel sounds. Extremities: no cyanosis, clubbing, rash, edema Neuro: alert & oriented x 3, cranial nerves grossly intact. moves all 4 extremities w/o difficulty. Affect pleasant.  ECG: NSR 80 non-specific ST-T no change from previous Personally reviewed   ASSESSMENT & PLAN:  1. Chronic Diastolic Heart Failure- Previous NICM .  - LHC 2006 with normal cors. EF recovered in 2014 ~55%.  Last echo 8/17 EF 55% - Doing well. Volume status looks ok  - Will get f/u echo to ensure stability of EF  2. HTN - Blood pressure well controlled. Continue current regimen.  3. Former Smoker - quit 5 years ago   Will get repeat echo. If EF remains normal will have her graduate [facility] Clinic and have yearly f/u with general Cardiology. Will have her see Dr. Bjorn Pippin.    Arvilla Meres, MD  11:20 AM

## 2020-09-13 ENCOUNTER — Encounter (HOSPITAL_COMMUNITY): Payer: Self-pay | Admitting: Internal Medicine

## 2020-09-13 ENCOUNTER — Other Ambulatory Visit: Payer: Self-pay

## 2020-09-13 ENCOUNTER — Ambulatory Visit (HOSPITAL_COMMUNITY)
Admission: RE | Admit: 2020-09-13 | Discharge: 2020-09-13 | Disposition: A | Discharge status: Home / Self Care | Payer: Medicare Other | Source: Ambulatory Visit | Attending: Internal Medicine | Admitting: Internal Medicine

## 2020-09-13 VITALS — BP 130/70 | HR 86 | Wt 121.2 lb

## 2020-09-13 DIAGNOSIS — I10 Essential (primary) hypertension: Secondary | ICD-10-CM

## 2020-09-13 DIAGNOSIS — I110 Hypertensive heart disease with heart failure: Secondary | ICD-10-CM | POA: Insufficient documentation

## 2020-09-13 DIAGNOSIS — Z79899 Other long term (current) drug therapy: Secondary | ICD-10-CM | POA: Insufficient documentation

## 2020-09-13 DIAGNOSIS — I5032 Chronic diastolic (congestive) heart failure: Secondary | ICD-10-CM | POA: Diagnosis not present

## 2020-09-13 DIAGNOSIS — Z7982 Long term (current) use of aspirin: Secondary | ICD-10-CM | POA: Insufficient documentation

## 2020-09-13 DIAGNOSIS — Z87891 Personal history of nicotine dependence: Secondary | ICD-10-CM | POA: Diagnosis not present

## 2020-09-13 DIAGNOSIS — I428 Other cardiomyopathies: Secondary | ICD-10-CM | POA: Diagnosis not present

## 2020-09-13 NOTE — Patient Instructions (Addendum)
No Labs done today.   No medication changes were made. Please continue all current medications as prescribed.  Your provider has requested that you have an echo soon. Your physician has requested that you have an echocardiogram. Echocardiography is a painless test that uses sound waves to create images of your heart. It provides your doctor with information about the size and shape of your heart and how well your heart's chambers and valves are working. This procedure takes approximately one hour. There are no restrictions for this procedure.  CONGRATULATIONS YOU HAVE GRADUATED THE [facility]!!! Please follow up with [facility] in one year. They will contact you to schedule an appointment.  If you have any questions or concerns before your next appointment please send [place] a message through [place] or call our office at [phone].    TO LEAVE A MESSAGE FOR THE NURSE SELECT OPTION 2, PLEASE LEAVE A MESSAGE INCLUDING: . YOUR NAME . DATE OF BIRTH . CALL BACK NUMBER . REASON FOR CALL**this is important as we prioritize the call backs  YOU WILL RECEIVE A CALL BACK THE SAME DAY AS LONG AS YOU CALL BEFORE 4:00 PM  At the [facility], you and your health needs are our priority. As part of our continuing mission to provide you with exceptional heart care, we have created designated Provider Care Teams. These Care Teams include your primary Cardiologist (physician) and Advanced Practice Providers (APPs- Physician Assistants and Nurse Practitioners) who all work together to provide you with the care you need, when you need it.   You may see any of the following providers on your designated Care Team at your next follow up: Marland Kitchen Dr Arvilla Meres . Dr Marca Ancona . Tonye Becket, NP . Robbie Lis, PA . Karle Plumber, PharmD   Please be sure to bring in all your medications bottles to every appointment.

## 2020-10-18 ENCOUNTER — Other Ambulatory Visit: Payer: Self-pay

## 2020-10-18 ENCOUNTER — Ambulatory Visit (HOSPITAL_COMMUNITY)
Admission: RE | Admit: 2020-10-18 | Discharge: 2020-10-18 | Disposition: A | Discharge status: Home / Self Care | Payer: Medicare Other | Source: Ambulatory Visit | Attending: Internal Medicine | Admitting: Internal Medicine

## 2020-10-18 DIAGNOSIS — I5032 Chronic diastolic (congestive) heart failure: Secondary | ICD-10-CM | POA: Insufficient documentation

## 2020-10-18 NOTE — Progress Notes (Signed)
  Echocardiogram 2D Echocardiogram with 3D and strain has been performed.  Leta Jungling M 10/18/2020, 3:30 PM

## 2020-10-22 NOTE — Progress Notes (Deleted)
Cardiology Office Note:    Date:  10/22/2020   ID:  [name] [name], DOB February 07, 1951, MRN [id number]  PCP:  Rodrigo Ran, MD  Cardiologist:  No primary care provider on file.  Electrophysiologist:  None   Referring MD: Dolores Patty, MD   No chief complaint on file. ***  History of Present Illness:    [name] [name] is a 70 y.o. female with a hx of an ICM with EF 25 to 35% in 2006 with recovery to normal systolic function now with chronic diastolic heart failure, tobacco use, hypertension, fibromyalgia who presents for initial clinic visit.  She previously followed with Dr. Gala Romney in the advanced heart failure clinic but was referred to general cardiology given resolution of her systolic heart failure.  Underwent a left heart catheterization in 2006, which showed no coronary disease.  Stress echocardiogram in 2014 was normal.  Echocardiogram 01/2016 showed LVEF 55%, grade 1 diastolic dysfunction.  Past Medical History:  Diagnosis Date  . Congestive heart failure (HCC)   . Fibromyalgia   . Fibromyalgia   . Hearing loss of both ears   . Hypertension   . Lung nodules   . Migraines    controlled with meds; down to once or twice yearly  . Osteoporosis     Past Surgical History:  Procedure Laterality Date  . BACK SURGERY  1986   ruptured disc  . BREAST CYST ASPIRATION Right   . BREAST SURGERY Right 1973   benign tumor  . fibromas removed    . HYSTEROSCOPY  11/98   with polypectomy  . TONSILLECTOMY     1960  . TUBAL LIGATION      Current Medications: No outpatient medications have been marked as taking for the 10/23/20 encounter (Appointment) with Little Ishikawa, MD.     Allergies:   Citrus, Lactose intolerance (gi), Silicon, Sulfa antibiotics, and Escitalopram oxalate   Social History   Socioeconomic History  . Marital status: Widowed    Spouse name: Not on file  . Number of children: 3  . Years of education: Not on file  . Highest education level: High  school graduate  Occupational History  . Occupation: Chief Technology Officer: [employer]  Tobacco Use  . Smoking status: Former Smoker    Packs/day: 0.30    Types: Cigarettes    Quit date: 12/20/2014    Years since quitting: 5.8  . Smokeless tobacco: Never Used  . Tobacco comment: 2 packs per week  Vaping Use  . Vaping Use: Never used  Substance and Sexual Activity  . Alcohol use: No  . Drug use: No  . Sexual activity: Not Currently    Partners: Male    Birth control/protection: Post-menopausal  Other Topics Concern  . Not on file  Social History Narrative   Lives at home with her husband   Left handed   Drinks about 2 cups of caffeine daily   Social Determinants of Health   Financial Resource Strain: Not on file  Food Insecurity: Not on file  Transportation Needs: Not on file  Physical Activity: Not on file  Stress: Not on file  Social Connections: Not on file     Family History: The patient's ***family history includes Hypertension in her father and mother. There is no history of Colon cancer.  ROS:   Please see the history of present illness.    *** All other systems reviewed and are negative.  EKGs/Labs/Other Studies Reviewed:  The following studies were reviewed today: ***  EKG:  EKG is *** ordered today.  The ekg ordered today demonstrates ***  Recent Labs: No results found for requested labs within last 8760 hours.  Recent Lipid Panel    Component Value Date/Time   CHOL 278 10/21/2008 0000   TRIG 110 10/21/2008 0000   HDL 49 10/21/2008 0000   LDLCALC 207 10/21/2008 0000    Physical Exam:    VS:  LMP 01/22/2001 (Approximate)     Wt Readings from Last 3 Encounters:  09/13/20 121 lb 3.2 oz (55 kg)  02/19/19 121 lb (54.9 kg)  09/03/17 127 lb 6.4 oz (57.8 kg)     GEN: *** Well nourished, well developed in no acute distress HEENT: Normal NECK: No JVD; No carotid bruits LYMPHATICS: No lymphadenopathy CARDIAC: ***RRR, no murmurs,  rubs, gallops RESPIRATORY:  Clear to auscultation without rales, wheezing or rhonchi  ABDOMEN: Soft, non-tender, non-distended MUSCULOSKELETAL:  No edema; No deformity  SKIN: Warm and dry NEUROLOGIC:  Alert and oriented x 3 PSYCHIATRIC:  Normal affect   ASSESSMENT:    No diagnosis found. PLAN:    ChronicDiastolicHeart Failure- PreviousNICM .   LHC 2006 with normal cors. EF recovered in 2014 ~55%.Last echo 8/17 EF 55% - Doing well. Volume status looks ok  - Will get f/u echo to ensure stability of EF  2. HTN - Blood pressure well controlled. Continue current regimen.  3. Former Smoker - quit 5 years ago  Medication Adjustments/Labs and Tests Ordered: Current medicines are reviewed at length with the patient today.  Concerns regarding medicines are outlined above.  No orders of the defined types were placed in this encounter.  No orders of the defined types were placed in this encounter.   There are no Patient Instructions on file for this visit.   Signed, Little Ishikawa, MD  10/22/2020 2:17 PM    [facility]

## 2020-10-23 ENCOUNTER — Ambulatory Visit (INDEPENDENT_AMBULATORY_CARE_PROVIDER_SITE_OTHER): Payer: Medicare Other | Admitting: Cardiology

## 2020-10-23 ENCOUNTER — Other Ambulatory Visit: Payer: Self-pay

## 2020-10-23 ENCOUNTER — Encounter: Payer: Self-pay | Admitting: Cardiology

## 2020-10-23 VITALS — BP 140/78 | HR 49 | Ht 60.0 in | Wt 118.6 lb

## 2020-10-23 DIAGNOSIS — I10 Essential (primary) hypertension: Secondary | ICD-10-CM | POA: Diagnosis not present

## 2020-10-23 DIAGNOSIS — I5032 Chronic diastolic (congestive) heart failure: Secondary | ICD-10-CM | POA: Diagnosis not present

## 2020-10-23 MED ORDER — POTASSIUM CHLORIDE ER 20 MEQ PO TBCR: 1.0000 | EXTENDED_RELEASE_TABLET | ORAL | 3 refills | Status: DC | PRN

## 2020-10-23 MED ORDER — FUROSEMIDE 20 MG PO TABS: 20.0000 mg | ORAL_TABLET | ORAL | 3 refills | Status: DC | PRN

## 2020-10-23 NOTE — Addendum Note (Signed)
Addended by: Johney Frame A on: 10/23/2020 03:18 PM   Modules accepted: Orders

## 2020-10-23 NOTE — Patient Instructions (Signed)
Medication Instructions:  Take furosemide (Lasix) 20 mg + potassium 20 meq AS NEEDED for weight increase of 3 lbs overnight or 5 lbs in 1 week.   *If you need a refill on your cardiac medications before your next appointment, please call your pharmacy*  Follow-Up: At [facility], you and your health needs are our priority.  As part of our continuing mission to provide you with exceptional heart care, we have created designated Provider Care Teams.  These Care Teams include your primary Cardiologist (physician) and Advanced Practice Providers (APPs -  Physician Assistants and Nurse Practitioners) who all work together to provide you with the care you need, when you need it.  We recommend signing up for the patient portal called "MyChart".  Sign up information is provided on this After Visit Summary.  MyChart is used to connect with patients for Virtual Visits (Telemedicine).  Patients are able to view lab/test results, encounter notes, upcoming appointments, etc.  Non-urgent messages can be sent to your provider as well.   To learn more about what you can do with MyChart, go to [email or web address].    Your next appointment:   12 month(s)  The format for your next appointment:   In Person  Provider:   Epifanio Lesches, MD   Other Instructions Please check your blood pressure at home daily, write it down.  Call the office or send message via Mychart with the readings in 2 weeks for Dr. Bjorn Pippin to review.   Weigh yourself daily, write it down.   If you gain 3 lbs overnight or 5 lbs in 1 week, take a dose of lasix + potassium as prescribed.

## 2020-10-23 NOTE — Progress Notes (Signed)
Cardiology Office Note:    Date:  10/23/2020   ID:  [name] [name], DOB 10/21/1950, MRN [id number]  PCP:  Rodrigo Ran, MD  Cardiologist:  No primary care provider on file.  Electrophysiologist:  None   Referring MD: Dolores Patty, MD   Chief Complaint  Patient presents with  . Congestive Heart Failure    History of Present Illness:    [name] [name] is a 70 y.o. female with a hx of an ICM with EF 25 to 35% in 2006 with recovery to normal systolic function now with chronic diastolic heart failure, tobacco use, hypertension, fibromyalgia who presents for initial clinic visit.  She previously followed with Dr. Gala Romney in the advanced heart failure clinic but was referred to general cardiology given resolution of her systolic heart failure.  Underwent a left heart catheterization in 2006, which showed no coronary disease.  Stress echocardiogram in 2014 was normal.  Echocardiogram 01/2016 showed LVEF 55%, grade 1 diastolic dysfunction.  Today she is accompanied by her friend Reynold Bowen, who is also a patient. They have back-to-back appointments and wish to be seen together. Since last clinic visit, she has felt some lightheadedness that she attributes to her lactose intolerance. For exercise she walks at least 3000 steps a day (upwards of 02637 steps), but has no exertional symptoms. However, she does get short of breath when climbing to the top of the stairs, which she attributes to her age. Her at home blood pressure is well controlled and ranges around 138/70. During the past 2 years she has lost about 8 pounds. She quit smoking years ago. Her regimen includes 10 mg of Lasix once a day, and her potassium supplement every other day. She denies any chest pain or palpitations. No pre-syncope, syncope, lower extremity edema, orthopnea or PND.   Past Medical History:  Diagnosis Date  . Congestive heart failure (HCC)   . Fibromyalgia   . Fibromyalgia   . Hearing loss of both ears   .  Hypertension   . Lung nodules   . Migraines    controlled with meds; down to once or twice yearly  . Osteoporosis     Past Surgical History:  Procedure Laterality Date  . BACK SURGERY  1986   ruptured disc  . BREAST CYST ASPIRATION Right   . BREAST SURGERY Right 1973   benign tumor  . fibromas removed    . HYSTEROSCOPY  11/98   with polypectomy  . TONSILLECTOMY     1960  . TUBAL LIGATION      Current Medications: Current Meds  Medication Sig  . acetaminophen (TYLENOL) 500 MG tablet Take 1,000 mg by mouth at bedtime.  Marland Kitchen amLODipine (NORVASC) 5 MG tablet Take 2.5 mg by mouth daily.  Marland Kitchen aspirin 81 MG chewable tablet Chew 81 mg by mouth at bedtime.  Marland Kitchen buPROPion (WELLBUTRIN SR) 150 MG 12 hr tablet Take 150 mg by mouth daily.   . calcium carbonate (OS-CAL) 600 MG TABS Take 600 mg by mouth daily.  . carvedilol (COREG) 12.5 MG tablet Take 1 tablet (12.5 mg total) by mouth 2 (two) times daily with a meal.  . Cinnamon 500 MG TABS Take 500 mg by mouth daily.  . Coenzyme Q10 (CO Q 10) 100 MG CAPS Take 100 mg by mouth 2 (two) times daily.  . cyclobenzaprine (FLEXERIL) 10 MG tablet Take 10 mg by mouth at bedtime.  . docusate sodium (COLACE) 100 MG capsule Take 100 mg by mouth daily.  Marland Kitchen  fish oil-omega-3 fatty acids 1000 MG capsule Take 1 g by mouth 2 (two) times daily at 10 AM and 5 PM.  . gabapentin (NEURONTIN) 100 MG capsule Take 200 mg by mouth at bedtime.   . Hypromellose (ARTIFICIAL TEARS OP) Place 1 drop into both eyes daily as needed (dry eyes).  Marland Kitchen KLOR-CON M20 20 MEQ tablet Take 20 mEq by mouth every other day.   . Multiple Vitamin (MULTIVITAMIN) tablet Take 1 tablet by mouth daily.  . pravastatin (PRAVACHOL) 20 MG tablet Take 20 mg by mouth 3 (three) times a week. M W F  . QUERCETIN PO Take 1 tablet by mouth daily.  . Riboflavin (VITAMIN B-2 PO) Take 1 tablet by mouth daily.  . SUMAtriptan (IMITREX) 50 MG tablet Take 25-50 mg by mouth once as needed for migraines  . TURMERIC PO  Take 1 capsule by mouth daily.   . Vitamin E (E 400 BLENDED PO) Take 400 mg by mouth 4 (four) times a week.  . zinc gluconate 50 MG tablet Take 25 mg by mouth daily.  Marland Kitchen zolpidem (AMBIEN) 10 MG tablet Take 10 mg by mouth at bedtime as needed for sleep.   . [DISCONTINUED] furosemide (LASIX) 20 MG tablet Take 20 mg by mouth daily.  . [DISCONTINUED] Potassium Chloride ER 20 MEQ TBCR Take 1 tablet by mouth every other day.     Allergies:   Citrus, Lactose intolerance (gi), Silicon, Sulfa antibiotics, and Escitalopram oxalate   Social History   Socioeconomic History  . Marital status: Widowed    Spouse name: Not on file  . Number of children: 3  . Years of education: Not on file  . Highest education level: High school graduate  Occupational History  . Occupation: Chief Technology Officer: [employer]  Tobacco Use  . Smoking status: Former Smoker    Packs/day: 0.30    Types: Cigarettes    Quit date: 12/20/2014    Years since quitting: 5.8  . Smokeless tobacco: Never Used  . Tobacco comment: 2 packs per week  Vaping Use  . Vaping Use: Never used  Substance and Sexual Activity  . Alcohol use: No  . Drug use: No  . Sexual activity: Not Currently    Partners: Male    Birth control/protection: Post-menopausal  Other Topics Concern  . Not on file  Social History Narrative   Lives at home with her husband   Left handed   Drinks about 2 cups of caffeine daily   Social Determinants of Health   Financial Resource Strain: Not on file  Food Insecurity: Not on file  Transportation Needs: Not on file  Physical Activity: Not on file  Stress: Not on file  Social Connections: Not on file     Family History: The patient's family history includes Hypertension in her father and mother. There is no history of Colon cancer.  ROS:   Please see the history of present illness. (+)  Lightheadedness (+) Shortness of breath All other systems reviewed and are  negative.  EKGs/Labs/Other Studies Reviewed:    The following studies were reviewed today:   EKG:   10/23/2020: EKG is not ordered today.    Recent Labs: No results found for requested labs within last 8760 hours.  Recent Lipid Panel    Component Value Date/Time   CHOL 278 10/21/2008 0000   TRIG 110 10/21/2008 0000   HDL 49 10/21/2008 0000   LDLCALC 207 10/21/2008 0000    Physical  Exam:    VS:  BP 140/78   Pulse (!) 49   Ht 5' (1.524 m)   Wt 118 lb 9.6 oz (53.8 kg)   LMP 01/22/2001 (Approximate)   SpO2 98%   BMI 23.16 kg/m     Wt Readings from Last 3 Encounters:  10/23/20 118 lb 9.6 oz (53.8 kg)  09/13/20 121 lb 3.2 oz (55 kg)  02/19/19 121 lb (54.9 kg)     GEN: Well nourished, well developed in no acute distress HEENT: Normal NECK: No JVD; No carotid bruits LYMPHATICS: No lymphadenopathy CARDIAC: RRR, no murmurs, rubs, gallops RESPIRATORY:  Clear to auscultation without rales, wheezing or rhonchi  ABDOMEN: Soft, non-tender, non-distended MUSCULOSKELETAL:  No edema; No deformity  SKIN: Warm and dry NEUROLOGIC:  Alert and oriented x 3 PSYCHIATRIC:  Normal affect   ASSESSMENT:    1. Chronic diastolic CHF (congestive heart failure) (HCC)   2. Essential hypertension    PLAN:    ChronicDiastolicHeart Failure- PreviousNICM .   LHC 2006 with normal cors. EF recovered in 2014 ~55%.Last echo 8/17 EF 55% - Currently taking Lasix 10 mg daily.  Appears euvolemic, will switch Lasix to as needed.  Advised to monitor daily weights and can take Lasix if gains more than 3 pounds in 1 day or 5 pounds in a week.  We will also change potassium supplement to as needed if taking Lasix - Continue coreg 12.5 mg BID - Repeat echo 10/18/2020 report pending.  Personally reviewed images, appears normal LV systolic function  2. HTN - Continue carvedilol 25 mg twice daily and amlodipine 2.5 mg a day.  BP mildly elevated in clinic today but reports under good control at home.   Asked patient to check BP daily for next week and call with results  3. Former Smoker - quit 5 years ago  Return to clinic in 1 year.  Medication Adjustments/Labs and Tests Ordered: Current medicines are reviewed at length with the patient today.  Concerns regarding medicines are outlined above.  No orders of the defined types were placed in this encounter.  Meds ordered this encounter  Medications  . furosemide (LASIX) 20 MG tablet    Sig: Take 1 tablet (20 mg total) by mouth as needed (weight increase of 3 lbs overnight or 5 lbs total in 1 week).    Dispense:  30 tablet    Refill:  3  . Potassium Chloride ER 20 MEQ TBCR    Sig: Take 1 tablet by mouth as needed (with furosemide (Lasix) if weight increase of 3 lbs overnight or 5 lbs in 1 week).    Dispense:  30 tablet    Refill:  3    Patient Instructions  Medication Instructions:  Take furosemide (Lasix) 20 mg + potassium 20 meq AS NEEDED for weight increase of 3 lbs overnight or 5 lbs in 1 week.   *If you need a refill on your cardiac medications before your next appointment, please call your pharmacy*  Follow-Up: At [facility], you and your health needs are our priority.  As part of our continuing mission to provide you with exceptional heart care, we have created designated Provider Care Teams.  These Care Teams include your primary Cardiologist (physician) and Advanced Practice Providers (APPs -  Physician Assistants and Nurse Practitioners) who all work together to provide you with the care you need, when you need it.  We recommend signing up for the patient portal called "MyChart".  Sign up information is provided  on this After Visit Summary.  MyChart is used to connect with patients for Virtual Visits (Telemedicine).  Patients are able to view lab/test results, encounter notes, upcoming appointments, etc.  Non-urgent messages can be sent to your provider as well.   To learn more about what you can do with MyChart, go to  [email or web address].    Your next appointment:   12 month(s)  The format for your next appointment:   In Person  Provider:   Epifanio Lesches, MD   Other Instructions Please check your blood pressure at home daily, write it down.  Call the office or send message via Mychart with the readings in 2 weeks for Dr. Bjorn Pippin to review.   Weigh yourself daily, write it down.   If you gain 3 lbs overnight or 5 lbs in 1 week, take a dose of lasix + potassium as prescribed.     I,Mathew Stumpf,acting as a Neurosurgeon for Little Ishikawa, MD.,have documented all relevant documentation on the behalf of Little Ishikawa, MD,as directed by  Little Ishikawa, MD while in the presence of Little Ishikawa, MD.  I, Little Ishikawa, MD, have reviewed all documentation for this visit. The documentation on 10/23/20 for the exam, diagnosis, procedures, and orders are all accurate and complete.   Signed, Little Ishikawa, MD  10/23/2020 2:51 PM    [facility]

## 2020-10-24 LAB — BASIC METABOLIC PANEL
BUN/Creatinine Ratio: 17 (ref 12–28)
BUN: 17 mg/dL (ref 8–27)
CO2: 27 mmol/L (ref 20–29)
Calcium: 9.9 mg/dL (ref 8.7–10.3)
Chloride: 103 mmol/L (ref 96–106)
Creatinine, Ser: 0.98 mg/dL (ref 0.57–1.00)
Glucose: 104 mg/dL — ABNORMAL HIGH (ref 65–99)
Potassium: 4.4 mmol/L (ref 3.5–5.2)
Sodium: 144 mmol/L (ref 134–144)
eGFR: 62 mL/min/{1.73_m2} (ref >59)

## 2020-11-05 ENCOUNTER — Other Ambulatory Visit: Payer: Self-pay | Admitting: Cardiology

## 2020-11-06 NOTE — Telephone Encounter (Signed)
This is Dr. Schumann's pt 

## 2020-11-29 DIAGNOSIS — E785 Hyperlipidemia, unspecified: Secondary | ICD-10-CM | POA: Diagnosis not present

## 2020-11-29 DIAGNOSIS — Z0000 Encounter for general adult medical examination without abnormal findings: Secondary | ICD-10-CM | POA: Diagnosis not present

## 2020-11-29 DIAGNOSIS — M859 Disorder of bone density and structure, unspecified: Secondary | ICD-10-CM | POA: Diagnosis not present

## 2020-11-29 DIAGNOSIS — R7301 Impaired fasting glucose: Secondary | ICD-10-CM | POA: Diagnosis not present

## 2020-12-04 DIAGNOSIS — Z1212 Encounter for screening for malignant neoplasm of rectum: Secondary | ICD-10-CM | POA: Diagnosis not present

## 2020-12-06 DIAGNOSIS — E785 Hyperlipidemia, unspecified: Secondary | ICD-10-CM | POA: Diagnosis not present

## 2020-12-06 DIAGNOSIS — Z0000 Encounter for general adult medical examination without abnormal findings: Secondary | ICD-10-CM | POA: Diagnosis not present

## 2020-12-06 DIAGNOSIS — H9190 Unspecified hearing loss, unspecified ear: Secondary | ICD-10-CM | POA: Diagnosis not present

## 2020-12-06 DIAGNOSIS — M8580 Other specified disorders of bone density and structure, unspecified site: Secondary | ICD-10-CM | POA: Diagnosis not present

## 2020-12-06 DIAGNOSIS — M810 Age-related osteoporosis without current pathological fracture: Secondary | ICD-10-CM | POA: Diagnosis not present

## 2020-12-06 DIAGNOSIS — N182 Chronic kidney disease, stage 2 (mild): Secondary | ICD-10-CM | POA: Diagnosis not present

## 2020-12-06 DIAGNOSIS — Z79899 Other long term (current) drug therapy: Secondary | ICD-10-CM | POA: Diagnosis not present

## 2020-12-06 DIAGNOSIS — R7301 Impaired fasting glucose: Secondary | ICD-10-CM | POA: Diagnosis not present

## 2020-12-06 DIAGNOSIS — I129 Hypertensive chronic kidney disease with stage 1 through stage 4 chronic kidney disease, or unspecified chronic kidney disease: Secondary | ICD-10-CM | POA: Diagnosis not present

## 2020-12-06 DIAGNOSIS — I509 Heart failure, unspecified: Secondary | ICD-10-CM | POA: Diagnosis not present

## 2020-12-06 DIAGNOSIS — Z1331 Encounter for screening for depression: Secondary | ICD-10-CM | POA: Diagnosis not present

## 2020-12-06 DIAGNOSIS — R918 Other nonspecific abnormal finding of lung field: Secondary | ICD-10-CM | POA: Diagnosis not present

## 2020-12-06 DIAGNOSIS — I10 Essential (primary) hypertension: Secondary | ICD-10-CM | POA: Diagnosis not present

## 2020-12-12 ENCOUNTER — Other Ambulatory Visit: Payer: Self-pay | Admitting: Internal Medicine

## 2020-12-12 DIAGNOSIS — E785 Hyperlipidemia, unspecified: Secondary | ICD-10-CM

## 2020-12-15 ENCOUNTER — Telehealth: Payer: Self-pay | Admitting: Cardiology

## 2020-12-15 LAB — ECHOCARDIOGRAM COMPLETE
Area-P 1/2: 2 cm2
S' Lateral: 3.4 cm

## 2020-12-15 NOTE — Telephone Encounter (Signed)
Pt called stating she never received  ECHO results from 4/27 that was ordered by Dr. Gala Romney. Per chart review, pt was seen by Dr. Bjorn Pippin on 5/2 but results were pending at that time. Will forward to both MDs to make aware.

## 2020-12-15 NOTE — Telephone Encounter (Signed)
Looks like echo was not read, I put a read in- normal heart pumping function, no significant valve disease

## 2020-12-15 NOTE — Telephone Encounter (Signed)
Patient's called to say that she got an Echo done in end march beginnng of April. And she havent received thr rescounds from that as of yet. Please advise

## 2020-12-15 NOTE — Telephone Encounter (Signed)
Patient aware and verbalized understanding. °

## 2020-12-21 DIAGNOSIS — M9901 Segmental and somatic dysfunction of cervical region: Secondary | ICD-10-CM | POA: Diagnosis not present

## 2020-12-21 DIAGNOSIS — M5411 Radiculopathy, occipito-atlanto-axial region: Secondary | ICD-10-CM | POA: Diagnosis not present

## 2020-12-27 DIAGNOSIS — M5411 Radiculopathy, occipito-atlanto-axial region: Secondary | ICD-10-CM | POA: Diagnosis not present

## 2020-12-27 DIAGNOSIS — M9901 Segmental and somatic dysfunction of cervical region: Secondary | ICD-10-CM | POA: Diagnosis not present

## 2020-12-28 DIAGNOSIS — M5411 Radiculopathy, occipito-atlanto-axial region: Secondary | ICD-10-CM | POA: Diagnosis not present

## 2020-12-28 DIAGNOSIS — M9901 Segmental and somatic dysfunction of cervical region: Secondary | ICD-10-CM | POA: Diagnosis not present

## 2020-12-29 ENCOUNTER — Ambulatory Visit
Admission: RE | Admit: 2020-12-29 | Discharge: 2020-12-29 | Disposition: A | Discharge status: Home / Self Care | Payer: No Typology Code available for payment source | Source: Ambulatory Visit | Attending: Internal Medicine | Admitting: Internal Medicine

## 2020-12-29 DIAGNOSIS — E785 Hyperlipidemia, unspecified: Secondary | ICD-10-CM

## 2021-01-08 DIAGNOSIS — M9901 Segmental and somatic dysfunction of cervical region: Secondary | ICD-10-CM | POA: Diagnosis not present

## 2021-01-08 DIAGNOSIS — M5411 Radiculopathy, occipito-atlanto-axial region: Secondary | ICD-10-CM | POA: Diagnosis not present

## 2021-01-11 DIAGNOSIS — M9901 Segmental and somatic dysfunction of cervical region: Secondary | ICD-10-CM | POA: Diagnosis not present

## 2021-01-11 DIAGNOSIS — M5411 Radiculopathy, occipito-atlanto-axial region: Secondary | ICD-10-CM | POA: Diagnosis not present

## 2021-01-15 DIAGNOSIS — M9901 Segmental and somatic dysfunction of cervical region: Secondary | ICD-10-CM | POA: Diagnosis not present

## 2021-01-15 DIAGNOSIS — M5411 Radiculopathy, occipito-atlanto-axial region: Secondary | ICD-10-CM | POA: Diagnosis not present

## 2021-01-18 DIAGNOSIS — M9901 Segmental and somatic dysfunction of cervical region: Secondary | ICD-10-CM | POA: Diagnosis not present

## 2021-01-18 DIAGNOSIS — M5411 Radiculopathy, occipito-atlanto-axial region: Secondary | ICD-10-CM | POA: Diagnosis not present

## 2021-01-22 DIAGNOSIS — M9901 Segmental and somatic dysfunction of cervical region: Secondary | ICD-10-CM | POA: Diagnosis not present

## 2021-01-22 DIAGNOSIS — M5411 Radiculopathy, occipito-atlanto-axial region: Secondary | ICD-10-CM | POA: Diagnosis not present

## 2021-01-25 DIAGNOSIS — M5411 Radiculopathy, occipito-atlanto-axial region: Secondary | ICD-10-CM | POA: Diagnosis not present

## 2021-01-25 DIAGNOSIS — M9901 Segmental and somatic dysfunction of cervical region: Secondary | ICD-10-CM | POA: Diagnosis not present

## 2021-01-29 DIAGNOSIS — M9901 Segmental and somatic dysfunction of cervical region: Secondary | ICD-10-CM | POA: Diagnosis not present

## 2021-01-29 DIAGNOSIS — M5411 Radiculopathy, occipito-atlanto-axial region: Secondary | ICD-10-CM | POA: Diagnosis not present

## 2021-02-05 DIAGNOSIS — M9901 Segmental and somatic dysfunction of cervical region: Secondary | ICD-10-CM | POA: Diagnosis not present

## 2021-02-05 DIAGNOSIS — M5411 Radiculopathy, occipito-atlanto-axial region: Secondary | ICD-10-CM | POA: Diagnosis not present

## 2021-02-08 DIAGNOSIS — M5411 Radiculopathy, occipito-atlanto-axial region: Secondary | ICD-10-CM | POA: Diagnosis not present

## 2021-02-08 DIAGNOSIS — M9901 Segmental and somatic dysfunction of cervical region: Secondary | ICD-10-CM | POA: Diagnosis not present

## 2021-02-15 DIAGNOSIS — M9901 Segmental and somatic dysfunction of cervical region: Secondary | ICD-10-CM | POA: Diagnosis not present

## 2021-02-15 DIAGNOSIS — M5411 Radiculopathy, occipito-atlanto-axial region: Secondary | ICD-10-CM | POA: Diagnosis not present

## 2021-02-21 DIAGNOSIS — M9901 Segmental and somatic dysfunction of cervical region: Secondary | ICD-10-CM | POA: Diagnosis not present

## 2021-02-21 DIAGNOSIS — M5411 Radiculopathy, occipito-atlanto-axial region: Secondary | ICD-10-CM | POA: Diagnosis not present

## 2021-03-01 DIAGNOSIS — M9901 Segmental and somatic dysfunction of cervical region: Secondary | ICD-10-CM | POA: Diagnosis not present

## 2021-03-01 DIAGNOSIS — M5411 Radiculopathy, occipito-atlanto-axial region: Secondary | ICD-10-CM | POA: Diagnosis not present

## 2021-03-02 ENCOUNTER — Encounter: Payer: Self-pay | Admitting: Internal Medicine

## 2021-03-08 DIAGNOSIS — M9901 Segmental and somatic dysfunction of cervical region: Secondary | ICD-10-CM | POA: Diagnosis not present

## 2021-03-08 DIAGNOSIS — M5411 Radiculopathy, occipito-atlanto-axial region: Secondary | ICD-10-CM | POA: Diagnosis not present

## 2021-03-21 DIAGNOSIS — M5411 Radiculopathy, occipito-atlanto-axial region: Secondary | ICD-10-CM | POA: Diagnosis not present

## 2021-03-21 DIAGNOSIS — M9901 Segmental and somatic dysfunction of cervical region: Secondary | ICD-10-CM | POA: Diagnosis not present

## 2021-04-04 DIAGNOSIS — M9901 Segmental and somatic dysfunction of cervical region: Secondary | ICD-10-CM | POA: Diagnosis not present

## 2021-04-04 DIAGNOSIS — M5411 Radiculopathy, occipito-atlanto-axial region: Secondary | ICD-10-CM | POA: Diagnosis not present

## 2021-04-19 DIAGNOSIS — M9901 Segmental and somatic dysfunction of cervical region: Secondary | ICD-10-CM | POA: Diagnosis not present

## 2021-04-19 DIAGNOSIS — M5411 Radiculopathy, occipito-atlanto-axial region: Secondary | ICD-10-CM | POA: Diagnosis not present

## 2021-04-23 ENCOUNTER — Other Ambulatory Visit: Payer: Self-pay

## 2021-04-23 ENCOUNTER — Ambulatory Visit: Payer: Medicare Other | Admitting: *Deleted

## 2021-04-23 VITALS — Ht 60.0 in | Wt 115.0 lb

## 2021-04-23 DIAGNOSIS — Z1211 Encounter for screening for malignant neoplasm of colon: Secondary | ICD-10-CM

## 2021-04-23 NOTE — Progress Notes (Signed)
Pt's previsit is done over the phone and all paperwork (prep instructions, blank consent form to just read over) sent to patient, email and MyChart Pt's name and DOB verified at the beginning of the previsit.  Pt denies any difficulty with ambulating.    No trouble with anesthesia, denies being told they were difficult to intubate, or hx/fam hx of malignant hyperthermia per pt   No egg or soy allergy  No home oxygen use   No medications for weight loss taken  emmi information given  Pt denies constipation issues  Pt informed that we do not do prior authorizations for prep

## 2021-04-24 MED ORDER — PLENVU 140 G PO SOLR: 1.0000 | Freq: Once | ORAL | 0 refills | Status: AC

## 2021-04-27 ENCOUNTER — Telehealth: Payer: Self-pay | Admitting: Internal Medicine

## 2021-04-27 NOTE — Telephone Encounter (Signed)
Inbound call from patient./ States she need prior authorization for Plenvu for insurance to cover.

## 2021-04-27 NOTE — Telephone Encounter (Signed)
Spoke with the patient. Explained that we do not do PA's. She will use Plenvu medicare coupon from the website.

## 2021-04-30 ENCOUNTER — Ambulatory Visit (AMBULATORY_SURGERY_CENTER): Payer: Medicare Other | Admitting: Internal Medicine

## 2021-04-30 ENCOUNTER — Encounter: Payer: Self-pay | Admitting: Internal Medicine

## 2021-04-30 VITALS — BP 167/70 | HR 76 | Temp 98.4°F | Resp 11 | Ht 60.0 in | Wt 115.0 lb

## 2021-04-30 DIAGNOSIS — Z1211 Encounter for screening for malignant neoplasm of colon: Secondary | ICD-10-CM | POA: Diagnosis not present

## 2021-04-30 MED ORDER — SODIUM CHLORIDE 0.9 % IV SOLN
500.0000 mL | Freq: Once | INTRAVENOUS | Status: DC
Start: 2021-04-30 — End: 2021-04-30

## 2021-04-30 NOTE — Patient Instructions (Signed)
Discharge instructions given. Normal exam. Resume previous medications. YOU HAD AN ENDOSCOPIC PROCEDURE TODAY AT THE [facility]:   Refer to the procedure report that was given to you for any specific questions about what was found during the examination.  If the procedure report does not answer your questions, please call your gastroenterologist to clarify.  If you requested that your care partner not be given the details of your procedure findings, then the procedure report has been included in a sealed envelope for you to review at your convenience later.  YOU SHOULD EXPECT: Some feelings of bloating in the abdomen. Passage of more gas than usual.  Walking can help get rid of the air that was put into your GI tract during the procedure and reduce the bloating. If you had a lower endoscopy (such as a colonoscopy or flexible sigmoidoscopy) you may notice spotting of blood in your stool or on the toilet paper. If you underwent a bowel prep for your procedure, you may not have a normal bowel movement for a few days.  Please Note:  You might notice some irritation and congestion in your nose or some drainage.  This is from the oxygen used during your procedure.  There is no need for concern and it should clear up in a day or so.  SYMPTOMS TO REPORT IMMEDIATELY:  Following lower endoscopy (colonoscopy or flexible sigmoidoscopy):  Excessive amounts of blood in the stool  Significant tenderness or worsening of abdominal pains  Swelling of the abdomen that is new, acute  Fever of 100F or higher   For urgent or emergent issues, a gastroenterologist can be reached at any hour by calling [phone]. Do not use MyChart messaging for urgent concerns.    DIET:  We do recommend a small meal at first, but then you may proceed to your regular diet.  Drink plenty of fluids but you should avoid alcoholic beverages for 24 hours.  ACTIVITY:  You should plan to take it easy for the rest of  today and you should NOT DRIVE or use heavy machinery until tomorrow (because of the sedation medicines used during the test).    FOLLOW UP: Our staff will call the number listed on your records 48-72 hours following your procedure to check on you and address any questions or concerns that you may have regarding the information given to you following your procedure. If we do not reach you, we will leave a message.  We will attempt to reach you two times.  During this call, we will ask if you have developed any symptoms of COVID 19. If you develop any symptoms (ie: fever, flu-like symptoms, shortness of breath, cough etc.) before then, please call [phone].  If you test positive for Covid 19 in the 2 weeks post procedure, please call and report this information to us.    If any biopsies were taken you will be contacted by phone or by letter within the next 1-3 weeks.  Please call us at [phone] if you have not heard about the biopsies in 3 weeks.    SIGNATURES/CONFIDENTIALITY: You and/or your care partner have signed paperwork which will be entered into your electronic medical record.  These signatures attest to the fact that that the information above on your After Visit Summary has been reviewed and is understood.  Full responsibility of the confidentiality of this discharge information lies with you and/or your care-partner.  

## 2021-04-30 NOTE — Progress Notes (Signed)
HISTORY OF PRESENT ILLNESS:  [name] [name] is a 70 y.o. female who presents today for screening colonoscopy.  Index examination October 2012 was normal.  No active complaints  REVIEW OF SYSTEMS:  All non-GI ROS negative. Past Medical History:  Diagnosis Date   Allergy    Arthritis    Chronic kidney disease    Congestive heart failure (HCC)    kidney   Fibromyalgia    Fibromyalgia    Hearing loss of both ears    Hypertension    Lung nodules    Migraines    controlled with meds; down to once or twice yearly   Osteoporosis     Past Surgical History:  Procedure Laterality Date   BACK SURGERY  1986   ruptured disc   BREAST CYST ASPIRATION Right    BREAST SURGERY Right 1973   benign tumor   CATARACT EXTRACTION Bilateral    COLONOSCOPY     fibromas removed     HYSTEROSCOPY  04/1997   with polypectomy   TONSILLECTOMY     1960   TUBAL LIGATION      Social History [facility]  reports that she quit smoking about 6 years ago. Her smoking use included cigarettes. She smoked an average of .3 packs per day. She has never used smokeless tobacco. She reports that she does not drink alcohol and does not use drugs.  family history includes Hypertension in her father and mother.  Allergies  Allergen Reactions   Citrus Other (See Comments)    MIGRAINES (no citric acid, either)   Lactose Intolerance (Gi)     N and V, BP drops   Silicon Swelling   Sulfa Antibiotics Nausea And Vomiting   Escitalopram Oxalate Other (See Comments)    Flu-like symptoms without fever       PHYSICAL EXAMINATION:   Vital signs: BP (!) 184/83   Pulse 85   Temp 98.4 F (36.9 C)   Resp 12   Ht 5' (1.524 m)   Wt 115 lb (52.2 kg)   LMP 01/22/2001 (Approximate)   SpO2 100%   BMI 22.46 kg/m  General: Well-developed, well-nourished, no acute distress HEENT: Sclerae are anicteric, conjunctiva pink. Oral mucosa intact Lungs: Clear Heart: Regular Abdomen: soft, nontender, nondistended, no obvious  ascites, no peritoneal signs, normal bowel sounds. No organomegaly. Extremities: No edema Psychiatric: alert and oriented x3. Cooperative    ASSESSMENT:  1.  Colon cancer screening.  Average risk   PLAN:  1.  Screening colonoscopy

## 2021-04-30 NOTE — Op Note (Signed)
[facility] Patient Name: [name] [name] Procedure Date: 04/30/2021 3:12 PM MRN: [id number] Endoscopist: Docia Chuck. Henrene Pastor , MD Age: 70 Referring MD:  Date of Birth: July 25, 1950 Gender: Female Account #: [id number] Procedure:                Colonoscopy Indications:              Screening for colorectal malignant neoplasm.                            Negative index exam October 2012 Medicines:                Monitored Anesthesia Care Procedure:                Pre-Anesthesia Assessment:                           - Prior to the procedure, a History and Physical                            was performed, and patient medications and                            allergies were reviewed. The patient's tolerance of                            previous anesthesia was also reviewed. The risks                            and benefits of the procedure and the sedation                            options and risks were discussed with the patient.                            All questions were answered, and informed consent                            was obtained. Prior Anticoagulants: The patient has                            taken no previous anticoagulant or antiplatelet                            agents. ASA Grade Assessment: II - A patient with                            mild systemic disease. After reviewing the risks                            and benefits, the patient was deemed in                            satisfactory condition to undergo the procedure.  After obtaining informed consent, the colonoscope                            was passed under direct vision. Throughout the                            procedure, the patient's blood pressure, pulse, and                            oxygen saturations were monitored continuously. The                            CF [id number] #[id number] was introduced through the anus                            and advanced to the the cecum,  identified by                            appendiceal orifice and ileocecal valve. The                            ileocecal valve, appendiceal orifice, and rectum                            were photographed. The quality of the bowel                            preparation was good. The colonoscopy was performed                            without difficulty. The patient tolerated the                            procedure well. The bowel preparation used was                            Plenvu via split dose instruction. Scope In: 3:37:20 PM Scope Out: 3:52:07 PM Scope Withdrawal Time: 0 hours 10 minutes 36 seconds  Total Procedure Duration: 0 hours 14 minutes 47 seconds  Findings:                 The entire examined colon appeared normal on direct                            and retroflexion views. Complications:            No immediate complications. Estimated blood loss:                            None. Estimated Blood Loss:     Estimated blood loss: none. Impression:               - The entire examined colon is normal on direct and  retroflexion views.                           - No specimens collected. Recommendation:           - Repeat colonoscopy is not recommended for                            screening purposes.                           - Patient has a contact number available for                            emergencies. The signs and symptoms of potential                            delayed complications were discussed with the                            patient. Return to normal activities tomorrow.                            Written discharge instructions were provided to the                            patient.                           - Resume previous diet.                           - Continue present medications. Docia Chuck. Henrene Pastor, MD 04/30/2021 3:58:33 PM This report has been signed electronically.

## 2021-04-30 NOTE — Progress Notes (Signed)
VS taken by  

## 2021-04-30 NOTE — Progress Notes (Signed)
Pt's states no medical or surgical changes since previsit or office visit. 

## 2021-04-30 NOTE — Progress Notes (Signed)
To PACU, VSS. Report to [email or web address].tb 

## 2021-05-02 ENCOUNTER — Telehealth: Payer: Self-pay

## 2021-05-02 NOTE — Telephone Encounter (Signed)
  Follow up Call-  Call back number 04/30/2021  Post procedure Call Back phone  # [phone]  Permission to leave phone message Yes  Some recent data might be hidden     Patient questions:  Do you have a fever, pain , or abdominal swelling? No. Pain Score  0 *  Have you tolerated food without any problems? Yes.    Have you been able to return to your normal activities? Yes.    Do you have any questions about your discharge instructions: Diet   No. Medications  No. Follow up visit  No.  Do you have questions or concerns about your Care? No.  Actions: * If pain score is 4 or above: No action needed, pain <4.  Have you developed a fever since your procedure? no  2.   Have you had an respiratory symptoms (SOB or cough) since your procedure? no  3.   Have you tested positive for COVID 19 since your procedure no  4.   Have you had any family members/close contacts diagnosed with the COVID 19 since your procedure?  no   If yes to any of these questions please route to Laverna Peace, RN and Karlton Lemon, RN

## 2021-05-16 DIAGNOSIS — M5411 Radiculopathy, occipito-atlanto-axial region: Secondary | ICD-10-CM | POA: Diagnosis not present

## 2021-05-16 DIAGNOSIS — M9901 Segmental and somatic dysfunction of cervical region: Secondary | ICD-10-CM | POA: Diagnosis not present

## 2021-05-31 DIAGNOSIS — Z1152 Encounter for screening for COVID-19: Secondary | ICD-10-CM | POA: Diagnosis not present

## 2021-05-31 DIAGNOSIS — U071 COVID-19: Secondary | ICD-10-CM | POA: Diagnosis not present

## 2021-05-31 DIAGNOSIS — R058 Other specified cough: Secondary | ICD-10-CM | POA: Diagnosis not present

## 2021-05-31 DIAGNOSIS — R519 Headache, unspecified: Secondary | ICD-10-CM | POA: Diagnosis not present

## 2021-05-31 DIAGNOSIS — R5383 Other fatigue: Secondary | ICD-10-CM | POA: Diagnosis not present

## 2021-05-31 DIAGNOSIS — R0981 Nasal congestion: Secondary | ICD-10-CM | POA: Diagnosis not present

## 2021-06-06 DIAGNOSIS — M5411 Radiculopathy, occipito-atlanto-axial region: Secondary | ICD-10-CM | POA: Diagnosis not present

## 2021-06-06 DIAGNOSIS — M9901 Segmental and somatic dysfunction of cervical region: Secondary | ICD-10-CM | POA: Diagnosis not present

## 2021-06-07 DIAGNOSIS — I10 Essential (primary) hypertension: Secondary | ICD-10-CM | POA: Diagnosis not present

## 2021-06-07 DIAGNOSIS — U071 COVID-19: Secondary | ICD-10-CM | POA: Diagnosis not present

## 2021-06-07 DIAGNOSIS — K219 Gastro-esophageal reflux disease without esophagitis: Secondary | ICD-10-CM | POA: Diagnosis not present

## 2021-06-07 DIAGNOSIS — I700 Atherosclerosis of aorta: Secondary | ICD-10-CM | POA: Diagnosis not present

## 2021-06-07 DIAGNOSIS — E785 Hyperlipidemia, unspecified: Secondary | ICD-10-CM | POA: Diagnosis not present

## 2021-06-07 DIAGNOSIS — R7301 Impaired fasting glucose: Secondary | ICD-10-CM | POA: Diagnosis not present

## 2021-06-07 DIAGNOSIS — M810 Age-related osteoporosis without current pathological fracture: Secondary | ICD-10-CM | POA: Diagnosis not present

## 2021-06-07 DIAGNOSIS — H9190 Unspecified hearing loss, unspecified ear: Secondary | ICD-10-CM | POA: Diagnosis not present

## 2021-06-07 DIAGNOSIS — I2510 Atherosclerotic heart disease of native coronary artery without angina pectoris: Secondary | ICD-10-CM | POA: Diagnosis not present

## 2021-06-07 DIAGNOSIS — I509 Heart failure, unspecified: Secondary | ICD-10-CM | POA: Diagnosis not present

## 2021-06-25 DIAGNOSIS — M5411 Radiculopathy, occipito-atlanto-axial region: Secondary | ICD-10-CM | POA: Diagnosis not present

## 2021-06-25 DIAGNOSIS — M9901 Segmental and somatic dysfunction of cervical region: Secondary | ICD-10-CM | POA: Diagnosis not present

## 2021-07-11 DIAGNOSIS — G510 Bell's palsy: Secondary | ICD-10-CM | POA: Diagnosis not present

## 2021-07-11 DIAGNOSIS — Z961 Presence of intraocular lens: Secondary | ICD-10-CM | POA: Diagnosis not present

## 2021-07-11 DIAGNOSIS — H52203 Unspecified astigmatism, bilateral: Secondary | ICD-10-CM | POA: Diagnosis not present

## 2021-07-11 DIAGNOSIS — H04123 Dry eye syndrome of bilateral lacrimal glands: Secondary | ICD-10-CM | POA: Diagnosis not present

## 2021-07-16 DIAGNOSIS — M5411 Radiculopathy, occipito-atlanto-axial region: Secondary | ICD-10-CM | POA: Diagnosis not present

## 2021-07-16 DIAGNOSIS — M9901 Segmental and somatic dysfunction of cervical region: Secondary | ICD-10-CM | POA: Diagnosis not present

## 2021-08-03 ENCOUNTER — Other Ambulatory Visit: Payer: Self-pay | Admitting: Internal Medicine

## 2021-08-03 DIAGNOSIS — Z1231 Encounter for screening mammogram for malignant neoplasm of breast: Secondary | ICD-10-CM

## 2021-08-06 DIAGNOSIS — M5411 Radiculopathy, occipito-atlanto-axial region: Secondary | ICD-10-CM | POA: Diagnosis not present

## 2021-08-06 DIAGNOSIS — M9901 Segmental and somatic dysfunction of cervical region: Secondary | ICD-10-CM | POA: Diagnosis not present

## 2021-08-16 ENCOUNTER — Ambulatory Visit
Admission: RE | Admit: 2021-08-16 | Discharge: 2021-08-16 | Disposition: A | Discharge status: Home / Self Care | Payer: Medicare Other | Source: Ambulatory Visit | Attending: Internal Medicine | Admitting: Internal Medicine

## 2021-08-16 DIAGNOSIS — Z1231 Encounter for screening mammogram for malignant neoplasm of breast: Secondary | ICD-10-CM | POA: Diagnosis not present

## 2021-08-21 DIAGNOSIS — H02831 Dermatochalasis of right upper eyelid: Secondary | ICD-10-CM | POA: Diagnosis not present

## 2021-08-21 DIAGNOSIS — H02834 Dermatochalasis of left upper eyelid: Secondary | ICD-10-CM | POA: Diagnosis not present

## 2021-08-21 DIAGNOSIS — H57813 Brow ptosis, bilateral: Secondary | ICD-10-CM | POA: Diagnosis not present

## 2021-08-27 DIAGNOSIS — M5411 Radiculopathy, occipito-atlanto-axial region: Secondary | ICD-10-CM | POA: Diagnosis not present

## 2021-08-27 DIAGNOSIS — M9901 Segmental and somatic dysfunction of cervical region: Secondary | ICD-10-CM | POA: Diagnosis not present

## 2021-09-13 ENCOUNTER — Telehealth: Payer: Self-pay | Admitting: *Deleted

## 2021-09-13 NOTE — Telephone Encounter (Signed)
Pt agreeable to plan of care for tele pre op appt 09/14/21 @ 2 pm. Pt asked for me to call her tomorrow to go over the medications as she is at a restaurant right now. Consent has been done though.  ? ?  ?Patient Consent for Virtual Visit  ? ? ?   ? ?[name] [name] has provided verbal consent on 09/13/2021 for a virtual visit (video or telephone). ? ? ?CONSENT FOR VIRTUAL VISIT FOR:  [name] [name]  ?By participating in this virtual visit I agree to the following: ? ?I hereby voluntarily request, consent and authorize [facility] and its employed or contracted physicians, physician assistants, nurse practitioners or other licensed health care professionals (the Practitioner), to provide me with telemedicine health care services (the ?Services") as deemed necessary by the treating Practitioner. I acknowledge and consent to receive the Services by the Practitioner via telemedicine. I understand that the telemedicine visit will involve communicating with the Practitioner through live audiovisual communication technology and the disclosure of certain medical information by electronic transmission. I acknowledge that I have been given the opportunity to request an in-person assessment or other available alternative prior to the telemedicine visit and am voluntarily participating in the telemedicine visit. ? ?I understand that I have the right to withhold or withdraw my consent to the use of telemedicine in the course of my care at any time, without affecting my right to future care or treatment, and that the Practitioner or I may terminate the telemedicine visit at any time. I understand that I have the right to inspect all information obtained and/or recorded in the course of the telemedicine visit and may receive copies of available information for a reasonable fee.  I understand that some of the potential risks of receiving the Services via telemedicine include:  ?Delay or interruption in medical evaluation due to  technological equipment failure or disruption; ?Information transmitted may not be sufficient (e.g. poor resolution of images) to allow for appropriate medical decision making by the Practitioner; and/or  ?In rare instances, security protocols could fail, causing a breach of personal health information. ? ?Furthermore, I acknowledge that it is my responsibility to provide information about my medical history, conditions and care that is complete and accurate to the best of my ability. I acknowledge that Practitioner's advice, recommendations, and/or decision may be based on factors not within their control, such as incomplete or inaccurate data provided by me or distortions of diagnostic images or specimens that may result from electronic transmissions. I understand that the practice of medicine is not an exact science and that Practitioner makes no warranties or guarantees regarding treatment outcomes. I acknowledge that a copy of this consent can be made available to me via my patient portal [facility] MyChart), or I can request a printed copy by calling the office of [facility].   ? ?I understand that my insurance will be billed for this visit.  ? ?I have read or had this consent read to me. ?I understand the contents of this consent, which adequately explains the benefits and risks of the Services being provided via telemedicine.  ?I have been provided ample opportunity to ask questions regarding this consent and the Services and have had my questions answered to my satisfaction. ?I give my informed consent for the services to be provided through the use of telemedicine in my medical care ? ? ? ?

## 2021-09-13 NOTE — Telephone Encounter (Signed)
Pt agreeable to plan of care for tele pre op appt 09/14/21 @ 2 pm. Pt asked for me to call her tomorrow to go over the medications as she is at a restaurant right now. Consent has been done though.  ?  ?

## 2021-09-13 NOTE — Telephone Encounter (Signed)
? ?  Pre-operative Risk Assessment  ?  ?Patient Name: [name] [name]  ?DOB: January 02, 1951 ?MRN: [id number]  ? ?  ? ?Request for Surgical Clearance   ? ?Procedure:  blepharoplasty, both upper lids with fat pad ? ?Date of Surgery:  Clearance 09/26/21                              ?   ?Surgeon: Dr Art Buff ?Surgeon's Group or Practice Name:  [facility] ?Phone number:  [phone] ?Fax number:  [phone] ?  ?Type of Clearance Requested:   ?- Pharmacy:  Hold Aspirin 10 days prior to surgery ?  ?Type of Anesthesia:  Not Indicated ?  ?Additional requests/questions:     ? ?Signed, ?Deliah Goody   ?09/13/2021, 9:42 AM   ?

## 2021-09-13 NOTE — Telephone Encounter (Signed)
Her aspirin is not prescribed by cardiology.  Recommendations for holding aspirin will need to come from prescribing provider. ? ?Preoperative team, please contact this patient and set up a phone call appointment for further cardiac evaluation.  Thank you for your help. ? ?Thomasene Ripple. Coulton Schlink NP-C ? ?  ?09/13/2021, 12:50 PM ?[facility] ?[address] ?Office [phone] Fax [phone] ? ?

## 2021-09-14 ENCOUNTER — Ambulatory Visit (INDEPENDENT_AMBULATORY_CARE_PROVIDER_SITE_OTHER): Payer: Medicare Other | Admitting: General Practice

## 2021-09-14 ENCOUNTER — Other Ambulatory Visit: Payer: Self-pay

## 2021-09-14 DIAGNOSIS — Z01810 Encounter for preprocedural cardiovascular examination: Secondary | ICD-10-CM

## 2021-09-14 NOTE — Telephone Encounter (Signed)
I did s/w the pt this morning and have reviewed her medications with her in prep for tele appt today.  ?

## 2021-09-14 NOTE — Telephone Encounter (Signed)
I have s/w the pt this morning and have reviewed medications for the tele appt today.  ?

## 2021-09-14 NOTE — Progress Notes (Signed)
? ?Virtual Visit via Telephone Note  ? ?This visit type was conducted due to national recommendations for restrictions regarding the COVID-19 Pandemic (e.g. social distancing) in an effort to limit this patient's exposure and mitigate transmission in our community.  Due to her co-morbid illnesses, this patient is at least at moderate risk for complications without adequate follow up.  This format is felt to be most appropriate for this patient at this time.  The patient did not have access to video technology/had technical difficulties with video requiring transitioning to audio format only (telephone).  All issues noted in this document were discussed and addressed.  No physical exam could be performed with this format.  Please refer to the patient's chart for her  consent to telehealth for [facility]. ?Evaluation Performed:  Preoperative cardiovascular risk assessment ? ?This visit type was conducted due to national recommendations for restrictions regarding the COVID-19 Pandemic (e.g. social distancing).  This format is felt to be most appropriate for this patient at this time.  All issues noted in this document were discussed and addressed.  No physical exam was performed (except for noted visual exam findings with Video Visits).  Please refer to the patient's chart (MyChart message for video visits and phone note for telephone visits) for the patient's consent to telehealth for [facility]. ?_____________  ? ?Date:  09/14/2021  ? ?Patient ID:  [name], [name] [birth year], MRN [id number] ?Patient Location:  ?Home ?Provider location:   ?Office ? ?Primary Care Provider:  Rodrigo Ran, MD ?Primary Cardiologist:  Little Ishikawa, MD ? ?Chief Complaint  ?  ?71 y.o. y/o female with a h/o chronic diastolic CHF, HTN, who is pending bilateral blepharoplasty, and presents today for telephonic preoperative cardiovascular risk assessment. ? ?Past Medical History  ?  ?Past Medical History:  ?Diagnosis Date  ?  Allergy   ? Arthritis   ? Chronic kidney disease   ? Congestive heart failure (HCC)   ? kidney  ? Fibromyalgia   ? Fibromyalgia   ? Hearing loss of both ears   ? Hypertension   ? Lung nodules   ? Migraines   ? controlled with meds; down to once or twice yearly  ? Osteoporosis   ? ?Past Surgical History:  ?Procedure Laterality Date  ? BACK SURGERY  1986  ? ruptured disc  ? BREAST CYST ASPIRATION Right   ? BREAST SURGERY Right 1973  ? benign tumor  ? CATARACT EXTRACTION Bilateral   ? COLONOSCOPY    ? fibromas removed    ? HYSTEROSCOPY  04/1997  ? with polypectomy  ? TONSILLECTOMY    ? 1960  ? TUBAL LIGATION    ? ? ?Allergies ? ?Allergies  ?Allergen Reactions  ? Citrus Other (See Comments)  ?  MIGRAINES (no citric acid, either)  ? Lactose Intolerance (Gi)   ?  N and V, BP drops  ? Silicon Swelling  ? Sulfa Antibiotics Nausea And Vomiting  ? Escitalopram Oxalate Other (See Comments)  ?  Flu-like symptoms without fever  ? ? ?History of Present Illness  ?  ?[name] [name] is a 71 y.o. female who presents via audio/video conferencing for a telehealth visit today.  Pt was last seen in cardiology clinic on 10/23/2020, by Dr. Bjorn Pippin.  At that time [name] [name] was doing well .  she is now pending bilateral blepharoplasty.  Since his last visit, she remained stable from a cardiac standpoint. ? ?Today she denies chest pain, shortness of breath,  lower extremity edema, fatigue, palpitations, melena, hematuria, hemoptysis, diaphoresis, weakness, presyncope, syncope, orthopnea, and PND. ? ? ? ?Home Medications  ?  ?Prior to Admission medications   ?Medication Sig Start Date End Date Taking? Authorizing Provider  ?acetaminophen (TYLENOL) 500 MG tablet Take 1,000 mg by mouth at bedtime. Takes PRN    [provider]  ?amLODipine (NORVASC) 5 MG tablet Take 2.5 mg by mouth daily.    [provider]  ?aspirin 81 MG chewable tablet Chew 81 mg by mouth at bedtime.    [provider]  ?BIOTIN PO Take 5,000 mg by mouth  daily.    [provider]  ?buPROPion (WELLBUTRIN SR) 150 MG 12 hr tablet Take 150 mg by mouth daily.     [provider]  ?calcium carbonate (OS-CAL) 600 MG TABS Take 600 mg by mouth daily.    [provider]  ?carvedilol (COREG) 12.5 MG tablet Take 1 tablet (12.5 mg total) by mouth 2 (two) times daily with a meal. 11/15/16   Elgergawy, Leana Roe, MD  ?Cinnamon 500 MG TABS Take 500 mg by mouth daily.    [provider]  ?Coenzyme Q10 (CO Q 10) 100 MG CAPS Take 100 mg by mouth 2 (two) times daily.    [provider]  ?cyclobenzaprine (FLEXERIL) 10 MG tablet Take 10 mg by mouth at bedtime.    [provider]  ?diphenhydrAMINE (BENADRYL) 25 mg capsule Take 25 mg by mouth at bedtime as needed.    [provider]  ?docusate sodium (COLACE) 100 MG capsule Take 100 mg by mouth daily.    [provider]  ?fish oil-omega-3 fatty acids 1000 MG capsule Take 1 g by mouth 2 (two) times daily at 10 AM and 5 PM.    [provider]  ?gabapentin (NEURONTIN) 100 MG capsule Take 200 mg by mouth at bedtime.  03/10/15   [provider]  ?Hypromellose (ARTIFICIAL TEARS OP) Place 1 drop into both eyes daily as needed (dry eyes).    [provider]  ?Melatonin ER 5 MG TBCR Take by mouth.    [provider]  ?Multiple Vitamin (MULTIVITAMIN) tablet Take 1 tablet by mouth daily.    [provider]  ?Naproxen Sodium (ALEVE PO) Take by mouth daily as needed.    [provider]  ?pravastatin (PRAVACHOL) 20 MG tablet Take 20 mg by mouth as directed. 5 TIMES WEEKLY    [provider]  ?QUERCETIN PO Take 1 tablet by mouth daily.    [provider]  ?Riboflavin (VITAMIN B-2 PO) Take 1 tablet by mouth daily.    [provider]  ?SUMAtriptan (IMITREX) 50 MG tablet Take 25-50 mg by mouth once as needed for migraines 03/27/16   [provider]  ?Triamcinolone Acetonide (NASACORT AQ NA) Place into  the nose daily as needed.    [provider]  ?TURMERIC PO Take 1 capsule by mouth daily.     [provider]  ?Vitamin E (E 400 BLENDED PO) Take 400 mg by mouth 4 (four) times a week.    [provider]  ?zinc gluconate 50 MG tablet Take 25 mg by mouth daily.    [provider]  ?zolpidem (AMBIEN) 5 MG tablet Take 5 mg by mouth at bedtime as needed for sleep. 03/15/11   [provider]  ? ? ?Physical Exam  ?  ?Vital Signs:  [name] [name] does not have vital signs available for review today. ? ?  Given telephonic nature of communication, physical exam is limited. ?AAOx3. NAD. Normal affect.  Speech and respirations are unlabored. ? ?Accessory Clinical Findings  ?  ?None ? ?Assessment & Plan  ?  ?1.  Preoperative Cardiovascular Risk Assessment: ? ?  ? ?Primary Cardiologist: Little Ishikawa, MD ? ?Chart reviewed as part of pre-operative protocol coverage. Given past medical history and time since last visit, based on ACC/AHA guidelines, [name] [name] would be at acceptable risk for the planned procedure without further cardiovascular testing.  ? ?Patient was advised that if she develops new symptoms prior to surgery to contact our office to arrange a follow-up appointment.  He verbalized understanding. ? ?Her aspirin may be held for 7 days prior to her procedure.  Please resume as soon as hemostasis is achieved. ? ? ? ?COVID-19 Education: ?The signs and symptoms of COVID-19 were discussed with the patient and how to seek care for testing (follow up with PCP or arrange E-visit).  The importance of social distancing was discussed today. ? ?Patient Risk:   ?After full review of this patient's history and clinical status, I feel that he is at least moderate risk for cardiac complications at this time, thus necessitating a telehealth visit sooner than our first available in office visit. ? ?Time:   ?Today, I have spent 5 minutes with the patient with telehealth technology  discussing medical history, symptoms, and management plan.  Prior to her phone visit I spent greater than 10 minutes reviewing her past medical history and medications. ? ? ?Ronney Asters, NP ? ?09/14/2021,

## 2021-09-17 DIAGNOSIS — M5411 Radiculopathy, occipito-atlanto-axial region: Secondary | ICD-10-CM | POA: Diagnosis not present

## 2021-09-17 DIAGNOSIS — M9901 Segmental and somatic dysfunction of cervical region: Secondary | ICD-10-CM | POA: Diagnosis not present

## 2021-09-26 DIAGNOSIS — H02834 Dermatochalasis of left upper eyelid: Secondary | ICD-10-CM | POA: Diagnosis not present

## 2021-09-26 DIAGNOSIS — H57813 Brow ptosis, bilateral: Secondary | ICD-10-CM | POA: Diagnosis not present

## 2021-09-26 DIAGNOSIS — H57811 Brow ptosis, right: Secondary | ICD-10-CM | POA: Diagnosis not present

## 2021-09-26 DIAGNOSIS — H02831 Dermatochalasis of right upper eyelid: Secondary | ICD-10-CM | POA: Diagnosis not present

## 2021-10-08 DIAGNOSIS — M5411 Radiculopathy, occipito-atlanto-axial region: Secondary | ICD-10-CM | POA: Diagnosis not present

## 2021-10-08 DIAGNOSIS — M9901 Segmental and somatic dysfunction of cervical region: Secondary | ICD-10-CM | POA: Diagnosis not present

## 2021-10-29 DIAGNOSIS — M9901 Segmental and somatic dysfunction of cervical region: Secondary | ICD-10-CM | POA: Diagnosis not present

## 2021-10-29 DIAGNOSIS — M5411 Radiculopathy, occipito-atlanto-axial region: Secondary | ICD-10-CM | POA: Diagnosis not present

## 2021-11-22 DIAGNOSIS — M5411 Radiculopathy, occipito-atlanto-axial region: Secondary | ICD-10-CM | POA: Diagnosis not present

## 2021-11-22 DIAGNOSIS — M9901 Segmental and somatic dysfunction of cervical region: Secondary | ICD-10-CM | POA: Diagnosis not present

## 2021-12-17 DIAGNOSIS — M5411 Radiculopathy, occipito-atlanto-axial region: Secondary | ICD-10-CM | POA: Diagnosis not present

## 2021-12-17 DIAGNOSIS — M9901 Segmental and somatic dysfunction of cervical region: Secondary | ICD-10-CM | POA: Diagnosis not present

## 2022-01-07 DIAGNOSIS — M9901 Segmental and somatic dysfunction of cervical region: Secondary | ICD-10-CM | POA: Diagnosis not present

## 2022-01-07 DIAGNOSIS — Z79899 Other long term (current) drug therapy: Secondary | ICD-10-CM | POA: Diagnosis not present

## 2022-01-07 DIAGNOSIS — R7989 Other specified abnormal findings of blood chemistry: Secondary | ICD-10-CM | POA: Diagnosis not present

## 2022-01-07 DIAGNOSIS — M5411 Radiculopathy, occipito-atlanto-axial region: Secondary | ICD-10-CM | POA: Diagnosis not present

## 2022-01-07 DIAGNOSIS — E785 Hyperlipidemia, unspecified: Secondary | ICD-10-CM | POA: Diagnosis not present

## 2022-01-07 DIAGNOSIS — I10 Essential (primary) hypertension: Secondary | ICD-10-CM | POA: Diagnosis not present

## 2022-01-07 DIAGNOSIS — M810 Age-related osteoporosis without current pathological fracture: Secondary | ICD-10-CM | POA: Diagnosis not present

## 2022-01-07 DIAGNOSIS — R5383 Other fatigue: Secondary | ICD-10-CM | POA: Diagnosis not present

## 2022-01-07 DIAGNOSIS — R7301 Impaired fasting glucose: Secondary | ICD-10-CM | POA: Diagnosis not present

## 2022-01-14 DIAGNOSIS — R918 Other nonspecific abnormal finding of lung field: Secondary | ICD-10-CM | POA: Diagnosis not present

## 2022-01-14 DIAGNOSIS — Z1331 Encounter for screening for depression: Secondary | ICD-10-CM | POA: Diagnosis not present

## 2022-01-14 DIAGNOSIS — I10 Essential (primary) hypertension: Secondary | ICD-10-CM | POA: Diagnosis not present

## 2022-01-14 DIAGNOSIS — N183 Chronic kidney disease, stage 3 unspecified: Secondary | ICD-10-CM | POA: Diagnosis not present

## 2022-01-14 DIAGNOSIS — R82998 Other abnormal findings in urine: Secondary | ICD-10-CM | POA: Diagnosis not present

## 2022-01-14 DIAGNOSIS — E785 Hyperlipidemia, unspecified: Secondary | ICD-10-CM | POA: Diagnosis not present

## 2022-01-14 DIAGNOSIS — I509 Heart failure, unspecified: Secondary | ICD-10-CM | POA: Diagnosis not present

## 2022-01-14 DIAGNOSIS — Z0000 Encounter for general adult medical examination without abnormal findings: Secondary | ICD-10-CM | POA: Diagnosis not present

## 2022-01-14 DIAGNOSIS — I2510 Atherosclerotic heart disease of native coronary artery without angina pectoris: Secondary | ICD-10-CM | POA: Diagnosis not present

## 2022-01-14 DIAGNOSIS — M810 Age-related osteoporosis without current pathological fracture: Secondary | ICD-10-CM | POA: Diagnosis not present

## 2022-01-14 DIAGNOSIS — R0602 Shortness of breath: Secondary | ICD-10-CM | POA: Diagnosis not present

## 2022-01-14 DIAGNOSIS — I130 Hypertensive heart and chronic kidney disease with heart failure and stage 1 through stage 4 chronic kidney disease, or unspecified chronic kidney disease: Secondary | ICD-10-CM | POA: Diagnosis not present

## 2022-01-14 DIAGNOSIS — I700 Atherosclerosis of aorta: Secondary | ICD-10-CM | POA: Diagnosis not present

## 2022-01-14 DIAGNOSIS — Z1339 Encounter for screening examination for other mental health and behavioral disorders: Secondary | ICD-10-CM | POA: Diagnosis not present

## 2022-01-14 DIAGNOSIS — K219 Gastro-esophageal reflux disease without esophagitis: Secondary | ICD-10-CM | POA: Diagnosis not present

## 2022-01-28 DIAGNOSIS — M5411 Radiculopathy, occipito-atlanto-axial region: Secondary | ICD-10-CM | POA: Diagnosis not present

## 2022-01-28 DIAGNOSIS — M9901 Segmental and somatic dysfunction of cervical region: Secondary | ICD-10-CM | POA: Diagnosis not present

## 2022-02-18 DIAGNOSIS — M5411 Radiculopathy, occipito-atlanto-axial region: Secondary | ICD-10-CM | POA: Diagnosis not present

## 2022-02-18 DIAGNOSIS — M9901 Segmental and somatic dysfunction of cervical region: Secondary | ICD-10-CM | POA: Diagnosis not present

## 2022-03-04 DIAGNOSIS — M9901 Segmental and somatic dysfunction of cervical region: Secondary | ICD-10-CM | POA: Diagnosis not present

## 2022-03-04 DIAGNOSIS — M5411 Radiculopathy, occipito-atlanto-axial region: Secondary | ICD-10-CM | POA: Diagnosis not present

## 2022-03-20 DIAGNOSIS — I509 Heart failure, unspecified: Secondary | ICD-10-CM | POA: Diagnosis not present

## 2022-03-20 DIAGNOSIS — I130 Hypertensive heart and chronic kidney disease with heart failure and stage 1 through stage 4 chronic kidney disease, or unspecified chronic kidney disease: Secondary | ICD-10-CM | POA: Diagnosis not present

## 2022-03-21 ENCOUNTER — Telehealth: Payer: Self-pay | Admitting: Cardiology

## 2022-03-21 NOTE — Telephone Encounter (Signed)
Pt c/o BP issue:  1. What are your last 5 BP readings? 201/98, last night 184/84, today it was 155/76  2. Are you having any other symptoms (ex. Dizziness, headache, blurred vision, passed out)?  Dizzy and nausea, felt like she was going to pass out on Tuesday when it was 201/98  3. What is your medication issue? Real high blood pressure- wanted to be seen- I made an appointment for tomorrow with Hal- please call to e

## 2022-03-21 NOTE — Telephone Encounter (Signed)
Patient stated her BP has been elevated. At 10:30 today 144/74; at 1:30 183/88, P 74. Slight headache, but has h/o migraines. Will request blood work and office visit from PCP yesterday. Appointment tomorrow with Janan Ridge.

## 2022-03-22 ENCOUNTER — Encounter: Payer: Self-pay | Admitting: Physician Assistant

## 2022-03-22 ENCOUNTER — Ambulatory Visit: Payer: Medicare Other | Attending: Physician Assistant | Admitting: Physician Assistant

## 2022-03-22 VITALS — BP 115/72 | HR 76 | Ht 60.0 in | Wt 119.8 lb

## 2022-03-22 DIAGNOSIS — I998 Other disorder of circulatory system: Secondary | ICD-10-CM

## 2022-03-22 DIAGNOSIS — I10 Essential (primary) hypertension: Secondary | ICD-10-CM | POA: Diagnosis not present

## 2022-03-22 DIAGNOSIS — I428 Other cardiomyopathies: Secondary | ICD-10-CM

## 2022-03-22 NOTE — Telephone Encounter (Signed)
Copy of blood work and office visit from Dr. Joylene Draft handed to [name] [name]. A copy sent to HIM to be scanned to chart.

## 2022-03-22 NOTE — Patient Instructions (Addendum)
Medication Instructions:  Increase Amlodipine 5 mg daily   *If you need a refill on your cardiac medications before your next appointment, please call your pharmacy*   Lab Work: NONE ordered at this time of appointment   If you have labs (blood work) drawn today and your tests are completely normal, you will receive your results only by: [place] (if you have MyChart) OR A paper copy in the mail If you have any lab test that is abnormal or we need to change your treatment, we will call you to review the results.   Testing/Procedures: Renal Arterial Doppler    Follow-Up: At [facility], you and your health needs are our priority.  As part of our continuing mission to provide you with exceptional heart care, we have created designated Provider Care Teams.  These Care Teams include your primary Cardiologist (physician) and Advanced Practice Providers (APPs -  Physician Assistants and Nurse Practitioners) who all work together to provide you with the care you need, when you need it.  We recommend signing up for the patient portal called "MyChart".  Sign up information is provided on this After Visit Summary.  MyChart is used to connect with patients for Virtual Visits (Telemedicine).  Patients are able to view lab/test results, encounter notes, upcoming appointments, etc.  Non-urgent messages can be sent to your provider as well.   To learn more about what you can do with MyChart, go to [email or web address].    Your next appointmweek(s)ent:   2-3 weeks  The format for your next appointment:   In Person  Provider:   Almyra Deforest, PA-C        Other Instructions Monitor blood pressure. Please take blood pressure twice daily as directed. Please bring all medications taken to your next office visit.   Important Information About Sugar

## 2022-03-22 NOTE — Progress Notes (Unsigned)
Cardiology Office Note:    Date:  03/23/2022   ID:  [name], DOB June 19, 1951, MRN [id number]  PCP:  Crist Infante, [place] Providers Cardiologist:  Donato Heinz, MD     Referring MD: Crist Infante, MD   Chief Complaint  Patient presents with   Follow-up    Seen for Dr. Gardiner Rhyme    History of Present Illness:    [name] [name] is a 71 y.o. female with a hx of systolic heart failure with improved EF, chronic diastolic heart failure, hypertension, myalgia and tobacco use.  Patient underwent cardiac catheterization in 2016 that showed normal coronary arteries.  Stress echocardiogram in 2014 was normal.  Echocardiogram in 2017 showed EF 55%, grade 1 DD.  Patient was previously followed by Dr. Suezanne Jacquet small of advanced heart failure clinic however has referred to general cardiology service given resolution of her systolic heart failure.  She was last seen by Dr. Nechama Guard on 10/23/2020 at which time she had some lightheadedness that she attributed to her lactose intolerance.  However she was quite active and has no exertional symptoms.  Repeat echocardiogram obtained on 10/18/2020 showed EF 55 to 60%, grade 1 DD, trivial MR, trivial AI.  More recently, patient was seen virtually for preoperative clearance prior to eyelid surgery in March.  She was doing well at the time.  Patient presents today for evaluation of elevated blood pressure associated with dizziness, headache and blurry vision.  She was seen by her PCP on 03/20/2022.  She reported feeling of passing out while out bowling.  She took her blood pressure at a bowling alley and was in the 198/98.  After she went home it was 184/84.  She is on carvedilol and amlodipine.  She was instructed to keep herself hydrated.  And check blood pressure twice a day for the next several days.  Base metabolic panel obtained on 03/13/2022 showed creatinine 0.8, sodium 142, potassium 3.8, glucose 130.  When she saw Dr. Joylene Draft of her PCP, her  blood pressure was 142/82.  Patient presents today for follow-up.  She says Dr. Haynes Kerns has restarted her on the previous dose of Lasix.  So far she only took half a tablet of the Lasix yesterday.  Her blood pressure is remaining elevated today.  Right arm pressure was 174/88, left arm pressure was 162/88.  I decided to increase amlodipine to 5 mg daily.  Interestingly, her systolic blood pressure was in the 140s just a month ago.  She has great functional ability and in fact, she just took a bus trip to [facility] earlier this month and has been able to ambulate for long distance without any exertional chest pain or worsening dyspnea.  She does not look volume overloaded.  Her lung is clear.  EKG is unchanged.  I recommended renal artery Doppler to rule out renal artery stenosis.  I recommended twice a day blood pressure monitoring and recording.  If she will follow-up in 2 to 3 weeks.  Past Medical History:  Diagnosis Date   Allergy    Arthritis    Chronic kidney disease    Congestive heart failure (HCC)    kidney   Fibromyalgia    Fibromyalgia    Hearing loss of both ears    Hypertension    Lung nodules    Migraines    controlled with meds; down to once or twice yearly   Osteoporosis     Past Surgical History:  Procedure Laterality Date   BACK SURGERY  1986   ruptured disc   BREAST CYST ASPIRATION Right    BREAST SURGERY Right 1973   benign tumor   CATARACT EXTRACTION Bilateral    COLONOSCOPY     fibromas removed     HYSTEROSCOPY  04/1997   with polypectomy   TONSILLECTOMY     1960   TUBAL LIGATION      Current Medications: Current Meds  Medication Sig   acetaminophen (TYLENOL) 500 MG tablet Take 1,000 mg by mouth at bedtime. Takes PRN   amLODipine (NORVASC) 5 MG tablet Take 5 mg by mouth daily.   aspirin 81 MG chewable tablet Chew 81 mg by mouth at bedtime.   BIOTIN PO Take 5,000 mg by mouth daily.   buPROPion (WELLBUTRIN SR) 150 MG 12 hr tablet Take 150 mg by mouth  daily.    calcium carbonate (OS-CAL) 600 MG TABS Take 600 mg by mouth daily.   carvedilol (COREG) 12.5 MG tablet Take 1 tablet (12.5 mg total) by mouth 2 (two) times daily with a meal.   Cinnamon 500 MG TABS Take 500 mg by mouth daily.   Coenzyme Q10 (CO Q 10) 100 MG CAPS Take 100 mg by mouth 2 (two) times daily.   cyclobenzaprine (FLEXERIL) 10 MG tablet Take 10 mg by mouth at bedtime.   diphenhydrAMINE (BENADRYL) 25 mg capsule Take 25 mg by mouth at bedtime as needed.   docusate sodium (COLACE) 100 MG capsule Take 100 mg by mouth daily.   fish oil-omega-3 fatty acids 1000 MG capsule Take 1 g by mouth 2 (two) times daily at 10 AM and 5 PM.   gabapentin (NEURONTIN) 100 MG capsule Take 200 mg by mouth at bedtime.    Hypromellose (ARTIFICIAL TEARS OP) Place 1 drop into both eyes daily as needed (dry eyes).   Melatonin ER 5 MG TBCR Take by mouth.   Multiple Vitamin (MULTIVITAMIN) tablet Take 1 tablet by mouth daily.   Naproxen Sodium (ALEVE PO) Take by mouth daily as needed.   pravastatin (PRAVACHOL) 20 MG tablet Take 20 mg by mouth as directed. 5 TIMES WEEKLY   Riboflavin (VITAMIN B-2 PO) Take 1 tablet by mouth daily.   SUMAtriptan (IMITREX) 50 MG tablet Take 25-50 mg by mouth once as needed for migraines   Triamcinolone Acetonide (NASACORT AQ NA) Place into the nose daily as needed.   TURMERIC PO Take 1 capsule by mouth daily.    Vitamin E (E 400 BLENDED PO) Take 400 mg by mouth 4 (four) times a week.   zolpidem (AMBIEN) 5 MG tablet Take 5 mg by mouth at bedtime as needed for sleep.     Allergies:   Citrus, Lactose intolerance (gi), Silicon, Sulfa antibiotics, and Escitalopram oxalate   Social History   Socioeconomic History   Marital status: Widowed    Spouse name: Not on file   Number of children: 3   Years of education: Not on file   Highest education level: High school graduate  Occupational History   Occupation: Press photographer REP    Employer: [employer]  Tobacco Use    Smoking status: Former    Packs/day: 0.30    Types: Cigarettes    Quit date: 12/20/2014    Years since quitting: 7.2   Smokeless tobacco: Never   Tobacco comments:    2 packs per week  Vaping Use   Vaping Use: Never used  Substance and Sexual Activity   Alcohol use:  No   Drug use: No   Sexual activity: Not Currently    Partners: Male    Birth control/protection: Post-menopausal  Other Topics Concern   Not on file  Social History Narrative   Lives at home with her husband   Left handed   Drinks about 2 cups of caffeine daily   Social Determinants of Health   Financial Resource Strain: Not on file  Food Insecurity: Not on file  Transportation Needs: Not on file  Physical Activity: Not on file  Stress: Not on file  Social Connections: Not on file     Family History: The patient's family history includes Hypertension in her father and mother. There is no history of Colon cancer, Esophageal cancer, Stomach cancer, or Rectal cancer.  ROS:   Please see the history of present illness.     All other systems reviewed and are negative.  EKGs/Labs/Other Studies Reviewed:    The following studies were reviewed today:  Echo 10/18/2020  1. Left ventricular ejection fraction, by estimation, is 55 to 60%. The  left ventricle has normal function. The left ventricle has no regional  wall motion abnormalities. Left ventricular diastolic parameters are  consistent with Grade I diastolic  dysfunction (impaired relaxation).   2. Right ventricular systolic function is normal. The right ventricular  size is normal.   3. The mitral valve is normal in structure. Trivial mitral valve  regurgitation. No evidence of mitral stenosis.   4. The aortic valve is tricuspid. Aortic valve regurgitation is trivial.  No aortic stenosis is present.   5. The inferior vena cava is normal in size with greater than 50%  respiratory variability, suggesting right atrial pressure of 3 mmHg.   EKG:  EKG is  ordered today.  The ekg ordered today demonstrates normal sinus rhythm, no significant ST-T wave changes.  T wave flattening from V3-V6.  Unchanged when compared to the previous EKG.  Recent Labs: No results found for requested labs within last 365 days.  Recent Lipid Panel    Component Value Date/Time   CHOL 278 10/21/2008 0000   TRIG 110 10/21/2008 0000   HDL 49 10/21/2008 0000   LDLCALC 207 10/21/2008 0000     Risk Assessment/Calculations:           Physical Exam:    VS:  BP 115/72   Pulse 76   Ht 5' (1.524 m)   Wt 119 lb 12.8 oz (54.3 kg)   LMP 01/22/2001 (Approximate)   SpO2 99%   BMI 23.40 kg/m         Wt Readings from Last 3 Encounters:  03/22/22 119 lb 12.8 oz (54.3 kg)  04/30/21 115 lb (52.2 kg)  04/23/21 115 lb (52.2 kg)     GEN:  Well nourished, well developed in no acute distress HEENT: Normal NECK: No JVD; No carotid bruits LYMPHATICS: No lymphadenopathy CARDIAC: RRR, no murmurs, rubs, gallops RESPIRATORY:  Clear to auscultation without rales, wheezing or rhonchi  ABDOMEN: Soft, non-tender, non-distended MUSCULOSKELETAL:  No edema; No deformity  SKIN: Warm and dry NEUROLOGIC:  Alert and oriented x 3 PSYCHIATRIC:  Normal affect   ASSESSMENT:    1. Uncontrolled hypertension   2. NICM (nonischemic cardiomyopathy) ([place])    PLAN:    In order of problems listed above:  Uncontrolled hypertension: Initial blood pressure was inaccurate.  I checked her blood pressure manually, her right arm pressure was 174/88, left arm pressure was 162/88.  I increased her amlodipine to 5 mg  daily.  Upper and renal artery Doppler  Nonischemic cardiomyopathy: She had a remote history of nonischemic cardiomyopathy, EF has improved.           Medication Adjustments/Labs and Tests Ordered: Current medicines are reviewed at length with the patient today.  Concerns regarding medicines are outlined above.  Orders Placed This Encounter  Procedures   EKG 12-Lead    VAS US RENAL ARTERY DUPLEX   No orders of the defined types were placed in this encounter.   Patient Instructions  Medication Instructions:  Increase Amlodipine 5 mg daily   *If you need a refill on your cardiac medications before your next appointment, please call your pharmacy*   Lab Work: NONE ordered at this time of appointment   If you have labs (blood work) drawn today and your tests are completely normal, you will receive your results only by: [place] (if you have MyChart) OR A paper copy in the mail If you have any lab test that is abnormal or we need to change your treatment, we will call you to review the results.   Testing/Procedures: Renal Arterial Doppler    Follow-Up: At [facility], you and your health needs are our priority.  As part of our continuing mission to provide you with exceptional heart care, we have created designated Provider Care Teams.  These Care Teams include your primary Cardiologist (physician) and Advanced Practice Providers (APPs -  Physician Assistants and Nurse Practitioners) who all work together to provide you with the care you need, when you need it.  We recommend signing up for the patient portal called "MyChart".  Sign up information is provided on this After Visit Summary.  MyChart is used to connect with patients for Virtual Visits (Telemedicine).  Patients are able to view lab/test results, encounter notes, upcoming appointments, etc.  Non-urgent messages can be sent to your provider as well.   To learn more about what you can do with MyChart, go to [email or web address].    Your next appointmweek(s)ent:   2-3 weeks  The format for your next appointment:   In Person  Provider:   Almyra Deforest, PA-C        Other Instructions Monitor blood pressure. Please take blood pressure twice daily as directed. Please bring all medications taken to your next office visit.   Important Information About Sugar          Hilbert Corrigan, [place]  03/23/2022 11:30 PM    [place]

## 2022-03-23 ENCOUNTER — Encounter: Payer: Self-pay | Admitting: Physician Assistant

## 2022-03-25 DIAGNOSIS — M5411 Radiculopathy, occipito-atlanto-axial region: Secondary | ICD-10-CM | POA: Diagnosis not present

## 2022-03-25 DIAGNOSIS — M9901 Segmental and somatic dysfunction of cervical region: Secondary | ICD-10-CM | POA: Diagnosis not present

## 2022-04-01 ENCOUNTER — Ambulatory Visit (HOSPITAL_COMMUNITY)
Admission: RE | Admit: 2022-04-01 | Discharge: 2022-04-01 | Disposition: A | Discharge status: Home / Self Care | Payer: Medicare Other | Source: Ambulatory Visit | Attending: Physician Assistant | Admitting: Physician Assistant

## 2022-04-01 DIAGNOSIS — I10 Essential (primary) hypertension: Secondary | ICD-10-CM | POA: Insufficient documentation

## 2022-04-08 DIAGNOSIS — M5411 Radiculopathy, occipito-atlanto-axial region: Secondary | ICD-10-CM | POA: Diagnosis not present

## 2022-04-08 DIAGNOSIS — M9901 Segmental and somatic dysfunction of cervical region: Secondary | ICD-10-CM | POA: Diagnosis not present

## 2022-04-10 ENCOUNTER — Ambulatory Visit: Payer: Medicare Other | Attending: Physician Assistant | Admitting: Nurse Practitioner

## 2022-04-10 ENCOUNTER — Other Ambulatory Visit: Payer: Self-pay | Admitting: Nurse Practitioner

## 2022-04-10 ENCOUNTER — Other Ambulatory Visit: Payer: Self-pay

## 2022-04-10 ENCOUNTER — Encounter: Payer: Self-pay | Admitting: Physician Assistant

## 2022-04-10 VITALS — BP 130/79 | HR 93 | Ht 60.0 in | Wt 122.4 lb

## 2022-04-10 DIAGNOSIS — Z87898 Personal history of other specified conditions: Secondary | ICD-10-CM | POA: Insufficient documentation

## 2022-04-10 DIAGNOSIS — I10 Essential (primary) hypertension: Secondary | ICD-10-CM | POA: Insufficient documentation

## 2022-04-10 DIAGNOSIS — I428 Other cardiomyopathies: Secondary | ICD-10-CM | POA: Insufficient documentation

## 2022-04-10 MED ORDER — AMLODIPINE BESYLATE 5 MG PO TABS: 7.5000 mg | ORAL_TABLET | Freq: Every day | ORAL | 3 refills | Status: DC

## 2022-04-10 MED ORDER — AMLODIPINE BESYLATE 5 MG PO TABS: 7.5000 mg | ORAL_TABLET | Freq: Every day | ORAL | 0 refills | Status: DC

## 2022-04-10 NOTE — Progress Notes (Signed)
Cardiology Office Note:    Date:  04/10/2022   ID:  [name], DOB [birth year], MRN [id number]  PCP:  Crist Infante, [facility] Cardiologist:  Donato Heinz, MD     Referring MD: Crist Infante, MD   CC: Here for follow-up and elevated BP  History of Present Illness:    [name] [name] is a 71 y.o. female with a hx of the following:  HTN Nonischemic CM CHF (systolic CHF with improved EF) Chronic diastolic CHF Fibromyalgia Dyspnea Hx of syncope  Underwent cardiac cath in 2016 that revealed normal coronary arteries.  Stress echo in 2014 normal.  Echo in 2017 showed EF 24%, grade 1 diastolic dysfunction.  She was previously followed by the heart failure clinic but was referred to general cardiology given resolution of systolic heart failure.  Seen by Dr. Nechama Guard in 2022, at that time she noted lightheadedness that she attributed to her lactose intolerance.  Repeat echo in April 2022 revealed EF 55 to 60%, trivial MR, trivial AI, grade 1 DD.  Last seen by Almyra Deforest, PA on March 22, 2022 with chief complaint of elevated blood pressure, dizziness, headache, blurry vision.  She was seen by her PCP earlier that week, she reported feeling close to passing out while bowling, BP at bowling alley was 198/98.  After she went home, she rechecked her BP and it was 184/84.  When she saw her PCP, BP was 142/82.  Her PCP restart her previous dose of Lasix.  In the office that day her right arm BP was 174/88, left arm BP was 162/88.  Amlodipine was increased.  She continue to remain active.  Renal artery Doppler was recommended to rule out renal artery stenosis due to elevated BP that did not reveal evidence of RAS. She was advised to follow-up in 2 to 3 weeks in the office.  Today she presents for follow-up.  She presents her BP log that reveals labile HTN, averaging from 130's to 160s and one elevated reading of 180. States she takes her amlodipine at 11 PM and checks her  BP at 10:30 PM which explains why evening readings are elevated. She continues to remain very active. She participates in bowling and is a member of a hiking club. Overall she is doing very well from a cardiac perspective. She denies any CP, SHOB, palpitations, syncope, presyncope, dizziness, lightheadedness, orthopnea, PND, bleeding, or claudication. She denies any other questions or concerns today.   Past Medical History:  Diagnosis Date   Allergy    Arthritis    Chronic kidney disease    Congestive heart failure (HCC)    kidney   Fibromyalgia    Fibromyalgia    Hearing loss of both ears    Hypertension    Lung nodules    Migraines    controlled with meds; down to once or twice yearly   Osteoporosis     Past Surgical History:  Procedure Laterality Date   BACK SURGERY  1986   ruptured disc   BREAST CYST ASPIRATION Right    BREAST SURGERY Right 1973   benign tumor   CATARACT EXTRACTION Bilateral    COLONOSCOPY     fibromas removed     HYSTEROSCOPY  04/1997   with polypectomy   TONSILLECTOMY     1960   TUBAL LIGATION      Current Medications: Current Meds  Medication Sig   acetaminophen (TYLENOL) 500 MG tablet Take 1,000 mg by  mouth at bedtime. Takes PRN   aspirin 81 MG chewable tablet Chew 81 mg by mouth at bedtime.   BIOTIN PO Take 5,000 mg by mouth daily.   buPROPion (WELLBUTRIN SR) 150 MG 12 hr tablet Take 150 mg by mouth daily.    calcium carbonate (OS-CAL) 600 MG TABS Take 600 mg by mouth daily.   carvedilol (COREG) 12.5 MG tablet Take 1 tablet (12.5 mg total) by mouth 2 (two) times daily with a meal.   Cinnamon 500 MG TABS Take 500 mg by mouth daily.   Coenzyme Q10 (CO Q 10) 100 MG CAPS Take 100 mg by mouth 2 (two) times daily.   cyclobenzaprine (FLEXERIL) 10 MG tablet Take 10 mg by mouth at bedtime.   diphenhydrAMINE (BENADRYL) 25 mg capsule Take 25 mg by mouth at bedtime as needed.   docusate sodium (COLACE) 100 MG capsule Take 100 mg by mouth daily.   fish  oil-omega-3 fatty acids 1000 MG capsule Take 1 g by mouth 2 (two) times daily at 10 AM and 5 PM.   gabapentin (NEURONTIN) 100 MG capsule Take 200 mg by mouth at bedtime.    Hypromellose (ARTIFICIAL TEARS OP) Place 1 drop into both eyes daily as needed (dry eyes).   Melatonin ER 5 MG TBCR Take by mouth.   Multiple Vitamin (MULTIVITAMIN) tablet Take 1 tablet by mouth daily.   Naproxen Sodium (ALEVE PO) Take by mouth daily as needed.   pravastatin (PRAVACHOL) 20 MG tablet Take 20 mg by mouth as directed. 5 TIMES WEEKLY   Riboflavin (VITAMIN B-2 PO) Take 1 tablet by mouth daily.   SUMAtriptan (IMITREX) 50 MG tablet Take 25-50 mg by mouth once as needed for migraines   Triamcinolone Acetonide (NASACORT AQ NA) Place into the nose daily as needed.   TURMERIC PO Take 1 capsule by mouth daily.    Vitamin E (E 400 BLENDED PO) Take 400 mg by mouth 4 (four) times a week.   zolpidem (AMBIEN) 5 MG tablet Take 5 mg by mouth at bedtime as needed for sleep.   amLODipine (NORVASC) 5 MG tablet Take 5 mg by mouth daily.     Allergies:   Citrus, Lactose intolerance (gi), Silicon, Sulfa antibiotics, and Escitalopram oxalate   Social History   Socioeconomic History   Marital status: Widowed    Spouse name: Not on file   Number of children: 3   Years of education: Not on file   Highest education level: High school graduate  Occupational History   Occupation: Press photographer REP    Employer: [employer]  Tobacco Use   Smoking status: Former    Packs/day: 0.30    Types: Cigarettes    Quit date: 12/20/2014    Years since quitting: 7.3   Smokeless tobacco: Never   Tobacco comments:    2 packs per week  Vaping Use   Vaping Use: Never used  Substance and Sexual Activity   Alcohol use: No   Drug use: No   Sexual activity: Not Currently    Partners: Male    Birth control/protection: Post-menopausal  Other Topics Concern   Not on file  Social History Narrative   Lives at home with her husband   Left  handed   Drinks about 2 cups of caffeine daily   Social Determinants of Health   Financial Resource Strain: Not on file  Food Insecurity: Not on file  Transportation Needs: Not on file  Physical Activity: Not on file  Stress:  Not on file  Social Connections: Not on file     Family History: The patient's family history includes Hypertension in her father and mother. There is no history of Colon cancer, Esophageal cancer, Stomach cancer, or Rectal cancer.  ROS:   Review of Systems  Constitutional: Negative.   HENT: Negative.    Eyes: Negative.   Respiratory: Negative.    Cardiovascular: Negative.   Gastrointestinal: Negative.   Genitourinary: Negative.   Musculoskeletal:  Positive for joint pain. Negative for back pain, falls, myalgias and neck pain.       Left thumb pain from bowling.   Skin: Negative.   Neurological: Negative.   Endo/Heme/Allergies: Negative.   Psychiatric/Behavioral: Negative.     Please see the history of present illness.    All other systems reviewed and are negative.  EKGs/Labs/Other Studies Reviewed:    The following studies were reviewed today:   EKG:  EKG is not ordered today.   Bilateral renal artery duplex on April 01, 2022: Right: Normal size right kidney. Abnormal right Resistive Index. No         evidence of right renal artery stenosis. RRV flow present.         Normal cortical thickness of right kidney.  Left:  Normal size of left kidney. Abnormal left Resistive Index. No         evidence of left renal artery stenosis. LRV flow present.         Normal cortical thickness of the left kidney.  Mesenteric:  Normal Celiac artery and Superior Mesenteric artery findings.  Patent IVC.   2D echocardiogram on October 18, 2020: 1. Left ventricular ejection fraction, by estimation, is 55 to 60%. The  left ventricle has normal function. The left ventricle has no regional  wall motion abnormalities. Left ventricular diastolic parameters are   consistent with Grade I diastolic  dysfunction (impaired relaxation).   2. Right ventricular systolic function is normal. The right ventricular  size is normal.   3. The mitral valve is normal in structure. Trivial mitral valve  regurgitation. No evidence of mitral stenosis.   4. The aortic valve is tricuspid. Aortic valve regurgitation is trivial.  No aortic stenosis is present.   5. The inferior vena cava is normal in size with greater than 50%  respiratory variability, suggesting right atrial pressure of 3 mmHg.   Echo stress test without contrast on December 18, 2012: Normal stress test.  Recent Labs: 2023 most recent labs on [facility] Total chol 186, HDL 68, LDL 100 Scr 0.98  Recent Lipid Panel    Component Value Date/Time   CHOL 278 10/21/2008 0000   TRIG 110 10/21/2008 0000   HDL 49 10/21/2008 0000   LDLCALC 207 10/21/2008 0000        Physical Exam:    VS:  BP 130/79 (BP Location: Right Arm, Patient Position: Sitting, Cuff Size: Normal)   Pulse 93   Ht 5' (1.524 m)   Wt 122 lb 6.4 oz (55.5 kg)   LMP 01/22/2001 (Approximate)   SpO2 99%   BMI 23.90 kg/m     Wt Readings from Last 3 Encounters:  04/10/22 122 lb 6.4 oz (55.5 kg)  03/22/22 119 lb 12.8 oz (54.3 kg)  04/30/21 115 lb (52.2 kg)    Vitals:   04/10/22 1452 04/10/22 1500  BP: (!) 142/78 130/79  Pulse: 93   SpO2: 99%    Manual BP right arm (130/79) Manual BP left arm (130/70)  GEN: Well  nourished, well developed in no acute distress HEENT: Normal NECK: No JVD; No carotid bruits CARDIAC: S1/S2, RRR, no murmurs, rubs, gallops; 2+ peripheral pulses throughout, strong and equal bialterally RESPIRATORY:  Clear to auscultation without rales, wheezing or rhonchi  MUSCULOSKELETAL:  No edema; No deformity  SKIN: Warm and dry NEUROLOGIC:  Alert and oriented x 3 PSYCHIATRIC:  Normal affect   ASSESSMENT:    1. Hypertension, unspecified type   2. NICM (nonischemic cardiomyopathy) ([place])   3. History of syncope     PLAN:    In order of problems listed above:  HTN Labile HTN. Average SBP readings 130s to 160s. Increase amlodipine from 5 mg to 7.5 mg daily, as patient is hesitant to increase amlodipine to 10 mg daily. Discussed to monitor BP at home at least 2 hours after medications and sitting for 5-10 minutes. No RAS on renal artery duplex. BP [id number] systolic bilaterally on exam. Low salt, heart healthy diet recommended. BP log given today and discussed to bring to next OV. Heart healthy diet and regular cardiovascular exercise encouraged. If BP log does not show improved readings at next visit, consider increase in amlodipine or Carvedilol.   Nonischemic CM EF 55-60%. Remote hx of NICM. Euvolemic and well compensated on exam. Update TTE when clinically indicated or signs or symptoms of worsening EF. Continue current medication regimen. Heart healthy diet and regular cardiovascular exercise encouraged.   History of Syncope Previous history of passing out while at audiology office. Denies any recent symptoms. Discussed to monitor BP at home at least 2 hours after medications and sitting for 5-10 minutes as mentioned above. Discussed to notify office if she has any new or worsening symptoms.   4. Disposition: Follow-up with Dr. Gardiner Rhyme in 1 month or sooner if anything changes.    Medication Adjustments/Labs and Tests Ordered: Current medicines are reviewed at length with the patient today.  Concerns regarding medicines are outlined above.  No orders of the defined types were placed in this encounter.  Meds ordered this encounter  Medications   DISCONTD: amLODipine (NORVASC) 5 MG tablet    Sig: Take 1.5 tablets (7.5 mg total) by mouth daily.    Dispense:  135 tablet    Refill:  3    Dose change new Rx    Patient Instructions  Medication Instructions:   INCREASE Amlodipine to 7.5 mg (1.5 tablets) daily  *If you need a refill on your cardiac medications before your next appointment, please call  your pharmacy*  Lab Work: NONE ordered at this time of appointment   If you have labs (blood work) drawn today and your tests are completely normal, you will receive your results only by: MyChart Message (if you have MyChart) OR A paper copy in the mail If you have any lab test that is abnormal or we need to change your treatment, we will call you to review the results.  Testing/Procedures: NONE ordered at this time of appointment   Follow-Up: At [facility], you and your health needs are our priority.  As part of our continuing mission to provide you with exceptional heart care, we have created designated Provider Care Teams.  These Care Teams include your primary Cardiologist (physician) and Advanced Practice Providers (APPs -  Physician Assistants and Nurse Practitioners) who all work together to provide you with the care you need, when you need it.   Your next appointment:   As previously scheduled   The format for your next appointment:  In Person  Provider:   Donato Heinz, MD     Other Instructions   Important Information About Sugar         Signed, Finis Bud, NP  04/10/2022 8:50 PM    [place]

## 2022-04-10 NOTE — Patient Instructions (Addendum)
Medication Instructions:   INCREASE Amlodipine to 7.5 mg (1.5 tablets) daily  *If you need a refill on your cardiac medications before your next appointment, please call your pharmacy*  Lab Work: NONE ordered at this time of appointment   If you have labs (blood work) drawn today and your tests are completely normal, you will receive your results only by: [place] (if you have MyChart) OR A paper copy in the mail If you have any lab test that is abnormal or we need to change your treatment, we will call you to review the results.  Testing/Procedures: NONE ordered at this time of appointment   Follow-Up: At [facility], you and your health needs are our priority.  As part of our continuing mission to provide you with exceptional heart care, we have created designated Provider Care Teams.  These Care Teams include your primary Cardiologist (physician) and Advanced Practice Providers (APPs -  Physician Assistants and Nurse Practitioners) who all work together to provide you with the care you need, when you need it.   Your next appointment:   As previously scheduled   The format for your next appointment:   In Person  Provider:   Donato Heinz, MD     Other Instructions   Important Information About Sugar

## 2022-04-11 ENCOUNTER — Other Ambulatory Visit: Payer: Self-pay

## 2022-04-11 ENCOUNTER — Telehealth: Payer: Self-pay | Admitting: Nurse Practitioner

## 2022-04-11 MED ORDER — AMLODIPINE BESYLATE 5 MG PO TABS: 5.0000 mg | ORAL_TABLET | Freq: Every day | ORAL | 1 refills | Status: DC

## 2022-04-11 MED ORDER — AMLODIPINE BESYLATE 2.5 MG PO TABS: 2.5000 mg | ORAL_TABLET | Freq: Every day | ORAL | 1 refills | Status: DC

## 2022-04-11 NOTE — Telephone Encounter (Signed)
RX sent to pharmacy  

## 2022-04-11 NOTE — Telephone Encounter (Signed)
Are we okay to call in 5 mg and 2.5 mg tablet? Thanks!

## 2022-04-11 NOTE — Telephone Encounter (Signed)
  Pt c/o medication issue:  1. Name of Medication: amLODipine (NORVASC) 5 MG tablet  2. How are you currently taking this medication (dosage and times per day)? Take 1.5 tablets (7.5 mg total) by mouth daily.  3. Are you having a reaction (difficulty breathing--STAT)? No   4. What is your medication issue? Pt said, she has to take amlodipine 7.5 mg a day. She said, the last time she tried to cut the amlodipine in half it crumbles and not getting the exact dose. She is requesting if she can get a 5 mg and 2.5 mg tablet prescription for her amlodipine

## 2022-04-29 DIAGNOSIS — M5411 Radiculopathy, occipito-atlanto-axial region: Secondary | ICD-10-CM | POA: Diagnosis not present

## 2022-04-29 DIAGNOSIS — M9901 Segmental and somatic dysfunction of cervical region: Secondary | ICD-10-CM | POA: Diagnosis not present

## 2022-05-07 NOTE — Progress Notes (Unsigned)
Cardiology Office Note:    Date:  05/09/2022   ID:  LASHARRA [name] [name], DOB 1951-01-26, MRN [id number]  PCP:  Crist Infante, MD  Cardiologist:  Donato Heinz, MD  Electrophysiologist:  None   Referring MD: Crist Infante, MD   Chief Complaint  Patient presents with   Congestive Heart Failure    History of Present Illness:    [name] [name] is a 71 y.o. female with a hx of an ICM with EF 25 to 35% in 2006 with recovery to normal systolic function now with chronic diastolic heart failure, tobacco use, hypertension, fibromyalgia who presents for initial clinic visit.  She previously followed with Dr. Haroldine Laws in the advanced heart failure clinic but was referred to general cardiology given resolution of her systolic heart failure.  Underwent a left heart catheterization in 2006, which showed no coronary disease.  Stress echocardiogram in 2014 was normal.  Echocardiogram 01/2016 showed LVEF [id number], grade 1 diastolic dysfunction.  Echocardiogram 10/18/2020 showed normal biventricular function, grade 1 diastolic dysfunction, no significant valvular disease.  Since last clinic visit, she reports she is doing well.  Denies any chest pain, lightheadedness, syncope, lower extremity edema, or palpitations.  Reports blood pressure has been generally well controlled but occasionally up to 160s (usually 120s to 130s over 60s).  Reports some dyspnea on exertion when she hikes.  She is in a hiking group and will hike every Wednesday for about 1.5 hours.   Past Medical History:  Diagnosis Date   Allergy    Arthritis    Chronic kidney disease    Congestive heart failure (HCC)    kidney   Fibromyalgia    Fibromyalgia    Hearing loss of both ears    History of syncope    Hypertension    Lung nodules    Migraines    controlled with meds; down to once or twice yearly   Osteoporosis     Past Surgical History:  Procedure Laterality Date   BACK SURGERY  1986   ruptured disc   BREAST CYST ASPIRATION  Right    BREAST SURGERY Right 1973   benign tumor   CATARACT EXTRACTION Bilateral    COLONOSCOPY     fibromas removed     HYSTEROSCOPY  04/1997   with polypectomy   TONSILLECTOMY     1960   TUBAL LIGATION      Current Medications: Current Meds  Medication Sig   acetaminophen (TYLENOL) 500 MG tablet Take 1,000 mg by mouth at bedtime. Takes PRN   aspirin 81 MG chewable tablet Chew 81 mg by mouth at bedtime.   BIOTIN PO Take 5,000 mg by mouth daily.   buPROPion (WELLBUTRIN SR) 150 MG 12 hr tablet Take 150 mg by mouth daily.    calcium carbonate (OS-CAL) 600 MG TABS Take 600 mg by mouth daily.   carvedilol (COREG) 12.5 MG tablet Take 1 tablet (12.5 mg total) by mouth 2 (two) times daily with a meal.   Cinnamon 500 MG TABS Take 500 mg by mouth daily.   Coenzyme Q10 (CO Q 10) 100 MG CAPS Take 100 mg by mouth 2 (two) times daily.   cyclobenzaprine (FLEXERIL) 10 MG tablet Take 10 mg by mouth at bedtime.   diphenhydrAMINE (BENADRYL) 25 mg capsule Take 25 mg by mouth at bedtime as needed.   docusate sodium (COLACE) 100 MG capsule Take 100 mg by mouth daily.   ezetimibe (ZETIA) 10 MG tablet Take 1 tablet (10 mg total)  by mouth daily.   fish oil-omega-3 fatty acids 1000 MG capsule Take 1 g by mouth 2 (two) times daily at 10 AM and 5 PM.   gabapentin (NEURONTIN) 100 MG capsule Take 200 mg by mouth at bedtime.    Hypromellose (ARTIFICIAL TEARS OP) Place 1 drop into both eyes daily as needed (dry eyes).   Melatonin ER 5 MG TBCR Take by mouth.   Multiple Vitamin (MULTIVITAMIN) tablet Take 1 tablet by mouth daily.   Naproxen Sodium (ALEVE PO) Take by mouth daily as needed.   pravastatin (PRAVACHOL) 20 MG tablet Take 20 mg by mouth as directed. 5 TIMES WEEKLY   Riboflavin (VITAMIN B-2 PO) Take 1 tablet by mouth daily.   SUMAtriptan (IMITREX) 50 MG tablet Take 25-50 mg by mouth once as needed for migraines   Triamcinolone Acetonide (NASACORT AQ NA) Place into the nose daily as needed.   TURMERIC  PO Take 1 capsule by mouth daily.    Vitamin E (E 400 BLENDED PO) Take 400 mg by mouth 4 (four) times a week.   zolpidem (AMBIEN) 5 MG tablet Take 5 mg by mouth at bedtime as needed for sleep.   [DISCONTINUED] amLODipine (NORVASC) 2.5 MG tablet Take 1 tablet (2.5 mg total) by mouth daily. Take along with 5 mg (equal 7.5 mg)   [DISCONTINUED] amLODipine (NORVASC) 5 MG tablet Take 1 tablet (5 mg total) by mouth daily. Take along with 2.5 mg equal 7.5 mg     Allergies:   Citrus, Lactose intolerance (gi), Silicon, Sulfa antibiotics, and Escitalopram oxalate   Social History   Socioeconomic History   Marital status: Widowed    Spouse name: Not on file   Number of children: 3   Years of education: Not on file   Highest education level: High school graduate  Occupational History   Occupation: Press photographer REP    Employer: [employer]  Tobacco Use   Smoking status: Former    Packs/day: 0.30    Types: Cigarettes    Quit date: 12/20/2014    Years since quitting: 7.3   Smokeless tobacco: Never   Tobacco comments:    2 packs per week  Vaping Use   Vaping Use: Never used  Substance and Sexual Activity   Alcohol use: No   Drug use: No   Sexual activity: Not Currently    Partners: Male    Birth control/protection: Post-menopausal  Other Topics Concern   Not on file  Social History Narrative   Lives at home with her husband   Left handed   Drinks about 2 cups of caffeine daily   Social Determinants of Health   Financial Resource Strain: Not on file  Food Insecurity: Not on file  Transportation Needs: Not on file  Physical Activity: Not on file  Stress: Not on file  Social Connections: Not on file     Family History: The patient's family history includes Hypertension in her father and mother. There is no history of Colon cancer, Esophageal cancer, Stomach cancer, or Rectal cancer.  ROS:   Please see the history of present illness. (+)  Lightheadedness (+) Shortness of  breath All other systems reviewed and are negative.  EKGs/Labs/Other Studies Reviewed:    The following studies were reviewed today:   EKG:   10/23/2020: EKG is not ordered today.    Recent Labs: No results found for requested labs within last 365 days.  Recent Lipid Panel    Component Value Date/Time   CHOL  278 10/21/2008 0000   TRIG 110 10/21/2008 0000   HDL 49 10/21/2008 0000   LDLCALC 207 10/21/2008 0000    Physical Exam:    VS:  BP 132/66   Pulse 90   Ht 5' (1.524 m)   Wt 124 lb (56.2 kg)   LMP 01/22/2001 (Approximate)   SpO2 98%   BMI 24.22 kg/m     Wt Readings from Last 3 Encounters:  05/09/22 124 lb (56.2 kg)  04/10/22 122 lb 6.4 oz (55.5 kg)  03/22/22 119 lb 12.8 oz (54.3 kg)     GEN: Well nourished, well developed in no acute distress HEENT: Normal NECK: No JVD; No carotid bruits LYMPHATICS: No lymphadenopathy CARDIAC: RRR, no murmurs, rubs, gallops RESPIRATORY:  Clear to auscultation without rales, wheezing or rhonchi  ABDOMEN: Soft, non-tender, non-distended MUSCULOSKELETAL:  No edema; No deformity  SKIN: Warm and dry NEUROLOGIC:  Alert and oriented x 3 PSYCHIATRIC:  Normal affect   ASSESSMENT:    1. NICM (nonischemic cardiomyopathy) (HCC)   2. Chronic diastolic CHF (congestive heart failure) (HCC)   3. Hypertension, unspecified type   4. Hyperlipidemia, unspecified hyperlipidemia type     PLAN:    Chronic Diastolic Heart Failure- Previous NICM .   LHC 2006 with normal cors. EF recovered in 2014 ~55%.  Echocardiogram 10/18/2020 showed normal biventricular function, grade 1 diastolic dysfunction, no significant valvular disease. -Appears euvolemic -Continue coreg 12.5 mg BID -Repeat echo prior to clinic appointment next year   HTN - Continue carvedilol 12.5 mg twice daily and amlodipine 7.5 mg a day.  BP appears controlled  HLD -On pravastatin 20 mg daily.  Calcium score 62 on 12/29/20 (63rd percentile).  LDL 100 on 01/07/22.  Did not  tolerate simvastatin or atorvastatin.  Recommend adding Zetia 10 mg daily, goal LDL less than 70.  Check fasting lipid panel in 2 to 3 months   Former Smoker - quit in 2016  Return to clinic in 1 year   Medication Adjustments/Labs and Tests Ordered: Current medicines are reviewed at length with the patient today.  Concerns regarding medicines are outlined above.  Orders Placed This Encounter  Procedures   ECHOCARDIOGRAM COMPLETE   Meds ordered this encounter  Medications   ezetimibe (ZETIA) 10 MG tablet    Sig: Take 1 tablet (10 mg total) by mouth daily.    Dispense:  90 tablet    Refill:  3   amLODipine (NORVASC) 2.5 MG tablet    Sig: Take 1 tablet (2.5 mg total) by mouth daily. Take along with 5 mg (equal 7.5 mg)    Dispense:  90 tablet    Refill:  3    In addition to 5 mg tablet   amLODipine (NORVASC) 5 MG tablet    Sig: Take 1 tablet (5 mg total) by mouth daily. Take along with 2.5 mg equal 7.5 mg    Dispense:  90 tablet    Refill:  3    In addition to 2.5 mg tablet    Patient Instructions  Medication Instructions:  START Zetia 10 mg daily  *If you need a refill on your cardiac medications before your next appointment, please call your pharmacy*  Testing/Procedures: Your physician has requested that you have an echocardiogram in 1 YEAR. Echocardiography is a painless test that uses sound waves to create images of your heart. It provides your doctor with information about the size and shape of your heart and how well your heart's chambers and valves are working.  This procedure takes approximately one hour. There are no restrictions for this procedure. Please do NOT wear cologne, perfume, aftershave, or lotions (deodorant is allowed). Please arrive 15 minutes prior to your appointment time.  Follow-Up: At [facility], you and your health needs are our priority.  As part of our continuing mission to provide you with exceptional heart care, we have created  designated Provider Care Teams.  These Care Teams include your primary Cardiologist (physician) and Advanced Practice Providers (APPs -  Physician Assistants and Nurse Practitioners) who all work together to provide you with the care you need, when you need it.  We recommend signing up for the patient portal called "MyChart".  Sign up information is provided on this After Visit Summary.  MyChart is used to connect with patients for Virtual Visits (Telemedicine).  Patients are able to view lab/test results, encounter notes, upcoming appointments, etc.  Non-urgent messages can be sent to your provider as well.   To learn more about what you can do with MyChart, go to [email or web address].    Your next appointment:   12 month(s)  The format for your next appointment:   In Person  Provider:   Little Ishikawa, MD              Signed, Little Ishikawa, MD  05/09/2022 6:00 PM    [facility]

## 2022-05-09 ENCOUNTER — Encounter: Payer: Self-pay | Admitting: Cardiology

## 2022-05-09 ENCOUNTER — Ambulatory Visit: Payer: Medicare Other | Attending: Cardiology | Admitting: Cardiology

## 2022-05-09 VITALS — BP 132/66 | HR 90 | Ht 60.0 in | Wt 124.0 lb

## 2022-05-09 DIAGNOSIS — E785 Hyperlipidemia, unspecified: Secondary | ICD-10-CM | POA: Diagnosis not present

## 2022-05-09 DIAGNOSIS — I428 Other cardiomyopathies: Secondary | ICD-10-CM | POA: Diagnosis not present

## 2022-05-09 DIAGNOSIS — I10 Essential (primary) hypertension: Secondary | ICD-10-CM

## 2022-05-09 DIAGNOSIS — I5032 Chronic diastolic (congestive) heart failure: Secondary | ICD-10-CM | POA: Diagnosis not present

## 2022-05-09 MED ORDER — AMLODIPINE BESYLATE 2.5 MG PO TABS: 2.5000 mg | ORAL_TABLET | Freq: Every day | ORAL | 3 refills | Status: DC

## 2022-05-09 MED ORDER — AMLODIPINE BESYLATE 5 MG PO TABS: 5.0000 mg | ORAL_TABLET | Freq: Every day | ORAL | 3 refills | Status: DC

## 2022-05-09 MED ORDER — EZETIMIBE 10 MG PO TABS: 10.0000 mg | ORAL_TABLET | Freq: Every day | ORAL | 3 refills | Status: DC

## 2022-05-09 NOTE — Patient Instructions (Signed)
Medication Instructions:  START Zetia 10 mg daily  *If you need a refill on your cardiac medications before your next appointment, please call your pharmacy*  Testing/Procedures: Your physician has requested that you have an echocardiogram in 1 YEAR. Echocardiography is a painless test that uses sound waves to create images of your heart. It provides your doctor with information about the size and shape of your heart and how well your heart's chambers and valves are working. This procedure takes approximately one hour. There are no restrictions for this procedure. Please do NOT wear cologne, perfume, aftershave, or lotions (deodorant is allowed). Please arrive 15 minutes prior to your appointment time.  Follow-Up: At [facility], you and your health needs are our priority.  As part of our continuing mission to provide you with exceptional heart care, we have created designated Provider Care Teams.  These Care Teams include your primary Cardiologist (physician) and Advanced Practice Providers (APPs -  Physician Assistants and Nurse Practitioners) who all work together to provide you with the care you need, when you need it.  We recommend signing up for the patient portal called "MyChart".  Sign up information is provided on this After Visit Summary.  MyChart is used to connect with patients for Virtual Visits (Telemedicine).  Patients are able to view lab/test results, encounter notes, upcoming appointments, etc.  Non-urgent messages can be sent to your provider as well.   To learn more about what you can do with MyChart, go to [email or web address].    Your next appointment:   12 month(s)  The format for your next appointment:   In Person  Provider:   Little Ishikawa, MD

## 2022-05-20 DIAGNOSIS — M5411 Radiculopathy, occipito-atlanto-axial region: Secondary | ICD-10-CM | POA: Diagnosis not present

## 2022-05-20 DIAGNOSIS — M9901 Segmental and somatic dysfunction of cervical region: Secondary | ICD-10-CM | POA: Diagnosis not present

## 2022-06-10 DIAGNOSIS — M9901 Segmental and somatic dysfunction of cervical region: Secondary | ICD-10-CM | POA: Diagnosis not present

## 2022-06-10 DIAGNOSIS — M5411 Radiculopathy, occipito-atlanto-axial region: Secondary | ICD-10-CM | POA: Diagnosis not present

## 2022-07-03 ENCOUNTER — Other Ambulatory Visit: Payer: Self-pay | Admitting: Internal Medicine

## 2022-07-03 DIAGNOSIS — Z1231 Encounter for screening mammogram for malignant neoplasm of breast: Secondary | ICD-10-CM

## 2022-07-08 DIAGNOSIS — M5411 Radiculopathy, occipito-atlanto-axial region: Secondary | ICD-10-CM | POA: Diagnosis not present

## 2022-07-08 DIAGNOSIS — M9901 Segmental and somatic dysfunction of cervical region: Secondary | ICD-10-CM | POA: Diagnosis not present

## 2022-07-17 DIAGNOSIS — H5203 Hypermetropia, bilateral: Secondary | ICD-10-CM | POA: Diagnosis not present

## 2022-07-17 DIAGNOSIS — H04123 Dry eye syndrome of bilateral lacrimal glands: Secondary | ICD-10-CM | POA: Diagnosis not present

## 2022-07-17 DIAGNOSIS — Z961 Presence of intraocular lens: Secondary | ICD-10-CM | POA: Diagnosis not present

## 2022-07-29 DIAGNOSIS — M9901 Segmental and somatic dysfunction of cervical region: Secondary | ICD-10-CM | POA: Diagnosis not present

## 2022-07-29 DIAGNOSIS — M5411 Radiculopathy, occipito-atlanto-axial region: Secondary | ICD-10-CM | POA: Diagnosis not present

## 2022-08-08 DIAGNOSIS — N183 Chronic kidney disease, stage 3 unspecified: Secondary | ICD-10-CM | POA: Diagnosis not present

## 2022-08-08 DIAGNOSIS — E785 Hyperlipidemia, unspecified: Secondary | ICD-10-CM | POA: Diagnosis not present

## 2022-08-08 DIAGNOSIS — I700 Atherosclerosis of aorta: Secondary | ICD-10-CM | POA: Diagnosis not present

## 2022-08-08 DIAGNOSIS — F329 Major depressive disorder, single episode, unspecified: Secondary | ICD-10-CM | POA: Diagnosis not present

## 2022-08-08 DIAGNOSIS — I130 Hypertensive heart and chronic kidney disease with heart failure and stage 1 through stage 4 chronic kidney disease, or unspecified chronic kidney disease: Secondary | ICD-10-CM | POA: Diagnosis not present

## 2022-08-08 DIAGNOSIS — R7301 Impaired fasting glucose: Secondary | ICD-10-CM | POA: Diagnosis not present

## 2022-08-08 DIAGNOSIS — R0602 Shortness of breath: Secondary | ICD-10-CM | POA: Diagnosis not present

## 2022-08-08 DIAGNOSIS — I2510 Atherosclerotic heart disease of native coronary artery without angina pectoris: Secondary | ICD-10-CM | POA: Diagnosis not present

## 2022-08-08 DIAGNOSIS — H9190 Unspecified hearing loss, unspecified ear: Secondary | ICD-10-CM | POA: Diagnosis not present

## 2022-08-08 DIAGNOSIS — I509 Heart failure, unspecified: Secondary | ICD-10-CM | POA: Diagnosis not present

## 2022-08-09 ENCOUNTER — Telehealth: Payer: Self-pay | Admitting: Cardiology

## 2022-08-09 NOTE — Telephone Encounter (Signed)
  Pt c/o Shortness Of Breath: STAT if SOB developed within the last 24 hours or pt is noticeably SOB on the phone  1. Are you currently SOB (can you hear that pt is SOB on the phone)? no  2. How long have you been experiencing SOB? A couple weeks ago  3. Are you SOB when sitting or when up moving around? Moving around, during hikes  4. Are you currently experiencing any other symptoms? Shoes and pants are tighter   Pt c/o swelling: STAT is pt has developed SOB within 24 hours  If swelling, where is the swelling located? Feet  How much weight have you gained and in what time span? About 7 pounds since July  Have you gained 3 pounds in a day or 5 pounds in a week? no  Do you have a log of your daily weights (if so, list)? Was 121 lbs last time she saw Dr. Gardiner Rhyme and is 124 lbs now  Are you currently taking a fluid pill? Yes, took one today  Are you currently SOB? no  Have you traveled recently? No   Patient states she saw her PCP and was started back on lasix and potassium. She says she has been getting SOB more when moving around. She says she is in a hiking club and gets winded much sooner than normal and also gets SOB when she gets up and down to bowl. She says she does not see any visible swelling, but her shoes are tight and so are her pants. She says her weight has been fluctuating. She says her PCP also sent a letter for Dr. Gardiner Rhyme as well.

## 2022-08-09 NOTE — Telephone Encounter (Signed)
Returned call to patient, patient reports increased SOB with exertion, edema, and palpitations.     Reports she hikes every Wednesday and has noticed she becomes short of breath within the first few minutes of the hike.  She reports she typically can hike 4 miles with only SOB going up hills.    She also [place] and has noticed SOB with this as well.   SOB resolves with rest.  No CP.   She does report LE edema x 1 month, reports her shoes feel tight as well as her jeans.  She reports gaining 7 lbs since July.   She is able to lay flat at night and does not have to prop up to sleep.  She saw Dr. Joylene Draft and he restarted lasix and k at 20 mg QOD (started this today).   She is scheduled to see PCP back in 3 weeks.   No distress noted on phone.   No active symptoms.    Last OV 04/2022 with Dr. Gardiner Rhyme Hx: NICM, diastolic HF, HTN, HLD.    Due for repeat echo in November 2024. Last echo 2022, EF 55-60%, G1DD.  Advised would send to MD to review.

## 2022-08-11 NOTE — Telephone Encounter (Signed)
Agree, lets add her on on Tuesday

## 2022-08-12 NOTE — Telephone Encounter (Signed)
Appointment scheduled tomorrow at 3:30 pm with Dr. Gardiner Rhyme. Patient aware.

## 2022-08-13 ENCOUNTER — Ambulatory Visit: Payer: Medicare Other | Attending: Cardiology | Admitting: Cardiology

## 2022-08-13 ENCOUNTER — Encounter: Payer: Self-pay | Admitting: Cardiology

## 2022-08-13 VITALS — BP 132/78 | HR 90 | Ht 60.0 in | Wt 126.0 lb

## 2022-08-13 DIAGNOSIS — I10 Essential (primary) hypertension: Secondary | ICD-10-CM

## 2022-08-13 DIAGNOSIS — I428 Other cardiomyopathies: Secondary | ICD-10-CM | POA: Diagnosis not present

## 2022-08-13 DIAGNOSIS — I5032 Chronic diastolic (congestive) heart failure: Secondary | ICD-10-CM | POA: Diagnosis not present

## 2022-08-13 DIAGNOSIS — R0602 Shortness of breath: Secondary | ICD-10-CM

## 2022-08-13 NOTE — Patient Instructions (Signed)
Medication Instructions:  Your physician recommends that you continue on your current medications as directed. Please refer to the Current Medication list given to you today.  *If you need a refill on your cardiac medications before your next appointment, please call your pharmacy*   Lab Work: CMET, CBC, Mag, BNP today  If you have labs (blood work) drawn today and your tests are completely normal, you will receive your results only by: [place] (if you have MyChart) OR A paper copy in the mail If you have any lab test that is abnormal or we need to change your treatment, we will call you to review the results.   Testing/Procedures: Your physician has requested that you have an echocardiogram. Echocardiography is a painless test that uses sound waves to create images of your heart. It provides your doctor with information about the size and shape of your heart and how well your heart's chambers and valves are working. This procedure takes approximately one hour. There are no restrictions for this procedure. Please do NOT wear cologne, perfume, aftershave, or lotions (deodorant is allowed). Please arrive 15 minutes prior to your appointment time.  Follow-Up: At [facility], you and your health needs are our priority.  As part of our continuing mission to provide you with exceptional heart care, we have created designated Provider Care Teams.  These Care Teams include your primary Cardiologist (physician) and Advanced Practice Providers (APPs -  Physician Assistants and Nurse Practitioners) who all work together to provide you with the care you need, when you need it.  We recommend signing up for the patient portal called "MyChart".  Sign up information is provided on this After Visit Summary.  MyChart is used to connect with patients for Virtual Visits (Telemedicine).  Patients are able to view lab/test results, encounter notes, upcoming appointments, etc.  Non-urgent messages  can be sent to your provider as well.   To learn more about what you can do with MyChart, go to [email or web address].    Your next appointment:   3 month(s)  Provider:   Donato Heinz, MD

## 2022-08-13 NOTE — Progress Notes (Signed)
Cardiology Office Note:    Date:  08/13/2022   ID:  [name] [name], DOB 1951-02-02, MRN [id number]  PCP:  Crist Infante, MD  Cardiologist:  Donato Heinz, MD  Electrophysiologist:  None   Referring MD: Crist Infante, MD   No chief complaint on file.   History of Present Illness:    [name] [name] is a 72 y.o. female with a hx of an ICM with EF 25 to 35% in 2006 with recovery to normal systolic function now with chronic diastolic heart failure, tobacco use, hypertension, fibromyalgia who presents for initial clinic visit.  She previously followed with Dr. Haroldine Laws in the advanced heart failure clinic but was referred to general cardiology given resolution of her systolic heart failure.  Underwent a left heart catheterization in 2006, which showed no coronary disease.  Stress echocardiogram in 2014 was normal.  Echocardiogram 01/2016 showed LVEF [id number], grade 1 diastolic dysfunction.  Echocardiogram 10/18/2020 showed normal biventricular function, grade 1 diastolic dysfunction, no significant valvular disease.  Since last clinic visit, she reports she has been having recent shortness of breath.  She is in a hiking club and hikes that used to have no issues with now having shortness of breath.  Denies any chest pain.  Reports some lightheadedness with standing but denies any syncope.  Reports gained 7 pounds since September.  She was recently started on Lasix 20 mg every other day by Dr. Joylene Draft.  Reports weight is down 4 pounds since starting.   Wt Readings from Last 3 Encounters:  08/13/22 126 lb (57.2 kg)  05/09/22 124 lb (56.2 kg)  04/10/22 122 lb 6.4 oz (55.5 kg)    BP Readings from Last 3 Encounters:  08/13/22 132/78  05/09/22 132/66  04/10/22 130/79      Past Medical History:  Diagnosis Date   Allergy    Arthritis    Chronic kidney disease    Congestive heart failure (HCC)    kidney   Fibromyalgia    Fibromyalgia    Hearing loss of both ears    History of syncope     Hypertension    Lung nodules    Migraines    controlled with meds; down to once or twice yearly   Osteoporosis     Past Surgical History:  Procedure Laterality Date   BACK SURGERY  1986   ruptured disc   BREAST CYST ASPIRATION Right    BREAST SURGERY Right 1973   benign tumor   CATARACT EXTRACTION Bilateral    COLONOSCOPY     fibromas removed     HYSTEROSCOPY  04/1997   with polypectomy   TONSILLECTOMY     1960   TUBAL LIGATION      Current Medications: Current Meds  Medication Sig   acetaminophen (TYLENOL) 500 MG tablet Take 1,000 mg by mouth at bedtime. Takes PRN   amLODipine (NORVASC) 2.5 MG tablet Take 1 tablet (2.5 mg total) by mouth daily. Take along with 5 mg (equal 7.5 mg)   amLODipine (NORVASC) 5 MG tablet Take 1 tablet (5 mg total) by mouth daily. Take along with 2.5 mg equal 7.5 mg   aspirin 81 MG chewable tablet Chew 81 mg by mouth at bedtime.   BIOTIN PO Take 5,000 mg by mouth daily.   buPROPion (WELLBUTRIN SR) 150 MG 12 hr tablet Take 150 mg by mouth daily.    calcium carbonate (OS-CAL) 600 MG TABS Take 600 mg by mouth daily.   carvedilol (COREG) 12.5 MG  tablet Take 1 tablet (12.5 mg total) by mouth 2 (two) times daily with a meal.   Cinnamon 500 MG TABS Take 500 mg by mouth daily.   Coenzyme Q10 (CO Q 10) 100 MG CAPS Take 100 mg by mouth 2 (two) times daily.   cyclobenzaprine (FLEXERIL) 10 MG tablet Take 10 mg by mouth at bedtime.   diphenhydrAMINE (BENADRYL) 25 mg capsule Take 25 mg by mouth at bedtime as needed.   docusate sodium (COLACE) 100 MG capsule Take 100 mg by mouth daily.   fish oil-omega-3 fatty acids 1000 MG capsule Take 1 g by mouth 2 (two) times daily at 10 AM and 5 PM.   furosemide (LASIX) 20 MG tablet Take 20 mg by mouth every other day.   gabapentin (NEURONTIN) 100 MG capsule Take 200 mg by mouth at bedtime.    Hypromellose (ARTIFICIAL TEARS OP) Place 1 drop into both eyes daily as needed (dry eyes).   Melatonin ER 5 MG TBCR Take by mouth.    Multiple Vitamin (MULTIVITAMIN) tablet Take 1 tablet by mouth daily.   Naproxen Sodium (ALEVE PO) Take by mouth daily as needed.   potassium chloride SA (KLOR-CON M) 20 MEQ tablet Take 20 mEq by mouth every other day.   pravastatin (PRAVACHOL) 20 MG tablet Take 20 mg by mouth as directed. 5 TIMES WEEKLY   Riboflavin (VITAMIN B-2 PO) Take 1 tablet by mouth daily.   Triamcinolone Acetonide (NASACORT AQ NA) Place into the nose daily as needed.   TURMERIC PO Take 1 capsule by mouth daily.    Vitamin E (E 400 BLENDED PO) Take 400 mg by mouth 4 (four) times a week.   zolpidem (AMBIEN) 5 MG tablet Take 5 mg by mouth at bedtime as needed for sleep.     Allergies:   Citrus, Alendronate, Ezetimibe, Lactose intolerance (gi), Pseudoephedrine, Rosuvastatin, Silicon, Simvastatin, Sulfa antibiotics, and Escitalopram oxalate   Social History   Socioeconomic History   Marital status: Widowed    Spouse name: Not on file   Number of children: 3   Years of education: Not on file   Highest education level: High school graduate  Occupational History   Occupation: Press photographer REP    Employer: [email or web address]  Tobacco Use   Smoking status: Former    Packs/day: 0.30    Types: Cigarettes    Quit date: 12/20/2014    Years since quitting: 7.6   Smokeless tobacco: Never   Tobacco comments:    2 packs per week  Vaping Use   Vaping Use: Never used  Substance and Sexual Activity   Alcohol use: No   Drug use: No   Sexual activity: Not Currently    Partners: Male    Birth control/protection: Post-menopausal  Other Topics Concern   Not on file  Social History Narrative   Lives at home with her husband   Left handed   Drinks about 2 cups of caffeine daily   Social Determinants of Health   Financial Resource Strain: Not on file  Food Insecurity: Not on file  Transportation Needs: Not on file  Physical Activity: Not on file  Stress: Not on file  Social Connections: Not on file     Family  History: The patient's family history includes Hypertension in her father and mother. There is no history of Colon cancer, Esophageal cancer, Stomach cancer, or Rectal cancer.  ROS:   Please see the history of present illness. (+)  Lightheadedness (+) Shortness of breath  All other systems reviewed and are negative.  EKGs/Labs/Other Studies Reviewed:    The following studies were reviewed today:   EKG:   08/13/2022: Normal sinus rhythm, rate 90, less than 1 mm ST depressions in leads II, aVL, I, T wave inversions in leads V5-6    Recent Labs: No results found for requested labs within last 365 days.  Recent Lipid Panel    Component Value Date/Time   CHOL 278 10/21/2008 0000   TRIG 110 10/21/2008 0000   HDL 49 10/21/2008 0000   LDLCALC 207 10/21/2008 0000    Physical Exam:    VS:  BP 132/78   Pulse 90   Ht 5' (1.524 m)   Wt 126 lb (57.2 kg)   LMP 01/22/2001 (Approximate)   SpO2 97%   BMI 24.61 kg/m     Wt Readings from Last 3 Encounters:  08/13/22 126 lb (57.2 kg)  05/09/22 124 lb (56.2 kg)  04/10/22 122 lb 6.4 oz (55.5 kg)     GEN: Well nourished, well developed in no acute distress HEENT: Normal NECK: No JVD; No carotid bruits LYMPHATICS: No lymphadenopathy CARDIAC: RRR, no murmurs, rubs, gallops RESPIRATORY:  Clear to auscultation without rales, wheezing or rhonchi  ABDOMEN: Soft, non-tender, non-distended MUSCULOSKELETAL:  No edema; No deformity  SKIN: Warm and dry NEUROLOGIC:  Alert and oriented x 3 PSYCHIATRIC:  Normal affect   ASSESSMENT:    1. Shortness of breath   2. Chronic diastolic CHF (congestive heart failure) ([place])   3. NICM (nonischemic cardiomyopathy) ([place])   4. Hypertension, unspecified type      PLAN:    Chronic Diastolic Heart Failure- Previous NICM .   [place] 2006 with normal cors. EF recovered in 2014 ~55%.  Echocardiogram 10/18/2020 showed normal biventricular function, grade 1 diastolic dysfunction, no significant valvular  disease. -Reports recent shortness of breath and weight gain.  Started on Lasix 20 mg every other day.  Appears euvolemic in clinic today.  Check CMET, BNP.  Will update echocardiogram -Continue coreg 12.5 mg BID   HTN - Continue carvedilol 12.5 mg twice daily and amlodipine 7.5 mg a day.  BP appears controlled  HLD -On pravastatin 20 mg daily.  Calcium score 62 on 12/29/20 (63rd percentile).  LDL 100 on 01/07/22.  Did not tolerate simvastatin or atorvastatin.  Recommend adding Zetia 10 mg daily, goal LDL less than 70.  Check fasting lipid panel in 2 to 3 months   Former Smoker - quit in 2016  RTC in 3 months   Medication Adjustments/Labs and Tests Ordered: Current medicines are reviewed at length with the patient today.  Concerns regarding medicines are outlined above.  Orders Placed This Encounter  Procedures   Comprehensive metabolic panel   Magnesium   Brain natriuretic peptide   CBC   EKG 12-Lead   No orders of the defined types were placed in this encounter.   Patient Instructions  Medication Instructions:  Your physician recommends that you continue on your current medications as directed. Please refer to the Current Medication list given to you today.  *If you need a refill on your cardiac medications before your next appointment, please call your pharmacy*   Lab Work: CMET, CBC, Mag, BNP today  If you have labs (blood work) drawn today and your tests are completely normal, you will receive your results only by: [place] (if you have MyChart) OR A paper copy in the mail If you have any lab test that is abnormal or we  need to change your treatment, we will call you to review the results.   Testing/Procedures: Your physician has requested that you have an echocardiogram. Echocardiography is a painless test that uses sound waves to create images of your heart. It provides your doctor with information about the size and shape of your heart and how well your  heart's chambers and valves are working. This procedure takes approximately one hour. There are no restrictions for this procedure. Please do NOT wear cologne, perfume, aftershave, or lotions (deodorant is allowed). Please arrive 15 minutes prior to your appointment time.  Follow-Up: At [facility], you and your health needs are our priority.  As part of our continuing mission to provide you with exceptional heart care, we have created designated Provider Care Teams.  These Care Teams include your primary Cardiologist (physician) and Advanced Practice Providers (APPs -  Physician Assistants and Nurse Practitioners) who all work together to provide you with the care you need, when you need it.  We recommend signing up for the patient portal called "MyChart".  Sign up information is provided on this After Visit Summary.  MyChart is used to connect with patients for Virtual Visits (Telemedicine).  Patients are able to view lab/test results, encounter notes, upcoming appointments, etc.  Non-urgent messages can be sent to your provider as well.   To learn more about what you can do with MyChart, go to [email or web address].    Your next appointment:   3 month(s)  Provider:   Donato Heinz, MD          Signed, Donato Heinz, MD  08/13/2022 5:24 PM    [place]

## 2022-08-14 LAB — COMPREHENSIVE METABOLIC PANEL
ALT: 19 IU/L (ref 0–32)
AST: 20 IU/L (ref 0–40)
Albumin/Globulin Ratio: 2.9 — ABNORMAL HIGH (ref 1.2–2.2)
Albumin: 4.6 g/dL (ref 3.8–4.8)
Alkaline Phosphatase: 100 IU/L (ref 44–121)
BUN/Creatinine Ratio: 29 — ABNORMAL HIGH (ref 12–28)
BUN: 28 mg/dL — ABNORMAL HIGH (ref 8–27)
Bilirubin Total: <0.2 mg/dL (ref 0.0–1.2)
CO2: 25 mmol/L (ref 20–29)
Calcium: 9.9 mg/dL (ref 8.7–10.3)
Chloride: 101 mmol/L (ref 96–106)
Creatinine, Ser: 0.97 mg/dL (ref 0.57–1.00)
Globulin, Total: 1.6 g/dL (ref 1.5–4.5)
Glucose: 102 mg/dL — ABNORMAL HIGH (ref 70–99)
Potassium: 4.6 mmol/L (ref 3.5–5.2)
Sodium: 143 mmol/L (ref 134–144)
Total Protein: 6.2 g/dL (ref 6.0–8.5)
eGFR: 62 mL/min/{1.73_m2} (ref >59)

## 2022-08-14 LAB — CBC
Hematocrit: 39.9 % (ref 34.0–46.6)
Hemoglobin: 13.6 g/dL (ref 11.1–15.9)
MCH: 32.7 pg (ref 26.6–33.0)
MCHC: 34.1 g/dL (ref 31.5–35.7)
MCV: 96 fL (ref 79–97)
Platelets: 311 10*3/uL (ref 150–450)
RBC: 4.16 x10E6/uL (ref 3.77–5.28)
RDW: 11.8 % (ref 11.7–15.4)
WBC: 8.7 10*3/uL (ref 3.4–10.8)

## 2022-08-14 LAB — BRAIN NATRIURETIC PEPTIDE: BNP: 2.8 pg/mL (ref 0.0–100.0)

## 2022-08-14 LAB — MAGNESIUM: Magnesium: 1.9 mg/dL (ref 1.6–2.3)

## 2022-08-16 ENCOUNTER — Encounter: Payer: Self-pay | Admitting: *Deleted

## 2022-08-19 DIAGNOSIS — M9901 Segmental and somatic dysfunction of cervical region: Secondary | ICD-10-CM | POA: Diagnosis not present

## 2022-08-19 DIAGNOSIS — M5411 Radiculopathy, occipito-atlanto-axial region: Secondary | ICD-10-CM | POA: Diagnosis not present

## 2022-08-23 ENCOUNTER — Ambulatory Visit
Admission: RE | Admit: 2022-08-23 | Discharge: 2022-08-23 | Disposition: A | Discharge status: Home / Self Care | Payer: Medicare Other | Source: Ambulatory Visit | Attending: Internal Medicine | Admitting: Internal Medicine

## 2022-08-23 DIAGNOSIS — Z1231 Encounter for screening mammogram for malignant neoplasm of breast: Secondary | ICD-10-CM | POA: Diagnosis not present

## 2022-08-26 DIAGNOSIS — I130 Hypertensive heart and chronic kidney disease with heart failure and stage 1 through stage 4 chronic kidney disease, or unspecified chronic kidney disease: Secondary | ICD-10-CM | POA: Diagnosis not present

## 2022-08-26 DIAGNOSIS — I2510 Atherosclerotic heart disease of native coronary artery without angina pectoris: Secondary | ICD-10-CM | POA: Diagnosis not present

## 2022-08-26 DIAGNOSIS — I509 Heart failure, unspecified: Secondary | ICD-10-CM | POA: Diagnosis not present

## 2022-08-26 DIAGNOSIS — I959 Hypotension, unspecified: Secondary | ICD-10-CM | POA: Diagnosis not present

## 2022-09-09 DIAGNOSIS — M9901 Segmental and somatic dysfunction of cervical region: Secondary | ICD-10-CM | POA: Diagnosis not present

## 2022-09-09 DIAGNOSIS — M5411 Radiculopathy, occipito-atlanto-axial region: Secondary | ICD-10-CM | POA: Diagnosis not present

## 2022-09-13 ENCOUNTER — Ambulatory Visit (HOSPITAL_COMMUNITY): Payer: Medicare Other | Attending: Cardiovascular Disease

## 2022-09-13 DIAGNOSIS — I428 Other cardiomyopathies: Secondary | ICD-10-CM

## 2022-09-13 DIAGNOSIS — I5032 Chronic diastolic (congestive) heart failure: Secondary | ICD-10-CM

## 2022-09-13 LAB — ECHOCARDIOGRAM COMPLETE
Area-P 1/2: 3.15 cm2
S' Lateral: 2.9 cm

## 2022-09-18 ENCOUNTER — Telehealth: Payer: Self-pay | Admitting: Cardiology

## 2022-09-18 DIAGNOSIS — I5032 Chronic diastolic (congestive) heart failure: Secondary | ICD-10-CM

## 2022-09-18 DIAGNOSIS — Z79899 Other long term (current) drug therapy: Secondary | ICD-10-CM

## 2022-09-18 MED ORDER — FUROSEMIDE 20 MG PO TABS: 20.0000 mg | ORAL_TABLET | Freq: Every day | ORAL | 3 refills | Status: DC

## 2022-09-18 NOTE — Telephone Encounter (Signed)
Donato Heinz, MD 09/16/2022  5:58 AM EDT     No significant abnormalities   Patient aware of results.   Patient reports SOB has improved but reports weight still varies and fluctuates 3-5 lbs everyday.  She continues to take the lasix QOD.   Reports the morning after not taking the lasix, her weight increases 4-5 lbs overnight.   She takes the lasix and it returns to normal until the next day then increases again.  No change or correlation to diet.   She also reports BP being "all over the place"  BP/weight readings below:  3/8 530 pm 144/80 No lasix 120 lbs 3/9 245 pm 148/78 weight up 4 lbs overnight 124 lbs-took lasix 3/10 155 pm 131/67 weight 120-no lasix 3/11 12noon 127/70 125 lbs-took lasix 3/14 130 pm 156/77 121 lbs 3/16 140 pm 149/69 121 lbs 3/23 630 pm 165/79 125 lbs  Patient reports taking amlodipine 7.5 in the evening and coreg  in the am and pm.  Advised will review with Dr. Gardiner Rhyme and return call.

## 2022-09-18 NOTE — Telephone Encounter (Signed)
Would start lasix 20 mg daily and check BMET/mag in 1 week

## 2022-09-18 NOTE — Telephone Encounter (Signed)
Patient aware of recommendations.  Rx updated and lab orders placed.  Patient was taking potassium 38meq on days she takes lasix.   Will confirm with MD if this needs to be increased to daily as well.

## 2022-09-18 NOTE — Telephone Encounter (Signed)
Patient is calling in bout her echo results. Please advise

## 2022-09-19 ENCOUNTER — Encounter: Payer: Self-pay | Admitting: *Deleted

## 2022-09-19 MED ORDER — POTASSIUM CHLORIDE CRYS ER 20 MEQ PO TBCR: 20.0000 meq | EXTENDED_RELEASE_TABLET | Freq: Every day | ORAL | 3 refills | Status: DC

## 2022-09-19 NOTE — Telephone Encounter (Signed)
Yes that is fine, needs repeat BMET in 1 week to monitor

## 2022-09-19 NOTE — Telephone Encounter (Signed)
Patient made aware via Mychart (patient requested).  Rx updated.

## 2022-09-27 DIAGNOSIS — I5032 Chronic diastolic (congestive) heart failure: Secondary | ICD-10-CM | POA: Diagnosis not present

## 2022-09-27 DIAGNOSIS — Z79899 Other long term (current) drug therapy: Secondary | ICD-10-CM | POA: Diagnosis not present

## 2022-09-28 LAB — BASIC METABOLIC PANEL
BUN/Creatinine Ratio: 23 (ref 12–28)
BUN: 23 mg/dL (ref 8–27)
CO2: 28 mmol/L (ref 20–29)
Calcium: 9.6 mg/dL (ref 8.7–10.3)
Chloride: 100 mmol/L (ref 96–106)
Creatinine, Ser: 1.02 mg/dL — ABNORMAL HIGH (ref 0.57–1.00)
Glucose: 100 mg/dL — ABNORMAL HIGH (ref 70–99)
Potassium: 4.3 mmol/L (ref 3.5–5.2)
Sodium: 143 mmol/L (ref 134–144)
eGFR: 58 mL/min/{1.73_m2} — ABNORMAL LOW (ref >59)

## 2022-09-28 LAB — MAGNESIUM: Magnesium: 1.9 mg/dL (ref 1.6–2.3)

## 2022-09-30 DIAGNOSIS — M5411 Radiculopathy, occipito-atlanto-axial region: Secondary | ICD-10-CM | POA: Diagnosis not present

## 2022-09-30 DIAGNOSIS — M9901 Segmental and somatic dysfunction of cervical region: Secondary | ICD-10-CM | POA: Diagnosis not present

## 2022-10-14 DIAGNOSIS — M9901 Segmental and somatic dysfunction of cervical region: Secondary | ICD-10-CM | POA: Diagnosis not present

## 2022-10-14 DIAGNOSIS — M5411 Radiculopathy, occipito-atlanto-axial region: Secondary | ICD-10-CM | POA: Diagnosis not present

## 2022-10-28 NOTE — Progress Notes (Unsigned)
Cardiology Office Note:    Date:  10/31/2022   ID:  [name] [name], DOB 1951/02/21, MRN [id number]  PCP:  Rodrigo Ran, MD  Cardiologist:  Little Ishikawa, MD  Electrophysiologist:  None   Referring MD: Rodrigo Ran, MD   Chief Complaint  Patient presents with   Follow-up    3 months.   Congestive Heart Failure    History of Present Illness:    [name] [name] is a 72 y.o. female with a hx of an ICM with EF 25 to 35% in 2006 with recovery to normal systolic function now with chronic diastolic heart failure, tobacco use, hypertension, fibromyalgia who presents for initial clinic visit.  She previously followed with Dr. Gala Romney in the advanced heart failure clinic but was referred to general cardiology given resolution of her systolic heart failure.  Underwent a left heart catheterization in 2006, which showed no coronary disease.  Stress echocardiogram in 2014 was normal.  Echocardiogram 01/2016 showed LVEF 55%, grade 1 diastolic dysfunction.  Echocardiogram 10/18/2020 showed normal biventricular function, grade 1 diastolic dysfunction, no significant valvular disease.  Echocardiogram 08/2022 showed EF 60 to 65%, grade 1 diastolic dysfunction, normal RV function, no significant valvular disease.  Since last clinic visit, she reports she is doing well.  Denies any chest pain, lightheadedness, syncope, lower extremity edema, or palpitations.  She is in hiking club, reports sometimes gets short of breath during hikes.   Wt Readings from Last 3 Encounters:  10/31/22 126 lb (57.2 kg)  08/13/22 126 lb (57.2 kg)  05/09/22 124 lb (56.2 kg)    BP Readings from Last 3 Encounters:  10/31/22 110/60  08/13/22 132/78  05/09/22 132/66      Past Medical History:  Diagnosis Date   Allergy    Arthritis    Chronic kidney disease    Congestive heart failure (HCC)    kidney   Fibromyalgia    Fibromyalgia    Hearing loss of both ears    History of syncope    Hypertension    Lung nodules     Migraines    controlled with meds; down to once or twice yearly   Osteoporosis     Past Surgical History:  Procedure Laterality Date   BACK SURGERY  1986   ruptured disc   BREAST CYST ASPIRATION Right    BREAST SURGERY Right 1973   benign tumor   CATARACT EXTRACTION Bilateral    COLONOSCOPY     fibromas removed     HYSTEROSCOPY  04/1997   with polypectomy   TONSILLECTOMY     1960   TUBAL LIGATION      Current Medications: Current Meds  Medication Sig   acetaminophen (TYLENOL) 500 MG tablet Take 1,000 mg by mouth at bedtime. Takes PRN   amLODipine (NORVASC) 2.5 MG tablet Take 1 tablet (2.5 mg total) by mouth daily. Take along with 5 mg (equal 7.5 mg)   amLODipine (NORVASC) 5 MG tablet Take 1 tablet (5 mg total) by mouth daily. Take along with 2.5 mg equal 7.5 mg   aspirin 81 MG chewable tablet Chew 81 mg by mouth at bedtime.   b complex vitamins capsule Take 1 capsule by mouth daily.   BIOTIN PO Take 5,000 mg by mouth daily.   buPROPion (WELLBUTRIN SR) 150 MG 12 hr tablet Take 150 mg by mouth daily.    calcium carbonate (OS-CAL) 600 MG TABS Take 600 mg by mouth daily.   carvedilol (COREG) 12.5 MG tablet  Take 1 tablet (12.5 mg total) by mouth 2 (two) times daily with a meal.   Cinnamon 500 MG TABS Take 500 mg by mouth daily.   Coenzyme Q10 (CO Q 10) 100 MG CAPS Take 100 mg by mouth 2 (two) times daily.   cyclobenzaprine (FLEXERIL) 10 MG tablet Take 10 mg by mouth at bedtime.   diphenhydrAMINE (BENADRYL) 25 mg capsule Take 25 mg by mouth at bedtime as needed.   docusate sodium (COLACE) 100 MG capsule Take 100 mg by mouth daily.   fish oil-omega-3 fatty acids 1000 MG capsule Take 1 g by mouth 2 (two) times daily at 10 AM and 5 PM.   furosemide (LASIX) 20 MG tablet Take 1 tablet (20 mg total) by mouth daily.   gabapentin (NEURONTIN) 100 MG capsule Take 200 mg by mouth at bedtime.    Hypromellose (ARTIFICIAL TEARS OP) Place 1 drop into both eyes daily as needed (dry eyes).    Melatonin ER 5 MG TBCR Take by mouth.   Multiple Vitamin (MULTIVITAMIN) tablet Take 1 tablet by mouth daily.   Naproxen Sodium (ALEVE PO) Take by mouth daily as needed.   potassium chloride SA (KLOR-CON M) 20 MEQ tablet Take 1 tablet (20 mEq total) by mouth daily.   pravastatin (PRAVACHOL) 20 MG tablet Take 20 mg by mouth as directed. 5 TIMES WEEKLY   Triamcinolone Acetonide (NASACORT AQ NA) Place into the nose daily as needed.   TURMERIC PO Take 1 capsule by mouth daily.    Vitamin E (E 400 BLENDED PO) Take 400 mg by mouth 4 (four) times a week.   zolpidem (AMBIEN) 5 MG tablet Take 5 mg by mouth at bedtime as needed for sleep.   [DISCONTINUED] Riboflavin (VITAMIN B-2 PO) Take 1 tablet by mouth daily.     Allergies:   Citrus, Alendronate, Ezetimibe, Lactose intolerance (gi), Pseudoephedrine, Rosuvastatin, Silicon, Simvastatin, Sulfa antibiotics, and Escitalopram oxalate   Social History   Socioeconomic History   Marital status: Widowed    Spouse name: Not on file   Number of children: 3   Years of education: Not on file   Highest education level: High school graduate  Occupational History   Occupation: Airline pilot REP    Employer: [employer]  Tobacco Use   Smoking status: Former    Packs/day: .3    Types: Cigarettes    Quit date: 12/20/2014    Years since quitting: 7.8   Smokeless tobacco: Never   Tobacco comments:    2 packs per week  Vaping Use   Vaping Use: Never used  Substance and Sexual Activity   Alcohol use: No   Drug use: No   Sexual activity: Not Currently    Partners: Male    Birth control/protection: Post-menopausal  Other Topics Concern   Not on file  Social History Narrative   Lives at home with her husband   Left handed   Drinks about 2 cups of caffeine daily   Social Determinants of Health   Financial Resource Strain: Not on file  Food Insecurity: Not on file  Transportation Needs: Not on file  Physical Activity: Not on file  Stress: Not on  file  Social Connections: Not on file     Family History: The patient's family history includes Hypertension in her father and mother. There is no history of Colon cancer, Esophageal cancer, Stomach cancer, or Rectal cancer.  ROS:   Please see the history of present illness. (+)  Lightheadedness (+) Shortness  of breath All other systems reviewed and are negative.  EKGs/Labs/Other Studies Reviewed:    The following studies were reviewed today:   EKG:   08/13/2022: Normal sinus rhythm, rate 90, less than 1 mm ST depressions in leads II, aVL, I, T wave inversions in leads V5-6    Recent Labs: 08/13/2022: ALT 19; BNP 2.8; Hemoglobin 13.6; Platelets 311 09/27/2022: BUN 23; Creatinine, Ser 1.02; Magnesium 1.9; Potassium 4.3; Sodium 143  Recent Lipid Panel    Component Value Date/Time   CHOL 278 10/21/2008 0000   TRIG 110 10/21/2008 0000   HDL 49 10/21/2008 0000   LDLCALC 207 10/21/2008 0000    Physical Exam:    VS:  BP 110/60 (BP Location: Left Arm, Patient Position: Sitting, Cuff Size: Normal)   Pulse 78   Ht 5' (1.524 m)   Wt 126 lb (57.2 kg)   LMP 01/22/2001 (Approximate)   BMI 24.61 kg/m     Wt Readings from Last 3 Encounters:  10/31/22 126 lb (57.2 kg)  08/13/22 126 lb (57.2 kg)  05/09/22 124 lb (56.2 kg)     GEN: Well nourished, well developed in no acute distress HEENT: Normal NECK: No JVD; No carotid bruits LYMPHATICS: No lymphadenopathy CARDIAC: RRR, no murmurs, rubs, gallops RESPIRATORY:  Clear to auscultation without rales, wheezing or rhonchi  ABDOMEN: Soft, non-tender, non-distended MUSCULOSKELETAL:  No edema; No deformity  SKIN: Warm and dry NEUROLOGIC:  Alert and oriented x 3 PSYCHIATRIC:  Normal affect   ASSESSMENT:    1. Chronic diastolic CHF (congestive heart failure) (HCC)   2. Hypertension, unspecified type   3. Hyperlipidemia, unspecified hyperlipidemia type     PLAN:    Chronic Diastolic Heart Failure- Previous NICM.  [place] 2006 with  normal cors. EF recovered in 2014 ~55%.  Echocardiogram 10/18/2020 showed normal biventricular function, grade 1 diastolic dysfunction, no significant valvular disease.  Echocardiogram 08/2022 showed EF 60 to 65%, grade 1 diastolic dysfunction, normal RV function, no significant valvular disease. -Continue Lasix 20 mg daily.  Check BMET, magnesium -Continue coreg 12.5 mg BID   HTN - Continue carvedilol 12.5 mg twice daily and amlodipine 7.5 mg a day.  BP appears controlled  HLD -On pravastatin 20 mg daily.  Calcium score 62 on 12/29/20 (63rd percentile).  LDL 100 on 01/07/22.  Did not tolerate simvastatin or atorvastatin.  Started on Zetia 10 mg daily, but did not tolerate.  Will check lipid panel   Former Smoker - quit in 2016  RTC in 6 months   Medication Adjustments/Labs and Tests Ordered: Current medicines are reviewed at length with the patient today.  Concerns regarding medicines are outlined above.  Orders Placed This Encounter  Procedures   Basic metabolic panel   Magnesium   Lipid panel   No orders of the defined types were placed in this encounter.   Patient Instructions  Medication Instructions:  Your physician recommends that you continue on your current medications as directed. Please refer to the Current Medication list given to you today.  *If you need a refill on your cardiac medications before your next appointment, please call your pharmacy*  Lab Work: BMET, Mag, Lipid today  If you have labs (blood work) drawn today and your tests are completely normal, you will receive your results only by: MyChart Message (if you have MyChart) OR A paper copy in the mail If you have any lab test that is abnormal or we need to change your treatment, we will call you to review  the results.  Follow-Up: At [facility], you and your health needs are our priority.  As part of our continuing mission to provide you with exceptional heart care, we have created designated  Provider Care Teams.  These Care Teams include your primary Cardiologist (physician) and Advanced Practice Providers (APPs -  Physician Assistants and Nurse Practitioners) who all work together to provide you with the care you need, when you need it.  We recommend signing up for the patient portal called "MyChart".  Sign up information is provided on this After Visit Summary.  MyChart is used to connect with patients for Virtual Visits (Telemedicine).  Patients are able to view lab/test results, encounter notes, upcoming appointments, etc.  Non-urgent messages can be sent to your provider as well.   To learn more about what you can do with MyChart, go to [email or web address].    Your next appointment:   6 month(s)  Provider:   Little Ishikawa, MD          Signed, Little Ishikawa, MD  10/31/2022 5:54 PM    [facility]

## 2022-10-31 ENCOUNTER — Ambulatory Visit: Payer: Medicare Other | Attending: Cardiology | Admitting: Cardiology

## 2022-10-31 ENCOUNTER — Encounter: Payer: Self-pay | Admitting: Cardiology

## 2022-10-31 VITALS — BP 110/60 | HR 78 | Ht 60.0 in | Wt 126.0 lb

## 2022-10-31 DIAGNOSIS — E785 Hyperlipidemia, unspecified: Secondary | ICD-10-CM | POA: Diagnosis not present

## 2022-10-31 DIAGNOSIS — I10 Essential (primary) hypertension: Secondary | ICD-10-CM | POA: Diagnosis not present

## 2022-10-31 DIAGNOSIS — I5032 Chronic diastolic (congestive) heart failure: Secondary | ICD-10-CM | POA: Insufficient documentation

## 2022-10-31 NOTE — Patient Instructions (Signed)
Medication Instructions:  Your physician recommends that you continue on your current medications as directed. Please refer to the Current Medication list given to you today.  *If you need a refill on your cardiac medications before your next appointment, please call your pharmacy*  Lab Work: BMET, Mag, Lipid today  If you have labs (blood work) drawn today and your tests are completely normal, you will receive your results only by: MyChart Message (if you have MyChart) OR A paper copy in the mail If you have any lab test that is abnormal or we need to change your treatment, we will call you to review the results.  Follow-Up: At [facility], you and your health needs are our priority.  As part of our continuing mission to provide you with exceptional heart care, we have created designated Provider Care Teams.  These Care Teams include your primary Cardiologist (physician) and Advanced Practice Providers (APPs -  Physician Assistants and Nurse Practitioners) who all work together to provide you with the care you need, when you need it.  We recommend signing up for the patient portal called "MyChart".  Sign up information is provided on this After Visit Summary.  MyChart is used to connect with patients for Virtual Visits (Telemedicine).  Patients are able to view lab/test results, encounter notes, upcoming appointments, etc.  Non-urgent messages can be sent to your provider as well.   To learn more about what you can do with MyChart, go to [email or web address].    Your next appointment:   6 month(s)  Provider:   Christopher L Schumann, MD      

## 2022-11-01 ENCOUNTER — Other Ambulatory Visit: Payer: Self-pay | Admitting: *Deleted

## 2022-11-01 DIAGNOSIS — E785 Hyperlipidemia, unspecified: Secondary | ICD-10-CM

## 2022-11-01 LAB — LIPID PANEL
Chol/HDL Ratio: 3.5 ratio (ref 0.0–4.4)
Cholesterol, Total: 219 mg/dL — ABNORMAL HIGH (ref 100–199)
HDL: 62 mg/dL (ref >39)
LDL Chol Calc (NIH): 119 mg/dL — ABNORMAL HIGH (ref 0–99)
Triglycerides: 219 mg/dL — ABNORMAL HIGH (ref 0–149)
VLDL Cholesterol Cal: 38 mg/dL (ref 5–40)

## 2022-11-01 LAB — BASIC METABOLIC PANEL
BUN/Creatinine Ratio: 16 (ref 12–28)
BUN: 16 mg/dL (ref 8–27)
CO2: 26 mmol/L (ref 20–29)
Calcium: 9.7 mg/dL (ref 8.7–10.3)
Chloride: 103 mmol/L (ref 96–106)
Creatinine, Ser: 1.03 mg/dL — ABNORMAL HIGH (ref 0.57–1.00)
Glucose: 95 mg/dL (ref 70–99)
Potassium: 4.6 mmol/L (ref 3.5–5.2)
Sodium: 145 mmol/L — ABNORMAL HIGH (ref 134–144)
eGFR: 57 mL/min/{1.73_m2} — ABNORMAL LOW (ref >59)

## 2022-11-01 LAB — MAGNESIUM: Magnesium: 1.9 mg/dL (ref 1.6–2.3)

## 2022-11-01 MED ORDER — FUROSEMIDE 20 MG PO TABS: 20.0000 mg | ORAL_TABLET | ORAL | 3 refills | Status: DC

## 2022-11-01 MED ORDER — POTASSIUM CHLORIDE CRYS ER 20 MEQ PO TBCR: 20.0000 meq | EXTENDED_RELEASE_TABLET | ORAL | 3 refills | Status: DC

## 2022-11-04 ENCOUNTER — Telehealth: Payer: Self-pay | Admitting: Cardiology

## 2022-11-04 DIAGNOSIS — M9901 Segmental and somatic dysfunction of cervical region: Secondary | ICD-10-CM | POA: Diagnosis not present

## 2022-11-04 DIAGNOSIS — M5411 Radiculopathy, occipito-atlanto-axial region: Secondary | ICD-10-CM | POA: Diagnosis not present

## 2022-11-04 NOTE — Telephone Encounter (Signed)
Called pt to sch referral for pharm D.  She would like a better understanding as to why she was referred to Pharm D.  She has questions referring the referral    Best number [phone]

## 2022-11-04 NOTE — Telephone Encounter (Signed)
Called patient, advised of the need for pharmacy team referral.   Patient is willing to try this.   Scheduling team- please reach out to get scheduled.   Thanks!

## 2022-12-05 DIAGNOSIS — M1811 Unilateral primary osteoarthritis of first carpometacarpal joint, right hand: Secondary | ICD-10-CM | POA: Diagnosis not present

## 2022-12-05 DIAGNOSIS — M25532 Pain in left wrist: Secondary | ICD-10-CM | POA: Diagnosis not present

## 2022-12-05 DIAGNOSIS — M19032 Primary osteoarthritis, left wrist: Secondary | ICD-10-CM | POA: Diagnosis not present

## 2022-12-09 DIAGNOSIS — M9901 Segmental and somatic dysfunction of cervical region: Secondary | ICD-10-CM | POA: Diagnosis not present

## 2022-12-09 DIAGNOSIS — M5411 Radiculopathy, occipito-atlanto-axial region: Secondary | ICD-10-CM | POA: Diagnosis not present

## 2022-12-11 ENCOUNTER — Ambulatory Visit: Payer: Medicare Other | Attending: Internal Medicine | Admitting: Student

## 2022-12-11 ENCOUNTER — Encounter: Payer: Self-pay | Admitting: Student

## 2022-12-11 DIAGNOSIS — E7800 Pure hypercholesterolemia, unspecified: Secondary | ICD-10-CM | POA: Insufficient documentation

## 2022-12-11 DIAGNOSIS — E785 Hyperlipidemia, unspecified: Secondary | ICD-10-CM | POA: Diagnosis not present

## 2022-12-11 MED ORDER — PRAVASTATIN SODIUM 40 MG PO TABS: ORAL_TABLET | ORAL | 3 refills | Status: DC

## 2022-12-11 MED ORDER — PRAVASTATIN SODIUM 20 MG PO TABS: ORAL_TABLET | ORAL | 0 refills | Status: DC

## 2022-12-11 NOTE — Patient Instructions (Addendum)
Changes made by your pharmacist Carmela Hurt, PharmD at today's visit:    Instructions/Changes  (what do you need to do) Your Notes  (what you did and when you did it)  Start  taking 20 mg pravastatin twice a week on the days when NOT taking 40 mg    continue taking Pravastatin 40 mg 5 days a week    Repeat lipid lab mid September       If you have any questions or concerns please use My Chart to send questions or call the office at [phone]   Other potential medication that we discussed today are  Repatha or Praluent  Nexletol

## 2022-12-11 NOTE — Assessment & Plan Note (Signed)
Assessment:  LDL goal: < 100 mg/dl last LDLc 161 mg/dl (09/60/4540) Tolerates pravastatin 40 mg 5 times per week  Intolerance to Crestor, Zetia - aches and simvastatin - flu like symptoms  Discussed next potential options (PCSK-9 inhibitors, bempedoic acid and inclisiran); cost, dosing efficacy, side effects  Does not like taking injections and does not likes side effects of nexletol as she already suffers from tendonitis Follows low fat healthy diet and physically active   Plan: Continue taking current medications pravastatin 40 mg 5 times per week start taking 20 mg pravastatin on days when not taking 40 mg   Will repeat FLP in 3 months

## 2022-12-11 NOTE — Progress Notes (Unsigned)
Patient ID: [name]                 DOB: [birth year]                    MRN: [id number]      HPI: [name] [name] is a 72 y.o. female patient referred to lipid clinic by Dr.Schumann. PMH is significant for CHF, HTN, osteoporosis, fibromyalgia.   Patient presented today for lipid clinic. Reports she takes pravastatin 40 mg 5 days a week. Unable to tolerate 7 days a week. In the past she had tried ezetimibe and Crestor due to aches and pain she had stopped taking them. She is very active. Go for bowling once week summer months and twice week  during winter months, does lots of yard work. Go for hike every wed 3-4 miles enjoys walking in the neighborhood some days of week. Eats low fat low carb diet loves cheese but eats only 3 times per week due to lactose intolerance can't eat lots of dairy products. Reviewed options for lowering LDL cholesterol, including PCSK-9 inhibitors, bempedoic acid and inclisiran.  Discussed mechanisms of action, dosing, side effects and potential decreases in LDL cholesterol.  Also reviewed cost information and potential options for patient assistance. Patient is afraid to do injections and already has tendonitis so does not want to try nexletol     Diet: low fat low starch food, she likes cheese   Exercise: bowling one a week, [employer] -walks every wed 3-4 miles, walks around the neighborhood, loves doing yard work    Family History: father - CHF  Labs: Lipid Panel     Component Value Date/Time   CHOL 219 (H) 10/31/2022 1444   TRIG 219 (H) 10/31/2022 1444   TRIG 110 10/21/2008 0000   HDL 62 10/31/2022 1444   CHOLHDL 3.5 10/31/2022 1444   LDLCALC 119 (H) 10/31/2022 1444   LABVLDL 38 10/31/2022 1444    Past Medical History:  Diagnosis Date   Allergy    Arthritis    Chronic kidney disease    Congestive heart failure (HCC)    kidney   Fibromyalgia    Fibromyalgia    Hearing loss of both ears    History of syncope    Hypertension    Lung nodules     Migraines    controlled with meds; down to once or twice yearly   Osteoporosis     Current Outpatient Medications on File Prior to Visit  Medication Sig Dispense Refill   fish oil-omega-3 fatty acids 1000 MG capsule Take 1 g by mouth 2 (two) times daily at 10 AM and 5 PM.     acetaminophen (TYLENOL) 500 MG tablet Take 1,000 mg by mouth at bedtime. Takes PRN     amLODipine (NORVASC) 2.5 MG tablet Take 1 tablet (2.5 mg total) by mouth daily. Take along with 5 mg (equal 7.5 mg) 90 tablet 3   amLODipine (NORVASC) 5 MG tablet Take 1 tablet (5 mg total) by mouth daily. Take along with 2.5 mg equal 7.5 mg 90 tablet 3   aspirin 81 MG chewable tablet Chew 81 mg by mouth at bedtime.     b complex vitamins capsule Take 1 capsule by mouth daily.     BIOTIN PO Take 5,000 mg by mouth daily.     buPROPion (WELLBUTRIN SR) 150 MG 12 hr tablet Take 150 mg by mouth daily.      calcium carbonate (OS-CAL) 600  MG TABS Take 600 mg by mouth daily.     carvedilol (COREG) 12.5 MG tablet Take 1 tablet (12.5 mg total) by mouth 2 (two) times daily with a meal. 60 tablet 0   Cinnamon 500 MG TABS Take 500 mg by mouth daily.     Coenzyme Q10 (CO Q 10) 100 MG CAPS Take 100 mg by mouth 2 (two) times daily.     cyclobenzaprine (FLEXERIL) 10 MG tablet Take 10 mg by mouth at bedtime.     diphenhydrAMINE (BENADRYL) 25 mg capsule Take 25 mg by mouth at bedtime as needed.     docusate sodium (COLACE) 100 MG capsule Take 100 mg by mouth daily.     furosemide (LASIX) 20 MG tablet Take 1 tablet (20 mg total) by mouth every other day. 45 tablet 3   gabapentin (NEURONTIN) 100 MG capsule Take 200 mg by mouth at bedtime.   11   Hypromellose (ARTIFICIAL TEARS OP) Place 1 drop into both eyes daily as needed (dry eyes).     Melatonin ER 5 MG TBCR Take by mouth.     Multiple Vitamin (MULTIVITAMIN) tablet Take 1 tablet by mouth daily.     Naproxen Sodium (ALEVE PO) Take by mouth daily as needed.     potassium chloride SA (KLOR-CON M) 20  MEQ tablet Take 1 tablet (20 mEq total) by mouth every other day. 45 tablet 3   Triamcinolone Acetonide (NASACORT AQ NA) Place into the nose daily as needed.     TURMERIC PO Take 1 capsule by mouth daily.      Vitamin E (E 400 BLENDED PO) Take 400 mg by mouth 4 (four) times a week.     zolpidem (AMBIEN) 5 MG tablet Take 5 mg by mouth at bedtime as needed for sleep.     No current facility-administered medications on file prior to visit.    Allergies  Allergen Reactions   Citrus Other (See Comments)    MIGRAINES (no citric acid, either)   Alendronate     Other Reaction(s): stomach upset   Ezetimibe     Other Reaction(s): achiness   Lactose Intolerance (Gi)     N and V, BP drops   Pseudoephedrine     Other Reaction(s): heart races   Rosuvastatin     Other Reaction(s): ache all over.   Silicon Swelling   Simvastatin     Other Reaction(s): "felt like had flu"   Sulfa Antibiotics Nausea And Vomiting   Escitalopram Oxalate Other (See Comments)    Flu-like symptoms without fever    Assessment/Plan:  1. Hyperlipidemia -  Problem  Hypercholesteremia   Current Medications: pravastatin 40 mg 5 times per week  Intolerances: rosuvastatin- aches , simvastatin- flu like symptoms , ezetimibe - aches  Risk Factors: CAC score 62 (63 rd percentile) 10 yrs ASCVD risk score 13.7% LDL goal: <100 mg/dl  Last LDLc: 956 TG 213 TC 219 HDL 62 (10/31/2022)    Hypercholesteremia Assessment:  LDL goal: < 100 mg/dl last LDLc 086 mg/dl (57/84/6962) Tolerates pravastatin 40 mg 5 times per week  Intolerance to Crestor, Zetia - aches and simvastatin - flu like symptoms  Discussed next potential options (PCSK-9 inhibitors, bempedoic acid and inclisiran); cost, dosing efficacy, side effects  Does not like taking injections and does not likes side effects of nexletol as she already suffers from tendonitis Follows low fat healthy diet and physically active   Plan: Continue taking current medications  pravastatin 40 mg 5 times per  week start taking 20 mg pravastatin on days when not taking 40 mg   Will repeat FLP in 3 months     Thank you,  Carmela Hurt, Pharm.D [facility] A Division of [facility] [address] [place], [place] [address]  Phone: [phone]; Fax: [phone]

## 2022-12-12 ENCOUNTER — Telehealth: Payer: Self-pay | Admitting: Pharmacist

## 2022-12-12 ENCOUNTER — Telehealth: Payer: Self-pay

## 2022-12-12 ENCOUNTER — Other Ambulatory Visit (HOSPITAL_COMMUNITY): Payer: Self-pay

## 2022-12-12 MED ORDER — ICOSAPENT ETHYL 1 G PO CAPS: 2.0000 g | ORAL_CAPSULE | Freq: Two times a day (BID) | ORAL | 11 refills | Status: DC

## 2022-12-12 NOTE — Telephone Encounter (Signed)
   Per test claim no p/a is needed for ''iscosapent ethyl'' geneic only :

## 2022-12-12 NOTE — Telephone Encounter (Signed)
Patient made aware about Vascepa coverage.

## 2022-12-13 ENCOUNTER — Telehealth: Payer: Self-pay

## 2022-12-13 NOTE — Telephone Encounter (Signed)
[facility] mail order pharmacy stating that their records indicates the pt is allergic to rosuvastatin and wanted to know if pravastatin tablets would be appropriate to dispense. Would Dr. Bjorn Pippin like to still prescribe this medication? Please address. Inv# [id number]  Ph# [phone]

## 2022-12-16 MED ORDER — PRAVASTATIN SODIUM 40 MG PO TABS: ORAL_TABLET | ORAL | 3 refills | Status: DC

## 2022-12-16 MED ORDER — PRAVASTATIN SODIUM 20 MG PO TABS: ORAL_TABLET | ORAL | 3 refills | Status: DC

## 2022-12-16 NOTE — Telephone Encounter (Signed)
Spoke to patient she stated she is taking Pravastatin 20 mg 2 x week on Sun and Wed.She takes 40 mg on all other days.Advised I will send refills in to [facility].  Stated she cannot afford Vascepa $ 250.Advised I will send message to Pharm D that prescribed.

## 2022-12-16 NOTE — Telephone Encounter (Signed)
Yes would continue pravastatin

## 2022-12-17 DIAGNOSIS — M79662 Pain in left lower leg: Secondary | ICD-10-CM | POA: Diagnosis not present

## 2022-12-18 ENCOUNTER — Telehealth: Payer: Self-pay | Admitting: Cardiology

## 2022-12-18 MED ORDER — ICOSAPENT ETHYL 1 G PO CAPS: 2.0000 g | ORAL_CAPSULE | Freq: Two times a day (BID) | ORAL | 3 refills | Status: DC

## 2022-12-18 NOTE — Telephone Encounter (Signed)
Returned call to pt, advised her Susa Day is off today.   She is having issues with her fish oil and her pravastatin.  States [facility] told her they wouldn't send out rx for pravastatin until they spoke with the MD office. Not sure if they're having issues processing since there are 2 rx with different strengths/instructions. I called [facility] and was transferred 3 times. Spent 15 minutes on the phone for them to tell me there's no issue processing her rx and the pt just needs to provide consent for pt to ship the med. Called pt back and advised her of that.  She also has questions about the fish oil and the grant. Mail order pharmacies don't accept grants/copay cards. I have resent fish oil to her local pharmacy and advised her she will need to show grant info from [place] message to the pharmacy so that med will be free.

## 2022-12-18 NOTE — Telephone Encounter (Signed)
Patient is following up, requesting a call back from [facility], [facility].

## 2022-12-18 NOTE — Telephone Encounter (Signed)
Follow Up:         Patient said she was returning  [place] call.

## 2022-12-23 ENCOUNTER — Other Ambulatory Visit: Payer: Self-pay

## 2022-12-23 MED ORDER — POTASSIUM CHLORIDE CRYS ER 20 MEQ PO TBCR: 20.0000 meq | EXTENDED_RELEASE_TABLET | ORAL | 3 refills | Status: DC

## 2022-12-27 ENCOUNTER — Other Ambulatory Visit: Payer: Self-pay

## 2022-12-27 MED ORDER — FUROSEMIDE 20 MG PO TABS: 20.0000 mg | ORAL_TABLET | ORAL | 3 refills | Status: DC

## 2022-12-30 DIAGNOSIS — M9901 Segmental and somatic dysfunction of cervical region: Secondary | ICD-10-CM | POA: Diagnosis not present

## 2022-12-30 DIAGNOSIS — M5411 Radiculopathy, occipito-atlanto-axial region: Secondary | ICD-10-CM | POA: Diagnosis not present

## 2022-12-31 ENCOUNTER — Other Ambulatory Visit: Payer: Self-pay

## 2022-12-31 MED ORDER — FUROSEMIDE 20 MG PO TABS: 20.0000 mg | ORAL_TABLET | ORAL | 3 refills | Status: DC

## 2023-01-20 DIAGNOSIS — M9901 Segmental and somatic dysfunction of cervical region: Secondary | ICD-10-CM | POA: Diagnosis not present

## 2023-01-20 DIAGNOSIS — M5411 Radiculopathy, occipito-atlanto-axial region: Secondary | ICD-10-CM | POA: Diagnosis not present

## 2023-02-10 DIAGNOSIS — M5411 Radiculopathy, occipito-atlanto-axial region: Secondary | ICD-10-CM | POA: Diagnosis not present

## 2023-02-10 DIAGNOSIS — G510 Bell's palsy: Secondary | ICD-10-CM | POA: Diagnosis not present

## 2023-02-10 DIAGNOSIS — H02401 Unspecified ptosis of right eyelid: Secondary | ICD-10-CM | POA: Diagnosis not present

## 2023-02-10 DIAGNOSIS — M9901 Segmental and somatic dysfunction of cervical region: Secondary | ICD-10-CM | POA: Diagnosis not present

## 2023-02-26 DIAGNOSIS — R7989 Other specified abnormal findings of blood chemistry: Secondary | ICD-10-CM | POA: Diagnosis not present

## 2023-02-26 DIAGNOSIS — I130 Hypertensive heart and chronic kidney disease with heart failure and stage 1 through stage 4 chronic kidney disease, or unspecified chronic kidney disease: Secondary | ICD-10-CM | POA: Diagnosis not present

## 2023-02-26 DIAGNOSIS — Z1212 Encounter for screening for malignant neoplasm of rectum: Secondary | ICD-10-CM | POA: Diagnosis not present

## 2023-02-26 DIAGNOSIS — R7301 Impaired fasting glucose: Secondary | ICD-10-CM | POA: Diagnosis not present

## 2023-02-26 DIAGNOSIS — M810 Age-related osteoporosis without current pathological fracture: Secondary | ICD-10-CM | POA: Diagnosis not present

## 2023-02-26 DIAGNOSIS — E785 Hyperlipidemia, unspecified: Secondary | ICD-10-CM | POA: Diagnosis not present

## 2023-02-26 DIAGNOSIS — I509 Heart failure, unspecified: Secondary | ICD-10-CM | POA: Diagnosis not present

## 2023-03-05 DIAGNOSIS — Z0000 Encounter for general adult medical examination without abnormal findings: Secondary | ICD-10-CM | POA: Diagnosis not present

## 2023-03-05 DIAGNOSIS — M810 Age-related osteoporosis without current pathological fracture: Secondary | ICD-10-CM | POA: Diagnosis not present

## 2023-03-05 DIAGNOSIS — H9190 Unspecified hearing loss, unspecified ear: Secondary | ICD-10-CM | POA: Diagnosis not present

## 2023-03-05 DIAGNOSIS — R0902 Hypoxemia: Secondary | ICD-10-CM | POA: Diagnosis not present

## 2023-03-05 DIAGNOSIS — R7301 Impaired fasting glucose: Secondary | ICD-10-CM | POA: Diagnosis not present

## 2023-03-05 DIAGNOSIS — I509 Heart failure, unspecified: Secondary | ICD-10-CM | POA: Diagnosis not present

## 2023-03-05 DIAGNOSIS — E785 Hyperlipidemia, unspecified: Secondary | ICD-10-CM | POA: Diagnosis not present

## 2023-03-05 DIAGNOSIS — I130 Hypertensive heart and chronic kidney disease with heart failure and stage 1 through stage 4 chronic kidney disease, or unspecified chronic kidney disease: Secondary | ICD-10-CM | POA: Diagnosis not present

## 2023-03-05 DIAGNOSIS — Z1339 Encounter for screening examination for other mental health and behavioral disorders: Secondary | ICD-10-CM | POA: Diagnosis not present

## 2023-03-05 DIAGNOSIS — I2510 Atherosclerotic heart disease of native coronary artery without angina pectoris: Secondary | ICD-10-CM | POA: Diagnosis not present

## 2023-03-05 DIAGNOSIS — M25519 Pain in unspecified shoulder: Secondary | ICD-10-CM | POA: Diagnosis not present

## 2023-03-05 DIAGNOSIS — I700 Atherosclerosis of aorta: Secondary | ICD-10-CM | POA: Diagnosis not present

## 2023-03-05 DIAGNOSIS — R82998 Other abnormal findings in urine: Secondary | ICD-10-CM | POA: Diagnosis not present

## 2023-03-05 DIAGNOSIS — N182 Chronic kidney disease, stage 2 (mild): Secondary | ICD-10-CM | POA: Diagnosis not present

## 2023-03-05 DIAGNOSIS — Z1331 Encounter for screening for depression: Secondary | ICD-10-CM | POA: Diagnosis not present

## 2023-03-05 DIAGNOSIS — Z23 Encounter for immunization: Secondary | ICD-10-CM | POA: Diagnosis not present

## 2023-03-06 DIAGNOSIS — M9901 Segmental and somatic dysfunction of cervical region: Secondary | ICD-10-CM | POA: Diagnosis not present

## 2023-03-06 DIAGNOSIS — M5411 Radiculopathy, occipito-atlanto-axial region: Secondary | ICD-10-CM | POA: Diagnosis not present

## 2023-03-26 DIAGNOSIS — M9901 Segmental and somatic dysfunction of cervical region: Secondary | ICD-10-CM | POA: Diagnosis not present

## 2023-03-26 DIAGNOSIS — M5411 Radiculopathy, occipito-atlanto-axial region: Secondary | ICD-10-CM | POA: Diagnosis not present

## 2023-03-26 DIAGNOSIS — M7541 Impingement syndrome of right shoulder: Secondary | ICD-10-CM | POA: Diagnosis not present

## 2023-03-27 ENCOUNTER — Telehealth: Payer: Self-pay | Admitting: Pharmacist

## 2023-03-27 NOTE — Telephone Encounter (Signed)
Call patient for follow up lipid lab. Got lab done at PCP mid Sept.  LDLc 92 (goal<100) TG 154 (gaol <150) HDL 59 TC 182 (goal <200)  Patient states she read side effects of Vascepa and she never got the prescription filled but she continued taking pravastatin  40 mg five days per week and 20 mg on Wednesday and Sunday. Advised to continue with Pravastatin current does

## 2023-04-10 DIAGNOSIS — M5411 Radiculopathy, occipito-atlanto-axial region: Secondary | ICD-10-CM | POA: Diagnosis not present

## 2023-04-10 DIAGNOSIS — M9901 Segmental and somatic dysfunction of cervical region: Secondary | ICD-10-CM | POA: Diagnosis not present

## 2023-04-23 ENCOUNTER — Other Ambulatory Visit: Payer: Self-pay | Admitting: Cardiology

## 2023-04-23 MED ORDER — AMLODIPINE BESYLATE 5 MG PO TABS: 5.0000 mg | ORAL_TABLET | Freq: Every day | ORAL | 1 refills | Status: DC

## 2023-04-30 DIAGNOSIS — M19011 Primary osteoarthritis, right shoulder: Secondary | ICD-10-CM | POA: Diagnosis not present

## 2023-05-05 DIAGNOSIS — M5411 Radiculopathy, occipito-atlanto-axial region: Secondary | ICD-10-CM | POA: Diagnosis not present

## 2023-05-05 DIAGNOSIS — M9901 Segmental and somatic dysfunction of cervical region: Secondary | ICD-10-CM | POA: Diagnosis not present

## 2023-05-06 NOTE — Progress Notes (Unsigned)
Cardiology Office Note:    Date:  05/08/2023   ID:  [name] [name], DOB [birth year], MRN [id number]  PCP:  Rodrigo Ran, MD  Cardiologist:  Little Ishikawa, MD  Electrophysiologist:  None   Referring MD: Rodrigo Ran, MD   Chief Complaint  Patient presents with   Congestive Heart Failure    History of Present Illness:    [name] [name] is a 72 y.o. female with a hx of an ICM with EF 25 to 35% in 2006 with recovery to normal systolic function now with chronic diastolic heart failure, tobacco use, hypertension, fibromyalgia who presents for initial clinic visit.  She previously followed with Dr. Gala Romney in the advanced heart failure clinic but was referred to general cardiology given resolution of her systolic heart failure.  Underwent a left heart catheterization in 2006, which showed no coronary disease.  Stress echocardiogram in 2014 was normal.  Echocardiogram 01/2016 showed LVEF 55%, grade 1 diastolic dysfunction.  Echocardiogram 10/18/2020 showed normal biventricular function, grade 1 diastolic dysfunction, no significant valvular disease.  Echocardiogram 08/2022 showed EF 60 to 65%, grade 1 diastolic dysfunction, normal RV function, no significant valvular disease.  Since last clinic visit, she reports she is doing okay.  She is under a lot of stress right now as her son recently took in 2 young children to foster snd she is helping out. Denies any chest pain, lightheadedness, syncope, lower extremity edema, or palpitations.  She is in hiking club, hikes once per week, will hike for miles.   Wt Readings from Last 3 Encounters:  05/08/23 125 lb 12.8 oz (57.1 kg)  10/31/22 126 lb (57.2 kg)  08/13/22 126 lb (57.2 kg)    BP Readings from Last 3 Encounters:  05/08/23 (!) 162/64  10/31/22 110/60  08/13/22 132/78      Past Medical History:  Diagnosis Date   Allergy    Arthritis    Chronic kidney disease    Congestive heart failure (HCC)    kidney   Fibromyalgia     Fibromyalgia    Hearing loss of both ears    History of syncope    Hypertension    Lung nodules    Migraines    controlled with meds; down to once or twice yearly   Osteoporosis     Past Surgical History:  Procedure Laterality Date   BACK SURGERY  1986   ruptured disc   BREAST CYST ASPIRATION Right    BREAST SURGERY Right 1973   benign tumor   CATARACT EXTRACTION Bilateral    COLONOSCOPY     fibromas removed     HYSTEROSCOPY  04/1997   with polypectomy   TONSILLECTOMY     1960   TUBAL LIGATION      Current Medications: Current Meds  Medication Sig   acetaminophen (TYLENOL) 500 MG tablet Take 1,000 mg by mouth at bedtime. Takes PRN   amLODipine (NORVASC) 2.5 MG tablet TAKE 1 TABLET DAILY, TAKE ALONG WITH 5 MG TO EQUAL 7.5 MG   amLODipine (NORVASC) 5 MG tablet Take 1 tablet (5 mg total) by mouth daily. Take along with 2.5 mg equal 7.5 mg   aspirin 81 MG chewable tablet Chew 81 mg by mouth at bedtime.   b complex vitamins capsule Take 1 capsule by mouth daily.   BIOTIN PO Take 5,000 mg by mouth daily.   buPROPion (WELLBUTRIN SR) 150 MG 12 hr tablet Take 150 mg by mouth daily.    calcium carbonate (OS-CAL)  600 MG TABS Take 600 mg by mouth daily.   carvedilol (COREG) 12.5 MG tablet Take 1 tablet (12.5 mg total) by mouth 2 (two) times daily with a meal.   Cinnamon 500 MG TABS Take 500 mg by mouth daily.   Coenzyme Q10 (CO Q 10) 100 MG CAPS Take 100 mg by mouth 2 (two) times daily.   cyclobenzaprine (FLEXERIL) 10 MG tablet Take 10 mg by mouth at bedtime.   diphenhydrAMINE (BENADRYL) 25 mg capsule Take 25 mg by mouth at bedtime as needed.   docusate sodium (COLACE) 100 MG capsule Take 100 mg by mouth daily.   furosemide (LASIX) 20 MG tablet Take 1 tablet (20 mg total) by mouth every other day.   gabapentin (NEURONTIN) 100 MG capsule Take 200 mg by mouth at bedtime.    Hypromellose (ARTIFICIAL TEARS OP) Place 1 drop into both eyes daily as needed (dry eyes).   Melatonin ER 5 MG  TBCR Take by mouth.   Multiple Vitamin (MULTIVITAMIN) tablet Take 1 tablet by mouth daily.   Naproxen Sodium (ALEVE PO) Take by mouth daily as needed.   potassium chloride SA (KLOR-CON M) 20 MEQ tablet Take 1 tablet (20 mEq total) by mouth every other day.   pravastatin (PRAVACHOL) 20 MG tablet Take 20 mg 2 times a week on Sun and Wed   pravastatin (PRAVACHOL) 40 MG tablet Take 1 tablet 5 days a week   Triamcinolone Acetonide (NASACORT AQ NA) Place into the nose daily as needed.   TURMERIC PO Take 1 capsule by mouth daily.    Vitamin E (E 400 BLENDED PO) Take 400 mg by mouth 4 (four) times a week.   zolpidem (AMBIEN) 5 MG tablet Take 5 mg by mouth at bedtime as needed for sleep.     Allergies:   Citrus, Alendronate, Ezetimibe, Lactose intolerance (gi), Pseudoephedrine, Rosuvastatin, Silicon, Simvastatin, Sulfa antibiotics, and Escitalopram oxalate   Social History   Socioeconomic History   Marital status: Widowed    Spouse name: Not on file   Number of children: 3   Years of education: Not on file   Highest education level: High school graduate  Occupational History   Occupation: Airline pilot REP    Employer: [employer]  Tobacco Use   Smoking status: Former    Current packs/day: 0.00    Types: Cigarettes    Quit date: 12/20/2014    Years since quitting: 8.3   Smokeless tobacco: Never   Tobacco comments:    2 packs per week  Vaping Use   Vaping status: Never Used  Substance and Sexual Activity   Alcohol use: No   Drug use: No   Sexual activity: Not Currently    Partners: Male    Birth control/protection: Post-menopausal  Other Topics Concern   Not on file  Social History Narrative   Lives at home with her husband   Left handed   Drinks about 2 cups of caffeine daily   Social Determinants of Health   Financial Resource Strain: Not on file  Food Insecurity: Not on file  Transportation Needs: Not on file  Physical Activity: Not on file  Stress: Not on file   Social Connections: Not on file     Family History: The patient's family history includes Hypertension in her father and mother. There is no history of Colon cancer, Esophageal cancer, Stomach cancer, or Rectal cancer.  ROS:   Please see the history of present illness.  All other systems reviewed and  are negative.  EKGs/Labs/Other Studies Reviewed:    The following studies were reviewed today:   EKG:   08/13/2022: Normal sinus rhythm, rate 90, less than 1 mm ST depressions in leads II, aVL, I, T wave inversions in leads V5-6  05/08/2023: Normal sinus rhythm, rate 93, less than 1 mm ST depressions in inferior leads and V5-6    Recent Labs: 08/13/2022: ALT 19; BNP 2.8; Hemoglobin 13.6; Platelets 311 10/31/2022: BUN 16; Creatinine, Ser 1.03; Magnesium 1.9; Potassium 4.6; Sodium 145  Recent Lipid Panel    Component Value Date/Time   CHOL 219 (H) 10/31/2022 1444   TRIG 219 (H) 10/31/2022 1444   TRIG 110 10/21/2008 0000   HDL 62 10/31/2022 1444   CHOLHDL 3.5 10/31/2022 1444   LDLCALC 119 (H) 10/31/2022 1444    Physical Exam:    VS:  BP (!) 162/64 (BP Location: Left Arm, Patient Position: Sitting, Cuff Size: Normal)   Pulse 91   Ht 5' (1.524 m)   Wt 125 lb 12.8 oz (57.1 kg)   LMP 01/22/2001 (Approximate)   SpO2 97%   BMI 24.57 kg/m     Wt Readings from Last 3 Encounters:  05/08/23 125 lb 12.8 oz (57.1 kg)  10/31/22 126 lb (57.2 kg)  08/13/22 126 lb (57.2 kg)     GEN: Well nourished, well developed in no acute distress HEENT: Normal NECK: No JVD; No carotid bruits LYMPHATICS: No lymphadenopathy CARDIAC: RRR, no murmurs, rubs, gallops RESPIRATORY:  Clear to auscultation without rales, wheezing or rhonchi  ABDOMEN: Soft, non-tender, non-distended MUSCULOSKELETAL:  No edema; No deformity  SKIN: Warm and dry NEUROLOGIC:  Alert and oriented x 3 PSYCHIATRIC:  Normal affect   ASSESSMENT:    1. Heart failure with recovered ejection fraction (HFrecEF) (HCC)   2.  Essential hypertension, benign   3. Hyperlipidemia, unspecified hyperlipidemia type      PLAN:    Heart Failure with recovered EF- Previous NICM.  LHC 2006 with normal cors. EF recovered in 2014 ~55%.  Echocardiogram 10/18/2020 showed normal biventricular function, grade 1 diastolic dysfunction, no significant valvular disease.  Echocardiogram 08/2022 showed EF 60 to 65%, grade 1 diastolic dysfunction, normal RV function, no significant valvular disease. -Continue Lasix 20 mg every other day.  Check BMET, magnesium -Continue coreg 12.5 mg BID   HTN - Continue carvedilol 12.5 mg twice daily and amlodipine 7.5 mg a day.  BP 162/64 on initial check in clinic today, was 136/70 on recheck.  Asked to check BP twice daily for next week and let us know results  HLD -On pravastatin 20 mg daily.  Calcium score 62 on 12/29/20 (63rd percentile).  LDL 100 on 01/07/22.  Did not tolerate simvastatin or atorvastatin.  Started on Zetia 10 mg daily, but did not tolerate.  Will check lipid panel   Former Smoker - quit in 2016  RTC in 6 months   Medication Adjustments/Labs and Tests Ordered: Current medicines are reviewed at length with the patient today.  Concerns regarding medicines are outlined above.  Orders Placed This Encounter  Procedures   Basic Metabolic Panel (BMET)   Magnesium   Lipid panel   EKG 12-Lead   No orders of the defined types were placed in this encounter.   Patient Instructions  Medication Instructions:  Continue current medications *If you need a refill on your cardiac medications before your next appointment, please call your pharmacy*   Lab Work: BMET, MG, Lipid today If you have labs (blood work) drawn today  and your tests are completely normal, you will receive your results only by: MyChart Message (if you have MyChart) OR A paper copy in the mail If you have any lab test that is abnormal or we need to change your treatment, we will call you to review the  results.   Testing/Procedures: none   Follow-Up: At [facility], you and your health needs are our priority.  As part of our continuing mission to provide you with exceptional heart care, we have created designated Provider Care Teams.  These Care Teams include your primary Cardiologist (physician) and Advanced Practice Providers (APPs -  Physician Assistants and Nurse Practitioners) who all work together to provide you with the care you need, when you need it.  We recommend signing up for the patient portal called "MyChart".  Sign up information is provided on this After Visit Summary.  MyChart is used to connect with patients for Virtual Visits (Telemedicine).  Patients are able to view lab/test results, encounter notes, upcoming appointments, etc.  Non-urgent messages can be sent to your provider as well.   To learn more about what you can do with MyChart, go to [email or web address].    Your next appointment:   6 month(s)  Provider:   Little Ishikawa, MD     Other Instructions Please check blood pressure twice a day and send via my chart     Signed, Little Ishikawa, MD  05/08/2023 2:50 PM    [facility]

## 2023-05-08 ENCOUNTER — Ambulatory Visit: Payer: Medicare Other | Attending: Cardiology | Admitting: Cardiology

## 2023-05-08 ENCOUNTER — Encounter: Payer: Self-pay | Admitting: Cardiology

## 2023-05-08 VITALS — BP 162/64 | HR 91 | Ht 60.0 in | Wt 125.8 lb

## 2023-05-08 DIAGNOSIS — I5032 Chronic diastolic (congestive) heart failure: Secondary | ICD-10-CM

## 2023-05-08 DIAGNOSIS — E785 Hyperlipidemia, unspecified: Secondary | ICD-10-CM | POA: Diagnosis not present

## 2023-05-08 DIAGNOSIS — I10 Essential (primary) hypertension: Secondary | ICD-10-CM | POA: Diagnosis not present

## 2023-05-08 NOTE — Patient Instructions (Signed)
Medication Instructions:  Continue current medications *If you need a refill on your cardiac medications before your next appointment, please call your pharmacy*   Lab Work: BMET, MG, Lipid today If you have labs (blood work) drawn today and your tests are completely normal, you will receive your results only by: MyChart Message (if you have MyChart) OR A paper copy in the mail If you have any lab test that is abnormal or we need to change your treatment, we will call you to review the results.   Testing/Procedures: none   Follow-Up: At [facility], you and your health needs are our priority.  As part of our continuing mission to provide you with exceptional heart care, we have created designated Provider Care Teams.  These Care Teams include your primary Cardiologist (physician) and Advanced Practice Providers (APPs -  Physician Assistants and Nurse Practitioners) who all work together to provide you with the care you need, when you need it.  We recommend signing up for the patient portal called "MyChart".  Sign up information is provided on this After Visit Summary.  MyChart is used to connect with patients for Virtual Visits (Telemedicine).  Patients are able to view lab/test results, encounter notes, upcoming appointments, etc.  Non-urgent messages can be sent to your provider as well.   To learn more about what you can do with MyChart, go to [email or web address].    Your next appointment:   6 month(s)  Provider:   Little Ishikawa, MD     Other Instructions Please check blood pressure twice a day and send via my chart

## 2023-05-09 LAB — MAGNESIUM: Magnesium: 2 mg/dL (ref 1.6–2.3)

## 2023-05-09 LAB — BASIC METABOLIC PANEL
BUN/Creatinine Ratio: 23 (ref 12–28)
BUN: 21 mg/dL (ref 8–27)
CO2: 24 mmol/L (ref 20–29)
Calcium: 9.6 mg/dL (ref 8.7–10.3)
Chloride: 104 mmol/L (ref 96–106)
Creatinine, Ser: 0.92 mg/dL (ref 0.57–1.00)
Glucose: 102 mg/dL — ABNORMAL HIGH (ref 70–99)
Potassium: 4.3 mmol/L (ref 3.5–5.2)
Sodium: 143 mmol/L (ref 134–144)
eGFR: 66 mL/min/{1.73_m2} (ref >59)

## 2023-05-09 LAB — LIPID PANEL
Chol/HDL Ratio: 2.8 ratio (ref 0.0–4.4)
Cholesterol, Total: 168 mg/dL (ref 100–199)
HDL: 59 mg/dL (ref >39)
LDL Chol Calc (NIH): 89 mg/dL (ref 0–99)
Triglycerides: 115 mg/dL (ref 0–149)
VLDL Cholesterol Cal: 20 mg/dL (ref 5–40)

## 2023-05-15 DIAGNOSIS — M25511 Pain in right shoulder: Secondary | ICD-10-CM | POA: Diagnosis not present

## 2023-06-23 DIAGNOSIS — L03011 Cellulitis of right finger: Secondary | ICD-10-CM | POA: Diagnosis not present

## 2023-07-10 ENCOUNTER — Other Ambulatory Visit: Payer: Self-pay | Admitting: Internal Medicine

## 2023-07-10 DIAGNOSIS — Z1231 Encounter for screening mammogram for malignant neoplasm of breast: Secondary | ICD-10-CM

## 2023-07-16 DIAGNOSIS — M9901 Segmental and somatic dysfunction of cervical region: Secondary | ICD-10-CM | POA: Diagnosis not present

## 2023-07-16 DIAGNOSIS — M5411 Radiculopathy, occipito-atlanto-axial region: Secondary | ICD-10-CM | POA: Diagnosis not present

## 2023-08-04 DIAGNOSIS — M5411 Radiculopathy, occipito-atlanto-axial region: Secondary | ICD-10-CM | POA: Diagnosis not present

## 2023-08-04 DIAGNOSIS — M9901 Segmental and somatic dysfunction of cervical region: Secondary | ICD-10-CM | POA: Diagnosis not present

## 2023-08-25 ENCOUNTER — Ambulatory Visit
Admission: RE | Admit: 2023-08-25 | Discharge: 2023-08-25 | Disposition: A | Discharge status: Home / Self Care | Payer: Medicare Other | Source: Ambulatory Visit | Attending: Internal Medicine | Admitting: Internal Medicine

## 2023-08-25 DIAGNOSIS — Z1231 Encounter for screening mammogram for malignant neoplasm of breast: Secondary | ICD-10-CM

## 2023-08-25 DIAGNOSIS — M9901 Segmental and somatic dysfunction of cervical region: Secondary | ICD-10-CM | POA: Diagnosis not present

## 2023-08-25 DIAGNOSIS — M5411 Radiculopathy, occipito-atlanto-axial region: Secondary | ICD-10-CM | POA: Diagnosis not present

## 2023-09-01 DIAGNOSIS — H52203 Unspecified astigmatism, bilateral: Secondary | ICD-10-CM | POA: Diagnosis not present

## 2023-09-01 DIAGNOSIS — Z961 Presence of intraocular lens: Secondary | ICD-10-CM | POA: Diagnosis not present

## 2023-09-01 DIAGNOSIS — H04123 Dry eye syndrome of bilateral lacrimal glands: Secondary | ICD-10-CM | POA: Diagnosis not present

## 2023-09-15 DIAGNOSIS — M9901 Segmental and somatic dysfunction of cervical region: Secondary | ICD-10-CM | POA: Diagnosis not present

## 2023-09-15 DIAGNOSIS — M5411 Radiculopathy, occipito-atlanto-axial region: Secondary | ICD-10-CM | POA: Diagnosis not present

## 2023-09-25 ENCOUNTER — Ambulatory Visit: Admitting: Neurology

## 2023-09-25 ENCOUNTER — Encounter: Payer: Self-pay | Admitting: Neurology

## 2023-09-25 VITALS — BP 135/64 | HR 75 | Ht 60.0 in | Wt 120.8 lb

## 2023-09-25 DIAGNOSIS — G4734 Idiopathic sleep related nonobstructive alveolar hypoventilation: Secondary | ICD-10-CM

## 2023-09-25 DIAGNOSIS — G4719 Other hypersomnia: Secondary | ICD-10-CM

## 2023-09-25 DIAGNOSIS — R0683 Snoring: Secondary | ICD-10-CM

## 2023-09-25 DIAGNOSIS — Z820 Family history of epilepsy and other diseases of the nervous system: Secondary | ICD-10-CM

## 2023-09-25 DIAGNOSIS — Z9189 Other specified personal risk factors, not elsewhere classified: Secondary | ICD-10-CM | POA: Diagnosis not present

## 2023-09-25 DIAGNOSIS — I420 Dilated cardiomyopathy: Secondary | ICD-10-CM | POA: Diagnosis not present

## 2023-09-25 DIAGNOSIS — I5022 Chronic systolic (congestive) heart failure: Secondary | ICD-10-CM | POA: Diagnosis not present

## 2023-09-25 NOTE — Patient Instructions (Signed)

## 2023-09-25 NOTE — Progress Notes (Addendum)
 Subjective:    Patient ID: [name] [name] is a 73 y.o. female.  HPI    [name] Fairy, MD, PhD [facility] [address], [address] [address] [place], [place] [address]   Dear Dr. Genelle Kennedy,   I saw your patient, [name] [name], upon your kind request in my sleep clinic today for initial consultation of her sleep disorder, in particular, concern for underlying obstructive sleep apnea.  The patient is unaccompanied today.  As you know, [name] is a 73 year old female with an underlying medical history of congestive heart failure, dilated cardiomyopathy, hyperlipidemia, hypertension, chronic kidney disease, allergies, arthritis, fibromyalgia, recurrent headaches, history of [name] (previously followed by my colleague, Dr. Tresia Fruit), reflux disease, osteoporosis, and hearing loss, who reports snoring and excessive daytime somnolence, as well as oxygen drops while asleep.  She has noted on her smart watch that her oxygen levels may drop into the 70s during sleep.  She has not had any formal testing done such as a home sleep test or overnight pulse oximetry test through your office.  Her Epworth sleepiness score is 5 out of 24, fatigue severity score is 34 out of 63.  She reports that her youngest son has sleep apnea.  She has 3 grown sons.  She is widowed and lives alone.  She quit smoking in 2016.  She drinks caffeine in the form of coffee, usually 1 cup in the morning and occasional tea or soda but not daily.  She drinks alcohol occasionally up to once a week but has utilized alcohol in the evening to help her sleep.  Of note, she takes quite a few potentially sedating medications and also sleep aids including Benadryl 25 mg at bedtime, melatonin 5 mg nightly, she takes Flexeril 10 mg at bedtime, gabapentin 200 mg at bedtime, and she takes Ambien 5 mg at bedtime as needed.  She does not have a history of bruxism.  She has a TV in her bedroom but it is not on at night.  She was diagnosed  with congestive heart failure in 2006.  I reviewed your office note from 03/05/2023.  She denies any nightly nocturia or recurrent morning headaches.  Her Past Medical History Is Significant For: Past Medical History:  Diagnosis Date   Allergy    Arthritis    Chronic kidney disease    Congestive heart failure (HCC)    kidney   Fibromyalgia    Fibromyalgia    Hearing loss of both ears    History of syncope    Hypertension    Lung nodules    Migraines    controlled with meds; down to once or twice yearly   Osteoporosis     Her Past Surgical History Is Significant For: Past Surgical History:  Procedure Laterality Date   BACK SURGERY  1986   ruptured disc   BREAST CYST ASPIRATION Right    BREAST SURGERY Right 1973   benign tumor   CATARACT EXTRACTION Bilateral    COLONOSCOPY     fibromas removed     HYSTEROSCOPY  04/1997   with polypectomy   TONSILLECTOMY     1960   TUBAL LIGATION      Her Family History Is Significant For: Family History  Problem Relation Age of Onset   Hypertension Mother    Hypertension Father    Colon cancer Neg Hx    Esophageal cancer Neg Hx    Stomach cancer Neg Hx    Rectal cancer Neg  Hx    BRCA 1/2 Neg Hx    Breast cancer Neg Hx     Her Social History Is Significant For: Social History   Socioeconomic History   Marital status: Widowed    Spouse name: Not on file   Number of children: 3   Years of education: Not on file   Highest education level: High school graduate  Occupational History   Occupation: Airline pilot REP    Employer: [employer]  Tobacco Use   Smoking status: Former    Current packs/day: 0.00    Types: Cigarettes    Quit date: 12/20/2014    Years since quitting: 8.7   Smokeless tobacco: Never   Tobacco comments:    2 packs per week  Vaping Use   Vaping status: Never Used  Substance and Sexual Activity   Alcohol use: No   Drug use: No   Sexual activity: Not Currently    Partners: Male    Birth  control/protection: Post-menopausal  Other Topics Concern   Not on file  Social History Narrative   Lives at home with her husband   Left handed   Drinks about 2 cups of caffeine daily   Social Drivers of Corporate investment banker Strain: Not on file  Food Insecurity: Not on file  Transportation Needs: Not on file  Physical Activity: Not on file  Stress: Not on file  Social Connections: Not on file    Her Allergies Are:  Allergies  Allergen Reactions   Citrus Other (See Comments)    MIGRAINES (no citric acid, either)   Alendronate     Other Reaction(s): stomach upset   Ezetimibe     Other Reaction(s): achiness   Lactose Intolerance (Gi)     N and V, BP drops   Pseudoephedrine     Other Reaction(s): heart races   Rosuvastatin     Other Reaction(s): ache all over.   Silicon Swelling   Simvastatin     Other Reaction(s): "felt like had flu"   Sulfa Antibiotics Nausea And Vomiting   Escitalopram Oxalate Other (See Comments)    Flu-like symptoms without fever  :   Her Current Medications Are:  Outpatient Encounter Medications as of 09/25/2023  Medication Sig   acetaminophen (TYLENOL) 500 MG tablet Take 1,000 mg by mouth at bedtime. Takes PRN   amLODipine (NORVASC) 2.5 MG tablet TAKE 1 TABLET DAILY, TAKE ALONG WITH 5 MG TO EQUAL 7.5 MG   amLODipine (NORVASC) 5 MG tablet Take 1 tablet (5 mg total) by mouth daily. Take along with 2.5 mg equal 7.5 mg   aspirin 81 MG chewable tablet Chew 81 mg by mouth at bedtime.   b complex vitamins capsule Take 1 capsule by mouth daily.   BIOTIN PO Take 5,000 mg by mouth daily.   buPROPion (WELLBUTRIN SR) 150 MG 12 hr tablet Take 150 mg by mouth daily.    calcium carbonate (OS-CAL) 600 MG TABS Take 600 mg by mouth daily.   carvedilol (COREG) 12.5 MG tablet Take 1 tablet (12.5 mg total) by mouth 2 (two) times daily with a meal.   Cinnamon 500 MG TABS Take 500 mg by mouth daily.   Coenzyme Q10 (CO Q 10) 100 MG CAPS Take 100 mg by mouth 2  (two) times daily.   cyclobenzaprine (FLEXERIL) 10 MG tablet Take 10 mg by mouth at bedtime.   diphenhydrAMINE (BENADRYL) 25 mg capsule Take 25 mg by mouth at bedtime as needed.  docusate sodium (COLACE) 100 MG capsule Take 100 mg by mouth daily.   furosemide (LASIX) 20 MG tablet Take 1 tablet (20 mg total) by mouth every other day.   gabapentin (NEURONTIN) 100 MG capsule Take 200 mg by mouth at bedtime.    Hypromellose (ARTIFICIAL TEARS OP) Place 1 drop into both eyes daily as needed (dry eyes).   Melatonin ER 5 MG TBCR Take by mouth.   Multiple Vitamin (MULTIVITAMIN) tablet Take 1 tablet by mouth daily.   Naproxen Sodium (ALEVE PO) Take by mouth daily as needed.   potassium chloride SA (KLOR-CON M) 20 MEQ tablet Take 1 tablet (20 mEq total) by mouth every other day.   pravastatin (PRAVACHOL) 20 MG tablet Take 20 mg 2 times a week on Sun and Wed   pravastatin (PRAVACHOL) 40 MG tablet Take 1 tablet 5 days a week   Triamcinolone Acetonide (NASACORT AQ NA) Place into the nose daily as needed.   TURMERIC PO Take 1 capsule by mouth daily.    Vitamin E (E 400 BLENDED PO) Take 400 mg by mouth 4 (four) times a week.   zolpidem (AMBIEN) 5 MG tablet Take 5 mg by mouth at bedtime as needed for sleep.   icosapent Ethyl (VASCEPA) 1 g capsule Take 2 capsules (2 g total) by mouth 2 (two) times daily.   No facility-administered encounter medications on file as of 09/25/2023.  :   Review of Systems:  Out of a complete 14 point review of systems, all are reviewed and negative with the exception of these symptoms as listed below:  Review of Systems  Neurological:        Room 5 Pt is here Alone. Pt states that at night when she is asleep her O2 is at 70 or 90. Pt states she has trouble going to sleep.     Objective:  Neurological Exam  Physical Exam Physical Examination:   Vitals:   09/25/23 1525  BP: 135/64  Pulse: 75  SpO2: 97%    General Examination: The patient is a very pleasant 73 y.o.  female in no acute distress. She appears well-developed and well-nourished and well groomed.   HEENT: Normocephalic, atraumatic, pupils are equal, round and reactive to light, extraocular tracking is good without limitation to gaze excursion or nystagmus noted. Hearing is grossly intact with hearing aids in place. Face is mildly asymmetric. Speech is clear with no dysarthria noted. There is no hypophonia. There is no lip, neck/head, jaw or voice tremor. Neck is supple with full range of passive and active motion. There are no carotid bruits on auscultation. Oropharynx exam reveals: mild mouth dryness, adequate dental hygiene and mild airway crowding, due to small airway entry.  Tonsils are absent.  She has a slight malocclusion, no significant overbite.  Tongue protrudes centrally and palate elevates symmetrically, longer uvula noted, Mallampati class II.  Neck circumference 13-1/2 inches.  Chest: Clear to auscultation without wheezing, rhonchi or crackles noted.  Heart: S1+S2+0, regular and normal without murmurs, rubs or gallops noted.   Abdomen: Soft, non-tender and non-distended.  Extremities: There is no pitting edema in the distal lower extremities bilaterally.   Skin: Warm and dry without trophic changes noted.   Musculoskeletal: exam reveals no obvious joint deformities.   Neurologically:  Mental status: The patient is awake, alert and oriented in all 4 spheres. Her immediate and remote memory, attention, language skills and fund of knowledge are appropriate. There is no evidence of aphasia, agnosia, apraxia or anomia.  Speech is clear with normal prosody and enunciation. Thought process is linear. Mood is normal and affect is normal.  Cranial nerves II - XII are as described above under HEENT exam.  Motor exam: Normal bulk, strength and tone is noted. There is no obvious action or resting tremor.  Fine motor skills and coordination: grossly intact.  Cerebellar testing: No dysmetria or  intention tremor. There is no truncal or gait ataxia.  Sensory exam: intact to light touch in the upper and lower extremities.  Gait, station and balance: She stands easily. No veering to one side is noted. No leaning to one side is noted. Posture is age-appropriate and stance is narrow based. Gait shows normal stride length and normal pace. No problems turning are noted.   Assessment and Plan:  In summary, JANILA ARRAZOLA is a very pleasant 73 y.o.-year old female with an underlying medical history of congestive heart failure, dilated cardiomyopathy, hyperlipidemia, hypertension, chronic kidney disease, allergies, arthritis, fibromyalgia, recurrent headaches, history of [name] (previously followed by my colleague, Dr. Tresia Fruit), reflux disease, osteoporosis, and hearing loss, whose history and physical exam are concerning for sleep disordered breathing, particularly obstructive sleep apnea (OSA). While a laboratory attended sleep study is typically considered "gold standard" for evaluation of sleep disordered breathing, the patient would prefer a home sleep test at this time.   I had a long chat with the patient about my findings and the diagnosis of sleep apnea, particularly OSA, its prognosis and treatment options. We talked about medical/conservative treatments, surgical interventions and non-pharmacological approaches for symptom control. I explained, in particular, the risks and ramifications of untreated moderate to severe OSA, especially with respect to developing cardiovascular disease down the road, including congestive heart failure (CHF), difficult to treat hypertension, cardiac arrhythmias (particularly A-fib), neurovascular complications including TIA, stroke and dementia. Even type 2 diabetes has, in part, been linked to untreated OSA. Symptoms of untreated OSA may include (but may not be limited to) daytime sleepiness, nocturia (i.e. frequent nighttime urination), memory problems, mood  irritability and suboptimally controlled or worsening mood disorder such as depression and/or anxiety, lack of energy, lack of motivation, physical discomfort, as well as recurrent headaches, especially morning or nocturnal headaches. We talked about the importance of maintaining a healthy lifestyle and striving for healthy weight. In addition, we talked about the importance of striving for and maintaining good sleep hygiene.  She is cautioned regarding the use of sedating medications and the combination of multiple medications.  She is advised not to utilize Benadryl every night.  Furthermore, she is advised not to utilize any alcohol especially in the evening, especially at bedtime, and strongly advised not to utilize alcohol as a sleep aid. I recommended a sleep study at this time. I outlined the differences between a laboratory attended sleep study which is considered more comprehensive and accurate over the option of a home sleep test (HST); the latter may lead to underestimation of sleep disordered breathing in some instances and does not help with diagnosing upper airway resistance syndrome and is not accurate enough to diagnose primary central sleep apnea typically. I outlined possible surgical and non-surgical treatment options of OSA, including the use of a positive airway pressure (PAP) device (i.e. CPAP, AutoPAP/APAP or BiPAP in certain circumstances), a custom-made dental device (aka oral appliance, which would require a referral to a specialist dentist or orthodontist typically, and is generally speaking not considered for patients with full dentures or edentulous state), upper airway surgical options, such as  traditional UPPP (which is not considered a first-line treatment) or the Inspire device (hypoglossal nerve stimulator, which would involve a referral for consultation with an ENT surgeon, after careful selection, following inclusion criteria - also not first-line treatment). I explained the PAP  treatment option to the patient in detail, as this is generally considered first-line treatment.  The patient indicated that she would be willing to try PAP therapy, if the need arises. I explained the importance of being compliant with PAP treatment, not only for insurance purposes but primarily to improve patient's symptoms symptoms, and for the patient's long term health benefit, including to reduce Her cardiovascular risks longer-term.    We will pick up our discussion about the next steps and treatment options after testing.  We will keep her posted as to the test results by phone call and/or MyChart messaging where possible.  We will plan to follow-up in sleep clinic accordingly as well.  I answered all her questions today and the patient was in agreement.   I encouraged her to call with any interim questions, concerns, problems or updates or email us  through MyChart.  Generally speaking, sleep test authorizations may take up to 2 weeks, sometimes less, sometimes longer, the patient is encouraged to get in touch with us  if they do not hear back from the sleep lab staff directly within the next 2 weeks.  Thank you very much for allowing me to participate in the care of this nice patient. If I can be of any further assistance to you please do not hesitate to call me at [phone].  Sincerely,   [name] Fairy, MD, PhD

## 2023-10-06 DIAGNOSIS — M5411 Radiculopathy, occipito-atlanto-axial region: Secondary | ICD-10-CM | POA: Diagnosis not present

## 2023-10-06 DIAGNOSIS — M9901 Segmental and somatic dysfunction of cervical region: Secondary | ICD-10-CM | POA: Diagnosis not present

## 2023-10-07 ENCOUNTER — Ambulatory Visit (INDEPENDENT_AMBULATORY_CARE_PROVIDER_SITE_OTHER): Admitting: Neurology

## 2023-10-07 DIAGNOSIS — G4733 Obstructive sleep apnea (adult) (pediatric): Secondary | ICD-10-CM | POA: Diagnosis not present

## 2023-10-07 DIAGNOSIS — G4734 Idiopathic sleep related nonobstructive alveolar hypoventilation: Secondary | ICD-10-CM

## 2023-10-07 DIAGNOSIS — R0683 Snoring: Secondary | ICD-10-CM

## 2023-10-07 DIAGNOSIS — G4719 Other hypersomnia: Secondary | ICD-10-CM

## 2023-10-07 DIAGNOSIS — I5022 Chronic systolic (congestive) heart failure: Secondary | ICD-10-CM

## 2023-10-07 DIAGNOSIS — I420 Dilated cardiomyopathy: Secondary | ICD-10-CM

## 2023-10-07 DIAGNOSIS — Z820 Family history of epilepsy and other diseases of the nervous system: Secondary | ICD-10-CM

## 2023-10-07 DIAGNOSIS — Z9189 Other specified personal risk factors, not elsewhere classified: Secondary | ICD-10-CM

## 2023-10-08 NOTE — Progress Notes (Unsigned)
 Marland Kitchen

## 2023-10-09 NOTE — Procedures (Signed)
   [facility]  HOME SLEEP TEST (Watch PAT) REPORT  STUDY DATE: 10/08/2023  DOB: 1950-11-11  MRN: [id number]  ORDERING CLINICIAN: Huston Foley, MD, PhD   REFERRING CLINICIAN: Rodrigo Ran, MD   CLINICAL INFORMATION/HISTORY: 73 year old female with an underlying medical history of congestive heart failure, dilated cardiomyopathy, hyperlipidemia, hypertension, chronic kidney disease, allergies, arthritis, fibromyalgia, recurrent headaches, history of [name], reflux disease, osteoporosis, and hearing loss, who reports snoring and excessive daytime somnolence, as well as oxygen drops while asleep.    Epworth sleepiness score: 5/24.  BMI: 23.6 kg/m  FINDINGS:   Sleep Summary:   Total Recording Time (hours, min): 7 hours, 27 min  Total Sleep Time (hours, min):  6 hours, 50 min  Percent REM (%):    27.4%   Respiratory Indices:   Calculated pAHI (per hour):  6.4/hour         REM pAHI:    15.8/hour       NREM pAHI: 2.9/hour  Central pAHI: 0/hour  Oxygen Saturation Statistics:    Oxygen Saturation (%) Mean: 94%   Minimum oxygen saturation (%):                 80%   O2 Saturation Range (%): 80 -98%    O2 Saturation (minutes) <=88%: 2.5 min  Pulse Rate Statistics:   Pulse Mean (bpm):    65/min    Pulse Range (56-84/min)   IMPRESSION: OSA (obstructive sleep apnea), mild   RECOMMENDATION:  This home sleep test demonstrates overall mild obstructive sleep apnea with a total AHI of 6.4/hour and O2 nadir of 80%. Snoring was detected, ranging from mild to louder. Given the patient's medical history and sleep related complaints, therapy with a positive airway pressure device is recommended in the form of home AutoPap therapy.  A full night, in-lab PAP titration study may aid in improving proper treatment settings and with mask fit, if needed, down the road. Alternative treatments may include weight loss (where appropriate) along with avoidance of the  supine sleep position (if possible), or an oral appliance in appropriate candidates.   Please note that untreated obstructive sleep apnea may carry additional perioperative morbidity. Patients with significant obstructive sleep apnea should receive perioperative PAP therapy and the surgeons and particularly the anesthesiologist should be informed of the diagnosis and the severity of the sleep disordered breathing. The patient should be cautioned not to drive, work at heights, or operate dangerous or heavy equipment when tired or sleepy. Review and reiteration of good sleep hygiene measures should be pursued with any patient. Other causes of the patient's symptoms, including circadian rhythm disturbances, an underlying mood disorder, medication effect and/or an underlying medical problem cannot be ruled out based on this test. Clinical correlation is recommended.  The patient and her referring provider will be notified of the test results. The patient will be seen in follow up in sleep clinic at [facility], as necessary.  I certify that I have reviewed the raw data recording prior to the issuance of this report in accordance with the standards of the American Academy of Sleep Medicine (AASM).    INTERPRETING PHYSICIAN:   Huston Foley, MD, PhD Medical Director, [facility]) Diplomat, ABPN (Neurology and Sleep)   [facility] [address] [place], [place] [address] [phone]

## 2023-10-09 NOTE — Addendum Note (Signed)
 Addended by: Veatrice Eckstein on: 10/09/2023 05:34 PM   Modules accepted: Orders

## 2023-10-15 ENCOUNTER — Telehealth: Payer: Self-pay | Admitting: *Deleted

## 2023-10-15 NOTE — Telephone Encounter (Signed)
-----   Message from [name] sent at 10/09/2023  5:34 PM EDT ----- Patient referred by PCP, seen by me on 09/25/2023, patient had HST on 10/08/2023.    Please call and notify the patient that the recent home sleep test showed obstructive sleep apnea. OSA is overall mild, but worth treating to see if she feels better after treatment. To that end I recommend treatment for this in the form of autoPAP, which means, that we don't have to bring her in for a sleep study with CPAP, but will let her try an autoPAP machine at home, through a DME company (of her choice, or as per insurance requirement). The DME representative will educate her on how to use the machine, how to put the mask on, etc. I have placed an order in the chart. Please send referral, talk to patient, send report to referring MD. We will need a FU in sleep clinic in about 2.-3 months post-PAP set up (which is usually an insurance-mandated appointment to monitor compliance), please arrange that with me or one of our NPs. Please also go over the need for compliance with treatment (including the insurance-imposed minimum compliance percentage). Thanks,   [name], MD, PhD [facility])

## 2023-10-15 NOTE — Telephone Encounter (Signed)
 I called [name] [name] and relayed results of her sleep study. Mild OSA.  Recommended autopap by Dr. Omar Bibber .  Use 4+ more hrs per night. Sent order to [facility] they will call her re: authorization thru insurance.  Once started will call to make appt 2-3 months after starting use.   She verbalized understanding of plan.

## 2023-10-16 NOTE — Telephone Encounter (Signed)
 Zott, Nicky Barrack, RN; Josefine Nice Got it Thank you       Previous Messages    ----- Message ----- From: Viktoria Gray, RN Sent: 10/15/2023   2:23 PM EDT To: Russel Courser Zott Subject: new autopap user                              New autopap user,order in EPIC.  Katrine Parody. Meriwether "Pronounced Eustace Highland Female, 73 y.o., November 22, 1950 MRN: [id number] Phone: [phone] (M)   Thanks  [place]

## 2023-10-27 DIAGNOSIS — M9901 Segmental and somatic dysfunction of cervical region: Secondary | ICD-10-CM | POA: Diagnosis not present

## 2023-10-27 DIAGNOSIS — M5411 Radiculopathy, occipito-atlanto-axial region: Secondary | ICD-10-CM | POA: Diagnosis not present

## 2023-11-10 ENCOUNTER — Other Ambulatory Visit: Payer: Self-pay | Admitting: Cardiology

## 2023-11-13 DIAGNOSIS — M9901 Segmental and somatic dysfunction of cervical region: Secondary | ICD-10-CM | POA: Diagnosis not present

## 2023-11-13 DIAGNOSIS — M5411 Radiculopathy, occipito-atlanto-axial region: Secondary | ICD-10-CM | POA: Diagnosis not present

## 2023-11-20 ENCOUNTER — Telehealth: Payer: Self-pay | Admitting: Adult Health

## 2023-11-20 NOTE — Telephone Encounter (Signed)
 Pt called to schedule appt .  Appt Scheduled

## 2023-11-27 DIAGNOSIS — N390 Urinary tract infection, site not specified: Secondary | ICD-10-CM | POA: Diagnosis not present

## 2023-11-27 DIAGNOSIS — R8290 Unspecified abnormal findings in urine: Secondary | ICD-10-CM | POA: Diagnosis not present

## 2023-11-28 ENCOUNTER — Ambulatory Visit: Admitting: Adult Health

## 2023-12-09 DIAGNOSIS — M5411 Radiculopathy, occipito-atlanto-axial region: Secondary | ICD-10-CM | POA: Diagnosis not present

## 2023-12-09 DIAGNOSIS — M9901 Segmental and somatic dysfunction of cervical region: Secondary | ICD-10-CM | POA: Diagnosis not present

## 2023-12-23 DIAGNOSIS — M9901 Segmental and somatic dysfunction of cervical region: Secondary | ICD-10-CM | POA: Diagnosis not present

## 2023-12-23 DIAGNOSIS — M5411 Radiculopathy, occipito-atlanto-axial region: Secondary | ICD-10-CM | POA: Diagnosis not present

## 2023-12-23 NOTE — Progress Notes (Addendum)
 [name]: [name] [name] Date of Birth: 1951/01/11  Reason for Visit: Follow up History from: [name] Primary Neurologist: Buck  ASSESSMENT AND PLAN 73 y.o. year old female  Mild OSA on CPAP  -Continue using CPAP nightly, I will increase pressure range from 5-11 to 5-13 cm water, AHI is 6.5, has not noted any subjective benefit. We will see how [name] [name] does on this new pressure range. I would recommend [name] [name] continue to use for 6 months and if still not change, then we can discuss discontinuing CPAP. Consider weight loss, avoiding supine sleep, oral device. We may check ONO in [name] future as well, [name] [name] is still monitoring [name] oxygen levels on [name] smart watch still show some readings in [name] 80%.  I will pull a download in 6 weeks and reevaluate [name] data.  [name] [name] will follow-up in 6 months.  [name] [name] can see myself or Dr. Buck.  Addendum 01/12/24 SS: I called [name] [name], reports doing better with higher pressure range 5-13 cm water. Tolerating better, feeling more rested. Pleased with CPAP changes, motivated to continue. New download shows 83% compliance > 4 hours, AHI 6.1, leak 8.9. Will continue to use, keep us  updated if needed. Sleeping better and more rested with changes.   HISTORY OF PRESENT ILLNESS: Today 12/24/23 Saw Dr. Buck April 2025 reporting snoring and excessive daytime somnolence.  Noted [name] oxygen levels dropping on [name] smart watch.  HST 10/08/2023 showing overall mild OSA total AHI 6.4/hour., recommended trial of CPAP. Using CPAP, tried 3 different masks, finally found one that doesn't smother [name]. Using nasal pillow mask. [name] [name] was mostly concerned about [name] oxygen levels dropping to 70-90% on [name] watch pre CPAP. [name] [name] reports it is still dropping at night, but last night was 80-90%. Only sleeps about 5 hours at night, doesn't nap during [name] day, feels good energy during [name] day. Bowls twice a week, does all [name] own yard work. Setup CPAP 10/30/23. In [name] past knows [name] [name] has history of snoring, would wake  herself up. [name] [name] doesn't feel [name] sleep quality has improved, actually it has deteriorated since [name] [name] can't do [name] yoga breathing exercises with [name] CPAP head equipment.   HISTORY  09/25/23 SS: I saw your [name], Jackson Fetters, upon your kind request in my sleep clinic today for initial consultation of [name] sleep disorder, in particular, concern for underlying obstructive sleep apnea.  [name] [name] is unaccompanied today.  As you know, [name] [name] is a 73 year old female with an underlying medical history of congestive heart failure, dilated cardiomyopathy, hyperlipidemia, hypertension, chronic kidney disease, allergies, arthritis, fibromyalgia, recurrent headaches, history of [name] (previously followed by my colleague, Dr. Ines), reflux disease, osteoporosis, and hearing loss, who reports snoring and excessive daytime somnolence, as well as oxygen drops while asleep.  [name] [name] has noted on [name] smart watch that [name] oxygen levels may drop into [name] 70s during sleep.  [name] [name] has not had any formal testing done such as a home sleep test or overnight pulse oximetry test through your office.  [name] Epworth sleepiness score is 5 out of 24, fatigue severity score is 34 out of 63.  [name] [name] reports that [name] youngest son has sleep apnea.  [name] [name] has 3 grown sons.  [name] [name] is widowed and lives alone.  [name] [name] quit smoking in 2016.  [name] [name] drinks caffeine in [name] form of coffee, usually 1 cup in [name] morning and occasional tea or soda but not daily.  [name] [name] drinks alcohol  occasionally up to once a week but has  utilized alcohol  in [name] evening to help [name] sleep.  Of note, [name] [name] takes quite a few potentially sedating medications and also sleep aids including Benadryl 25 mg at bedtime, melatonin 5 mg nightly, [name] [name] takes Flexeril 10 mg at bedtime, gabapentin  200 mg at bedtime, and [name] [name] takes Ambien  5 mg at bedtime as needed.  [name] [name] does not have a history of bruxism.  [name] [name] has a TV in [name] bedroom but it is not on at night.  [name] [name] was diagnosed with congestive heart failure in  2006.  I reviewed your office note from 03/05/2023.  [name] [name] denies any nightly nocturia or recurrent morning headaches   REVIEW OF SYSTEMS: Out of a complete 14 system review of symptoms, [name] [name] complains only of [name] following symptoms, and all other reviewed systems are negative.  See HPI  ALLERGIES: Allergies  Allergen Reactions   Citrus Other (See Comments)    MIGRAINES (no citric acid, either)   Alendronate     Other Reaction(s): stomach upset   Ezetimibe      Other Reaction(s): achiness   Lactose Intolerance (Gi)     N and V, BP drops   Pseudoephedrine     Other Reaction(s): heart races   Rosuvastatin     Other Reaction(s): ache all over.   Silicon Swelling   Simvastatin     Other Reaction(s): felt like had flu   Sulfa Antibiotics Nausea And Vomiting   Escitalopram Oxalate Other (See Comments)    Flu-like symptoms without fever    HOME MEDICATIONS: Outpatient Medications Prior to Visit  Medication Sig Dispense Refill   acetaminophen  (TYLENOL ) 500 MG tablet Take 1,000 mg by mouth at bedtime. Takes PRN     amLODipine  (NORVASC ) 2.5 MG tablet TAKE 1 TABLET DAILY, TAKE ALONG WITH 5 MG TO EQUAL 7.5 MG 90 tablet 1   amLODipine  (NORVASC ) 5 MG tablet Take 1 tablet (5 mg total) by mouth daily. Take along with 2.5 mg equal 7.5 mg 90 tablet 1   aspirin  81 MG chewable tablet Chew 81 mg by mouth at bedtime.     b complex vitamins capsule Take 1 capsule by mouth daily.     BIOTIN PO Take 5,000 mg by mouth daily.     buPROPion  (WELLBUTRIN  SR) 150 MG 12 hr tablet Take 150 mg by mouth daily.      calcium  carbonate (OS-CAL) 600 MG TABS Take 600 mg by mouth daily.     carvedilol  (COREG ) 12.5 MG tablet Take 1 tablet (12.5 mg total) by mouth 2 (two) times daily with a meal. 60 tablet 0   Cinnamon  500 MG TABS Take 500 mg by mouth daily.     Coenzyme Q10 (CO Q 10) 100 MG CAPS Take 100 mg by mouth 2 (two) times daily.     cyclobenzaprine (FLEXERIL) 10 MG tablet Take 10 mg by mouth at  bedtime.     diphenhydrAMINE (BENADRYL) 25 mg capsule Take 25 mg by mouth at bedtime as needed.     docusate sodium  (COLACE) 100 MG capsule Take 100 mg by mouth daily.     furosemide  (LASIX ) 20 MG tablet TAKE 1 TABLET EVERY OTHER DAY 45 tablet 1   gabapentin  (NEURONTIN ) 100 MG capsule Take 200 mg by mouth at bedtime.   11   Hypromellose (ARTIFICIAL TEARS OP) Place 1 drop into both eyes daily as needed (dry eyes).     Melatonin ER 5 MG TBCR Take by mouth.     Multiple Vitamin (MULTIVITAMIN) tablet Take 1  tablet by mouth daily.     Naproxen Sodium (ALEVE PO) Take by mouth daily as needed.     potassium chloride  SA (KLOR-CON  M20) 20 MEQ tablet TAKE 1 TABLET EVERY OTHER DAY 45 tablet 1   pravastatin  (PRAVACHOL ) 20 MG tablet Take 20 mg 2 times a week on Sun and Wed 90 tablet 3   pravastatin  (PRAVACHOL ) 40 MG tablet Take 1 tablet 5 days a week 90 tablet 3   Triamcinolone Acetonide (NASACORT AQ NA) Place into [name] nose daily as needed.     TURMERIC PO Take 1 capsule by mouth daily.      Vitamin E (E 400 BLENDED PO) Take 400 mg by mouth 4 (four) times a week.     zolpidem  (AMBIEN ) 5 MG tablet Take 5 mg by mouth at bedtime as needed for sleep.     icosapent  Ethyl (VASCEPA ) 1 g capsule Take 2 capsules (2 g total) by mouth 2 (two) times daily. 360 capsule 3   No facility-administered medications prior to visit.    PAST MEDICAL HISTORY: Past Medical History:  Diagnosis Date   Allergy    Arthritis    Chronic kidney disease    Congestive heart failure (HCC)    kidney   Fibromyalgia    Fibromyalgia    Hearing loss of both ears    History of syncope    Hypertension    Lung nodules    Migraines    controlled with meds; down to once or twice yearly   Osteoporosis     PAST SURGICAL HISTORY: Past Surgical History:  Procedure Laterality Date   BACK SURGERY  1986   ruptured disc   BREAST CYST ASPIRATION Right    BREAST SURGERY Right 1973   benign tumor   CATARACT EXTRACTION Bilateral     COLONOSCOPY     fibromas removed     HYSTEROSCOPY  04/1997   with polypectomy   TONSILLECTOMY     1960   TUBAL LIGATION      FAMILY HISTORY: Family History  Problem Relation Age of Onset   Hypertension Mother    Hypertension Father    Colon cancer Neg Hx    Esophageal cancer Neg Hx    Stomach cancer Neg Hx    Rectal cancer Neg Hx    BRCA 1/2 Neg Hx    Breast cancer Neg Hx     SOCIAL HISTORY: Social History   Socioeconomic History   Marital status: Widowed    Spouse name: Not on file   Number of children: 3   Years of education: Not on file   Highest education level: High school graduate  Occupational History   Occupation: Airline pilot REP    Employer: [employer]  Tobacco Use   Smoking status: Former    Current packs/day: 0.00    Types: Cigarettes    Quit date: 12/20/2014    Years since quitting: 9.0   Smokeless tobacco: Never   Tobacco comments:    2 packs per week  Vaping Use   Vaping status: Never Used  Substance and Sexual Activity   Alcohol  use: No   Drug use: No   Sexual activity: Not Currently    Partners: Male    Birth control/protection: Post-menopausal  Other Topics Concern   Not on file  Social History Narrative   Lives at home with [name] husband   Left handed   Drinks about 2 cups of caffeine daily   Social Drivers of Health  Financial Resource Strain: Not on file  Food Insecurity: Not on file  Transportation Needs: Not on file  Physical Activity: Not on file  Stress: Not on file  Social Connections: Not on file  Intimate Partner Violence: Not on file    PHYSICAL EXAM  Vitals:   12/24/23 0926  BP: 115/62  Pulse: 70  Weight: 121 lb (54.9 kg)  Height: 5' (1.524 m)   Body mass index is 23.63 kg/m.  Generalized: Well developed, in no acute distress  Neurological examination  Mentation: Alert oriented to time, place, history taking. Follows all commands speech and language fluent Cranial nerve II-XII: Pupils were equal round  reactive to light. Extraocular movements were full, visual field were full on confrontational test. Facial sensation and strength were normal.  Head turning and shoulder shrug  were normal and symmetric. Motor: [name] motor testing reveals 5 over 5 strength of all 4 extremities.  Gait and station: Gait is normal.   DIAGNOSTIC DATA (LABS, IMAGING, TESTING) - I reviewed [name] records, labs, notes, testing and imaging myself where available.  Lab Results  Component Value Date   WBC 8.7 08/13/2022   HGB 13.6 08/13/2022   HCT 39.9 08/13/2022   MCV 96 08/13/2022   PLT 311 08/13/2022      Component Value Date/Time   NA 143 05/08/2023 1507   K 4.3 05/08/2023 1507   CL 104 05/08/2023 1507   CO2 24 05/08/2023 1507   GLUCOSE 102 (H) 05/08/2023 1507   GLUCOSE 135 (H) 03/03/2017 1128   BUN 21 05/08/2023 1507   CREATININE 0.92 05/08/2023 1507   CALCIUM  9.6 05/08/2023 1507   PROT 6.2 08/13/2022 1637   ALBUMIN 4.6 08/13/2022 1637   AST 20 08/13/2022 1637   ALT 19 08/13/2022 1637   ALKPHOS 100 08/13/2022 1637   BILITOT <0.2 08/13/2022 1637   GFRNONAA >60 03/03/2017 1115   GFRAA >60 03/03/2017 1115   Lab Results  Component Value Date   CHOL 168 05/08/2023   HDL 59 05/08/2023   LDLCALC 89 05/08/2023   TRIG 115 05/08/2023   CHOLHDL 2.8 05/08/2023   No results found for: HGBA1C No results found for: VITAMINB12 No results found for: TSH  Lauraine Born, AGNP-C, DNP 12/24/2023, 9:33 AM [facility] [address], [address] [place], [place] [address] [phone]

## 2023-12-24 ENCOUNTER — Encounter: Payer: Self-pay | Admitting: Neurology

## 2023-12-24 ENCOUNTER — Telehealth: Payer: Self-pay

## 2023-12-24 ENCOUNTER — Ambulatory Visit (INDEPENDENT_AMBULATORY_CARE_PROVIDER_SITE_OTHER): Admitting: Neurology

## 2023-12-24 VITALS — BP 115/62 | HR 70 | Ht 60.0 in | Wt 121.0 lb

## 2023-12-24 DIAGNOSIS — G4733 Obstructive sleep apnea (adult) (pediatric): Secondary | ICD-10-CM

## 2023-12-24 NOTE — Patient Instructions (Signed)
 I will increase your CPAP pressure range from 5-11 to 5-13 cm water to see if better control of apnea. I will pull a download in 6 weeks to re-evaluate the data. Try to use CPAP nightly for minimum 4 hours. Plan to try using for 6 months, and check back in.

## 2023-12-24 NOTE — Telephone Encounter (Signed)
 Community msg sent to DME [facility]  [phone]

## 2023-12-24 NOTE — Telephone Encounter (Signed)
-----   Message from Lauraine JINNY Born sent at 12/24/2023  9:57 AM EDT ----- Can you please send a community message to her DME, I went in [facility] and increase her pressure range 5-13 cm water. Thanks

## 2023-12-29 NOTE — Telephone Encounter (Signed)
 Pt called stating that [facility] called and informed her that the order that was sent in for her cpap machine is written in such a way that the insurance will not cover it. Pt states the order needs to be reworded. Please advise.

## 2023-12-30 NOTE — Telephone Encounter (Signed)
 Called and spoke to pt and stated that we sent new order and should be all good. Advised pt to let us  know if they need anything else

## 2023-12-30 NOTE — Telephone Encounter (Signed)
 Called and lvm for [name] [name] 1st attempt 12/30/23.   Need to ask who called and what specifically they told her

## 2023-12-30 NOTE — Telephone Encounter (Signed)
 Received msg from dme:

## 2023-12-30 NOTE — Telephone Encounter (Signed)
 I made order to reflect change by SS/NP on 12-24-2023 that she did in [place].   [facility] has order.

## 2023-12-30 NOTE — Telephone Encounter (Signed)
 As long you verify with DME that all is well, I don't think anything further is needed. I changed the pressure in res med myself, I didn't request anything from the DME. Thanks

## 2023-12-30 NOTE — Addendum Note (Signed)
 Addended by: NEYSA NENA RAMAN on: 12/30/2023 10:56 AM   Modules accepted: Orders

## 2023-12-30 NOTE — Telephone Encounter (Addendum)
 Pt has returned call to CMA, if pt does not hear from CMA within 45 mins she will try CMA tomorrow morning

## 2024-01-02 ENCOUNTER — Encounter: Payer: Self-pay | Admitting: Cardiology

## 2024-01-02 ENCOUNTER — Ambulatory Visit: Attending: Cardiology | Admitting: Cardiology

## 2024-01-02 VITALS — BP 124/80 | HR 78 | Ht 60.0 in | Wt 119.2 lb

## 2024-01-02 DIAGNOSIS — Z79899 Other long term (current) drug therapy: Secondary | ICD-10-CM | POA: Insufficient documentation

## 2024-01-02 DIAGNOSIS — I5032 Chronic diastolic (congestive) heart failure: Secondary | ICD-10-CM | POA: Diagnosis not present

## 2024-01-02 DIAGNOSIS — G4733 Obstructive sleep apnea (adult) (pediatric): Secondary | ICD-10-CM | POA: Insufficient documentation

## 2024-01-02 DIAGNOSIS — I10 Essential (primary) hypertension: Secondary | ICD-10-CM | POA: Diagnosis not present

## 2024-01-02 NOTE — Progress Notes (Signed)
 Cardiology Office Note:   Date:  01/02/2024  ID:  [name] [name], DOB 01/29/1951, MRN [id number] PCP: Shayne Anes, MD   HeartCare Providers Cardiologist:  Lonni LITTIE Nanas, MD    History of Present Illness:   Discussed the use of AI scribe software for clinical note transcription with the patient, who gave verbal consent to proceed.  History of Present Illness [name] [name] Pronounced Kevon is a 73 year old female with chronic diastolic heart failure, HFrecEF, hypertension, hyperlipidemia who presents for follow-up.  She has a history of ischemic cardiomyopathy with a previously reduced ejection fraction of 25-35% in 2006, which has since recovered to normal systolic function. Her last echocardiogram in March 2024 showed an LVEF of 60-65%. She experiences stable shortness of breath when hiking, though not with normal walking. She takes Lasix  20 mg every other day and Coreg  12.5 mg twice daily.  She has hypertension and is currently on amlodipine  7.5 mg and Coreg  12.5 mg twice daily. Her blood pressure readings have been stable, with recent measurements of 124/80 and 115/62. She does not regularly monitor her blood pressure at home.  She has sleep apnea and uses a CPAP machine with nasal pillows. Reports seeking care for sleep apnea after her watch noted nighttime hypoxia. Initially, she had difficulty adjusting, but after increasing the oxygen level, her sleep has improved. She reports better sleep in the past week.  She has a history of fibromyalgia and tobacco use. She is active, participating in a senior hiking group and bowling league, and maintains her yard with a push mower. She reports stable weight between 115-120 lbs and no significant swelling in her abdomen or legs.  She experienced significant social stress in the past due to fostering two young children, which has since resolved as the children are now in foster care (though admits to significant stress about their  wellbeing). She notes that the children made significant developmental progress during their time with her.  Studies Reviewed:    09/13/22 TTE  IMPRESSIONS     1. Left ventricular ejection fraction, by estimation, is 60 to 65%. The  left ventricle has normal function. The left ventricle has no regional  wall motion abnormalities. There is mild concentric left ventricular  hypertrophy. Left ventricular diastolic  parameters are consistent with Grade I diastolic dysfunction (impaired  relaxation).   2. Right ventricular systolic function is normal. The right ventricular  size is normal.   3. The mitral valve is normal in structure. Mild mitral valve  regurgitation. No evidence of mitral stenosis.   4. The aortic valve is tricuspid. Aortic valve regurgitation is not  visualized. No aortic stenosis is present.   5. The inferior vena cava is normal in size with greater than 50%  respiratory variability, suggesting right atrial pressure of 3 mmHg.   Risk Assessment/Calculations:              Physical Exam:   VS:  BP 124/80   Pulse 78   Ht 5' (1.524 m)   Wt 119 lb 3.2 oz (54.1 kg)   LMP 01/22/2001 (Approximate)   SpO2 98%   BMI 23.28 kg/m    Wt Readings from Last 3 Encounters:  01/02/24 119 lb 3.2 oz (54.1 kg)  12/24/23 121 lb (54.9 kg)  09/25/23 120 lb 12.8 oz (54.8 kg)     Physical Exam Vitals reviewed.  Constitutional:      Appearance: Normal appearance.  HENT:     Head: Normocephalic.  Nose: Nose normal.  Eyes:     Pupils: Pupils are equal, round, and reactive to light.  Cardiovascular:     Rate and Rhythm: Normal rate and regular rhythm.     Pulses: Normal pulses.     Heart sounds: Normal heart sounds. No murmur heard.    No friction rub. No gallop.  Pulmonary:     Effort: Pulmonary effort is normal.     Breath sounds: Normal breath sounds.  Abdominal:     General: Abdomen is flat.  Musculoskeletal:     Right lower leg: No edema.     Left lower leg:  No edema.  Skin:    General: Skin is warm and dry.     Capillary Refill: Capillary refill takes less than 2 seconds.  Neurological:     General: No focal deficit present.     Mental Status: She is alert and oriented to person, place, and time.  Psychiatric:        Mood and Affect: Mood normal.        Behavior: Behavior normal.        Thought Content: Thought content normal.        Judgment: Judgment normal.      ASSESSMENT AND PLAN:    Assessment & Plan Chronic diastolic heart failure/HFrecEF Chronic diastolic heart failure with well-managed symptoms (previously reduced LVEF, now recovered). Reports stable exertional dyspnea, particularly when hiking. No significant dyspnea when walking on flat ground. Weight stable on current diuretic regimen. No significant edema or orthopnea. Echocardiograms show normal biventricular function with grade one diastolic dysfunction. Clinically stable since last visit with no need for repeat echocardiogram or functional testing at this time. - Continue Lasix  20 mg every other day. - Continue Coreg  12.5mg  BID - Check metabolic panel and magnesium to monitor renal function and electrolytes.  Hypertension Hypertension well-controlled on current medication regimen. Blood pressure readings excellent, with recent measurements of 124/80 mmHg and 115/62 mmHg. Discussed potential need to deescalate medication if blood pressure improves further with treatment of sleep apnea. - Continue carvedilol  12.5 mg twice daily. - Continue amlodipine  7.5 mg daily. - Monitor for symptoms of hypotension such as dizziness or lightheadedness.  Hyperlipidemia Patient taking 40mg  Pravastatin  5 days a week, 20mg  other 2 days. Latest lipid panel shows LDL at goal 100mg /dL.  Lab Results  Component Value Date   CHOL 168 05/08/2023   HDL 59 05/08/2023   LDLCALC 89 05/08/2023   TRIG 115 05/08/2023   CHOLHDL 2.8 05/08/2023     Sleep apnea Sleep apnea managed with CPAP  therapy. Initially had difficulty adjusting but reports improved sleep quality after recent adjustment to oxygen levels. Acknowledged that treating sleep apnea can improve overall health, including blood pressure and heart failure symptoms. - Continue using CPAP with nasal pillow. - Monitor for improvement in symptoms and report any issues with CPAP use.   1 year follow up          Signed, Artist Pouch, PA-C

## 2024-01-02 NOTE — Patient Instructions (Signed)
 Medication Instructions:  Your physician recommends that you continue on your current medications as directed. Please refer to the Current Medication list given to you today.  *If you need a refill on your cardiac medications before your next appointment, please call your pharmacy*  Lab Work: TODAY:  BMET & MAG  If you have labs (blood work) drawn today and your tests are completely normal, you will receive your results only by: MyChart Message (if you have MyChart) OR A paper copy in the mail If you have any lab test that is abnormal or we need to change your treatment, we will call you to review the results.  Testing/Procedures: None ordered  Follow-Up: At [facility], you and your health needs are our priority.  As part of our continuing mission to provide you with exceptional heart care, our providers are all part of one team.  This team includes your primary Cardiologist (physician) and Advanced Practice Providers or APPs (Physician Assistants and Nurse Practitioners) who all work together to provide you with the care you need, when you need it.  Your next appointment:   1 year(s)  Provider:   Lonni LITTIE Nanas, MD    We recommend signing up for the patient portal called MyChart.  Sign up information is provided on this After Visit Summary.  MyChart is used to connect with patients for Virtual Visits (Telemedicine).  Patients are able to view lab/test results, encounter notes, upcoming appointments, etc.  Non-urgent messages can be sent to your provider as well.   To learn more about what you can do with MyChart, go to [email or web address].   Other Instructions

## 2024-01-03 LAB — BASIC METABOLIC PANEL WITH GFR
BUN/Creatinine Ratio: 13 (ref 12–28)
BUN: 12 mg/dL (ref 8–27)
CO2: 25 mmol/L (ref 20–29)
Calcium: 9.6 mg/dL (ref 8.7–10.3)
Chloride: 103 mmol/L (ref 96–106)
Creatinine, Ser: 0.91 mg/dL (ref 0.57–1.00)
Glucose: 124 mg/dL — ABNORMAL HIGH (ref 70–99)
Potassium: 4.8 mmol/L (ref 3.5–5.2)
Sodium: 144 mmol/L (ref 134–144)
eGFR: 66 mL/min/1.73 (ref >59)

## 2024-01-03 LAB — MAGNESIUM: Magnesium: 2 mg/dL (ref 1.6–2.3)

## 2024-01-05 ENCOUNTER — Ambulatory Visit: Payer: Self-pay | Admitting: Cardiology

## 2024-01-08 ENCOUNTER — Telehealth: Payer: Self-pay

## 2024-01-08 NOTE — Telephone Encounter (Signed)
 Patient [name] [name] on sleep lab phone needing to speak with someone about current DME not covered under insurance.

## 2024-01-08 NOTE — Telephone Encounter (Signed)
 Sent to [facility]:

## 2024-01-08 NOTE — Telephone Encounter (Signed)
 Sent msg to [facility] to cancel :

## 2024-01-12 DIAGNOSIS — M9901 Segmental and somatic dysfunction of cervical region: Secondary | ICD-10-CM | POA: Diagnosis not present

## 2024-01-12 DIAGNOSIS — M5411 Radiculopathy, occipito-atlanto-axial region: Secondary | ICD-10-CM | POA: Diagnosis not present

## 2024-02-02 DIAGNOSIS — M5411 Radiculopathy, occipito-atlanto-axial region: Secondary | ICD-10-CM | POA: Diagnosis not present

## 2024-02-02 DIAGNOSIS — M9901 Segmental and somatic dysfunction of cervical region: Secondary | ICD-10-CM | POA: Diagnosis not present

## 2024-02-04 ENCOUNTER — Other Ambulatory Visit: Payer: Self-pay | Admitting: Cardiology

## 2024-02-04 ENCOUNTER — Encounter: Payer: Self-pay | Admitting: Neurology

## 2024-02-05 DIAGNOSIS — N390 Urinary tract infection, site not specified: Secondary | ICD-10-CM | POA: Diagnosis not present

## 2024-02-05 DIAGNOSIS — R300 Dysuria: Secondary | ICD-10-CM | POA: Diagnosis not present

## 2024-02-05 DIAGNOSIS — R8290 Unspecified abnormal findings in urine: Secondary | ICD-10-CM | POA: Diagnosis not present

## 2024-02-24 DIAGNOSIS — M5411 Radiculopathy, occipito-atlanto-axial region: Secondary | ICD-10-CM | POA: Diagnosis not present

## 2024-02-24 DIAGNOSIS — M9901 Segmental and somatic dysfunction of cervical region: Secondary | ICD-10-CM | POA: Diagnosis not present

## 2024-03-09 DIAGNOSIS — I509 Heart failure, unspecified: Secondary | ICD-10-CM | POA: Diagnosis not present

## 2024-03-09 DIAGNOSIS — M5411 Radiculopathy, occipito-atlanto-axial region: Secondary | ICD-10-CM | POA: Diagnosis not present

## 2024-03-09 DIAGNOSIS — M9901 Segmental and somatic dysfunction of cervical region: Secondary | ICD-10-CM | POA: Diagnosis not present

## 2024-03-09 DIAGNOSIS — M810 Age-related osteoporosis without current pathological fracture: Secondary | ICD-10-CM | POA: Diagnosis not present

## 2024-03-09 DIAGNOSIS — Z1212 Encounter for screening for malignant neoplasm of rectum: Secondary | ICD-10-CM | POA: Diagnosis not present

## 2024-03-09 DIAGNOSIS — N182 Chronic kidney disease, stage 2 (mild): Secondary | ICD-10-CM | POA: Diagnosis not present

## 2024-03-09 DIAGNOSIS — R7301 Impaired fasting glucose: Secondary | ICD-10-CM | POA: Diagnosis not present

## 2024-03-09 DIAGNOSIS — K219 Gastro-esophageal reflux disease without esophagitis: Secondary | ICD-10-CM | POA: Diagnosis not present

## 2024-03-09 DIAGNOSIS — E785 Hyperlipidemia, unspecified: Secondary | ICD-10-CM | POA: Diagnosis not present

## 2024-03-09 DIAGNOSIS — I130 Hypertensive heart and chronic kidney disease with heart failure and stage 1 through stage 4 chronic kidney disease, or unspecified chronic kidney disease: Secondary | ICD-10-CM | POA: Diagnosis not present

## 2024-03-12 DIAGNOSIS — I509 Heart failure, unspecified: Secondary | ICD-10-CM | POA: Diagnosis not present

## 2024-03-12 DIAGNOSIS — H9190 Unspecified hearing loss, unspecified ear: Secondary | ICD-10-CM | POA: Diagnosis not present

## 2024-03-12 DIAGNOSIS — Z0000 Encounter for general adult medical examination without abnormal findings: Secondary | ICD-10-CM | POA: Diagnosis not present

## 2024-03-12 DIAGNOSIS — I700 Atherosclerosis of aorta: Secondary | ICD-10-CM | POA: Diagnosis not present

## 2024-03-12 DIAGNOSIS — I2510 Atherosclerotic heart disease of native coronary artery without angina pectoris: Secondary | ICD-10-CM | POA: Diagnosis not present

## 2024-03-12 DIAGNOSIS — G4733 Obstructive sleep apnea (adult) (pediatric): Secondary | ICD-10-CM | POA: Diagnosis not present

## 2024-03-12 DIAGNOSIS — M797 Fibromyalgia: Secondary | ICD-10-CM | POA: Diagnosis not present

## 2024-03-12 DIAGNOSIS — E785 Hyperlipidemia, unspecified: Secondary | ICD-10-CM | POA: Diagnosis not present

## 2024-03-12 DIAGNOSIS — I130 Hypertensive heart and chronic kidney disease with heart failure and stage 1 through stage 4 chronic kidney disease, or unspecified chronic kidney disease: Secondary | ICD-10-CM | POA: Diagnosis not present

## 2024-03-12 DIAGNOSIS — F329 Major depressive disorder, single episode, unspecified: Secondary | ICD-10-CM | POA: Diagnosis not present

## 2024-03-12 DIAGNOSIS — N1831 Chronic kidney disease, stage 3a: Secondary | ICD-10-CM | POA: Diagnosis not present

## 2024-03-12 DIAGNOSIS — M810 Age-related osteoporosis without current pathological fracture: Secondary | ICD-10-CM | POA: Diagnosis not present

## 2024-03-25 DIAGNOSIS — R82998 Other abnormal findings in urine: Secondary | ICD-10-CM | POA: Diagnosis not present

## 2024-03-29 DIAGNOSIS — M5411 Radiculopathy, occipito-atlanto-axial region: Secondary | ICD-10-CM | POA: Diagnosis not present

## 2024-03-29 DIAGNOSIS — M9901 Segmental and somatic dysfunction of cervical region: Secondary | ICD-10-CM | POA: Diagnosis not present

## 2024-04-14 ENCOUNTER — Telehealth: Payer: Self-pay | Admitting: *Deleted

## 2024-04-14 DIAGNOSIS — G4733 Obstructive sleep apnea (adult) (pediatric): Secondary | ICD-10-CM

## 2024-04-14 NOTE — Telephone Encounter (Signed)
 Called pt at [phone]. Relayed Sarah's recommendations. Pt agreeable to ONO and pressure change.   I changed pressure on [facility] (see below). Pt will let us  know if pressure change does not help.   Sent community message to [facility] that order placed for ONO. Aware they will reach out in the next week to get this scheduled/set up.   She reports she has mask as tight as she can get already. She will make sure she continues.

## 2024-04-14 NOTE — Telephone Encounter (Signed)
   Took call and spoke with pt. Smart watch was showing drop in oxygen levels and that was reason for sleep study. Feels CPAP/oxygen has helped. However, still feels she is not resting well.  Santina out of town and did not use CPAPx8 days. Felt no difference is when she uses CPAP.  Was out of town this past weekend and did not use for 3 nights. She tries to use at least 4 hours or more each night. She will wake up with CPAP off and unsure how.  Feels mask is fitting okay/uses nasal pillow. I reviewed data and relayed AHI 5.6 and showing no air leaks. Aware I will send to Lauraine to review/make recommendation and will call back.  DME: [facility]

## 2024-04-14 NOTE — Telephone Encounter (Signed)
 Check ONO.  CPAP data reviewed, 90% compliance, greater than 4 hours 60%, leak 7.2, AHI 5.6 (most are obstructive).  ONO 2. Tighten mask to ensure stays on at night or we can order mask refit 3. I have increased the pressure from 5-11 to 5-13 and she reported feeling better. We can go up to 5-14 and see how she feels?  Orders Placed This Encounter  Procedures   Pulse oximetry, overnight

## 2024-04-19 DIAGNOSIS — M9901 Segmental and somatic dysfunction of cervical region: Secondary | ICD-10-CM | POA: Diagnosis not present

## 2024-04-19 DIAGNOSIS — M5411 Radiculopathy, occipito-atlanto-axial region: Secondary | ICD-10-CM | POA: Diagnosis not present

## 2024-04-22 DIAGNOSIS — G4734 Idiopathic sleep related nonobstructive alveolar hypoventilation: Secondary | ICD-10-CM | POA: Diagnosis not present

## 2024-04-22 NOTE — Telephone Encounter (Signed)
 Faxed completed form below to [employer], received fax confirmation.

## 2024-04-27 ENCOUNTER — Encounter: Payer: Self-pay | Admitting: Neurology

## 2024-04-29 NOTE — Telephone Encounter (Signed)
 [name] [name]

## 2024-05-04 ENCOUNTER — Ambulatory Visit (INDEPENDENT_AMBULATORY_CARE_PROVIDER_SITE_OTHER): Admitting: Neurology

## 2024-05-04 ENCOUNTER — Encounter: Payer: Self-pay | Admitting: Neurology

## 2024-05-04 ENCOUNTER — Telehealth: Payer: Self-pay

## 2024-05-04 VITALS — BP 113/65 | HR 78 | Resp 15 | Ht 60.0 in | Wt 116.5 lb

## 2024-05-04 DIAGNOSIS — G4733 Obstructive sleep apnea (adult) (pediatric): Secondary | ICD-10-CM

## 2024-05-04 NOTE — Progress Notes (Signed)
 I reviewed the above note and documentation by the Nurse Practitioner and agree with the history, exam, assessment and plan as outlined above. I was available for consultation. [name], MD, PhD [facility])

## 2024-05-04 NOTE — Telephone Encounter (Signed)
 Order sent to dme to d/c cpap:

## 2024-05-04 NOTE — Progress Notes (Signed)
 Patient: [name] [name] Date of Birth: 04/08/1951  Reason for Visit: Follow up History from: Patient Primary Neurologist: Buck  ASSESSMENT AND PLAN 73 y.o. year old female  Mild OSA on CPAP  -Has not reported any subjective benefit with CPAP. In fact, feels worse with CPAP, not sleeping as well. Has tried multiple different masks, continues to pull off in her sleep.  We have tried adjusting the pressure without change in how she feels.  At this point, she wishes to discontinue CPAP.  I have offered an in-lab CPAP titration study as well as referral to dentistry to be fitted for an oral device.  She would like to discontinue her CPAP and monitor her symptoms.  Please reach back out if she notices further low oxygen readings on her watch, if snoring, feeling fatigued.  Of note, she has history of CHF several years ago but her EF has normalized.   - Initially referred to our office after reports of low oxygen readings on her watch - HST 10/08/2023 overall mild obstructive sleep apnea with a total AHI of 6.4/hour and O2 nadir of 80%. Snoring was detected, ranging from mild to louder - CPAP set up 10/30/2023 - ONO study completed 04/22/2024. Duration was 3 hours and 1 minute. Overall oxygen is not necessary. Time less than 88% was only 7 seconds. Time at 89% was only 33 seconds. Oxygen ranged from 88 to 97%. There were 0 artifact events.   HISTORY OF PRESENT ILLNESS: Today 05/04/24 05/04/24 SS: Here to discuss CPAP.  She would like to stop CPAP.  Since last visit in July I increased her pressure range from 5-11 to 5-13 for an AHI of 6.5 and not noting any subjective benefit with CPAP. 04/14/24 we changed pressure 5-14, patient had called reporting not resting well, felt no difference with CPAP vs without it. Waking up with CPAP off. Compliance CPAP 87%, > 4 hours 60%, 5-14 cm water, AHI 6.1, leak 9.4. according to her watch, her levels of oxygen were low, but hasn't been in the 1-2 months. Having  problems keeping CPAP on at night. Using nasal pillow mask.  The next day he taken out all you will realize it all 3 months.  Have you tried a different type of mask and you tried like a fullface have you tried other types of masks. With FFM felt she was smothering. Has not noted any difference with CPAP reportedly. Went on bus trip in September without her CPAP, felt fine without CPAP. Overnight pulse oximetry study completed 04/22/2024. Duration was 3 hours and 1 minute. Overall oxygen is not necessary. Time less than 88% was only 7 seconds. Time at 89% was only 33 seconds. Oxygen ranged from 88 to 97%. There were 0 artifact events. She is active, bowls twice a week, all her yard working, hiking. She lives alone, is not sure if she snores.   12/24/23 SS: Saw Dr. Buck April 2025 reporting snoring and excessive daytime somnolence.  Noted her oxygen levels dropping on her smart watch.  HST 10/08/2023 showing overall mild OSA total AHI 6.4/hour., recommended trial of CPAP. Using CPAP, tried 3 different masks, finally found one that doesn't smother her. Using nasal pillow mask. She was mostly concerned about her oxygen levels dropping to 70-90% on her watch pre CPAP. She reports it is still dropping at night, but last night was 80-90%. Only sleeps about 5 hours at night, doesn't nap during the day, feels good energy during the day. Bowls twice  a week, does all her own yard work. Setup CPAP 10/30/23. In the past knows she has history of snoring, would wake herself up. She doesn't feel her sleep quality has improved, actually it has deteriorated since she can't do her yoga breathing exercises with the CPAP head equipment.   HISTORY  09/25/23 SS: I saw your patient, [name] [name], upon your kind request in my sleep clinic today for initial consultation of her sleep disorder, in particular, concern for underlying obstructive sleep apnea.  The patient is unaccompanied today.  As you know, [name] [name] is a 73 year old female with an  underlying medical history of congestive heart failure, dilated cardiomyopathy, hyperlipidemia, hypertension, chronic kidney disease, allergies, arthritis, fibromyalgia, recurrent headaches, history of [name] (previously followed by my colleague, Dr. Ines), reflux disease, osteoporosis, and hearing loss, who reports snoring and excessive daytime somnolence, as well as oxygen drops while asleep.  She has noted on her smart watch that her oxygen levels may drop into the 70s during sleep.  She has not had any formal testing done such as a home sleep test or overnight pulse oximetry test through your office.  Her Epworth sleepiness score is 5 out of 24, fatigue severity score is 34 out of 63.  She reports that her youngest son has sleep apnea.  She has 3 grown sons.  She is widowed and lives alone.  She quit smoking in 2016.  She drinks caffeine in the form of coffee, usually 1 cup in the morning and occasional tea or soda but not daily.  She drinks alcohol  occasionally up to once a week but has utilized alcohol  in the evening to help her sleep.  Of note, she takes quite a few potentially sedating medications and also sleep aids including Benadryl 25 mg at bedtime, melatonin 5 mg nightly, she takes Flexeril 10 mg at bedtime, gabapentin  200 mg at bedtime, and she takes Ambien  5 mg at bedtime as needed.  She does not have a history of bruxism.  She has a TV in her bedroom but it is not on at night.  She was diagnosed with congestive heart failure in 2006.  I reviewed your office note from 03/05/2023.  She denies any nightly nocturia or recurrent morning headaches   REVIEW OF SYSTEMS: Out of a complete 14 system review of symptoms, the patient complains only of the following symptoms, and all other reviewed systems are negative.  See HPI  ALLERGIES: Allergies  Allergen Reactions   Citrus Other (See Comments)    MIGRAINES (no citric acid, either)   Alendronate     Other Reaction(s): stomach upset    Ezetimibe      Other Reaction(s): achiness   Lactose Intolerance (Gi)     N and V, BP drops   Pseudoephedrine     Other Reaction(s): heart races   Rosuvastatin     Other Reaction(s): ache all over.   Silicon Swelling   Simvastatin     Other Reaction(s): felt like had flu   Sulfa Antibiotics Nausea And Vomiting   Escitalopram Oxalate Other (See Comments)    Flu-like symptoms without fever    HOME MEDICATIONS: Outpatient Medications Prior to Visit  Medication Sig Dispense Refill   acetaminophen  (TYLENOL ) 500 MG tablet Take 1,000 mg by mouth at bedtime. Takes PRN     amLODipine  (NORVASC ) 2.5 MG tablet TAKE 1 TABLET DAILY, TAKE ALONG WITH 5 MG TO EQUAL 7.5 MG 90 tablet 1   amLODipine  (NORVASC ) 5 MG tablet TAKE 1  TABLET DAILY. TAKE ALONG WITH 2.5 MG TO EQUAL 7.5 MG 90 tablet 3   aspirin  81 MG chewable tablet Chew 81 mg by mouth at bedtime.     b complex vitamins capsule Take 1 capsule by mouth daily.     BIOTIN PO Take 5,000 mg by mouth daily.     buPROPion  (WELLBUTRIN  SR) 150 MG 12 hr tablet Take 150 mg by mouth daily.      calcium  carbonate (OS-CAL) 600 MG TABS Take 600 mg by mouth daily.     carvedilol  (COREG ) 12.5 MG tablet Take 1 tablet (12.5 mg total) by mouth 2 (two) times daily with a meal. 60 tablet 0   Cinnamon  500 MG TABS Take 500 mg by mouth daily.     Coenzyme Q10 (CO Q 10) 100 MG CAPS Take 100 mg by mouth 2 (two) times daily.     cyclobenzaprine (FLEXERIL) 10 MG tablet Take 10 mg by mouth at bedtime.     diphenhydrAMINE (BENADRYL) 25 mg capsule Take 25 mg by mouth at bedtime as needed.     docusate sodium  (COLACE) 100 MG capsule Take 100 mg by mouth daily.     furosemide  (LASIX ) 20 MG tablet TAKE 1 TABLET EVERY OTHER DAY 45 tablet 1   gabapentin  (NEURONTIN ) 100 MG capsule Take 200 mg by mouth at bedtime.   11   Hypromellose (ARTIFICIAL TEARS OP) Place 1 drop into both eyes daily as needed (dry eyes).     Melatonin ER 5 MG TBCR Take by mouth.     Multiple Vitamin  (MULTIVITAMIN) tablet Take 1 tablet by mouth daily.     Naproxen Sodium (ALEVE PO) Take by mouth daily as needed.     potassium chloride  SA (KLOR-CON  M20) 20 MEQ tablet TAKE 1 TABLET EVERY OTHER DAY 45 tablet 1   pravastatin  (PRAVACHOL ) 20 MG tablet TAKE 1 TABLET TWICE WEEKLY ON SUNDAYS AND WEDNESDAYS 26 tablet 3   pravastatin (PRAVACHOL) 40 MG tablet TAKE 1 TABLET 5 DAYS A WEEK 64 tablet 3   Triamcinolone Acetonide (NASACORT AQ NA) Place into the nose daily as needed.     TURMERIC PO Take 1 capsule by mouth daily.      Vitamin E (E 400 BLENDED PO) Take 400 mg by mouth 4 (four) times a week.     zolpidem (AMBIEN) 5 MG tablet Take 5 mg by mouth at bedtime as needed for sleep.     No facility-administered medications prior to visit.    PAST MEDICAL HISTORY: Past Medical History:  Diagnosis Date   Allergy    Arthritis    Chronic kidney disease    Congestive heart failure (HCC)    kidney   Fibromyalgia    Fibromyalgia    Hearing loss of both ears    History of syncope    Hypertension    Lung nodules    Migraines    controlled with meds; down to once or twice yearly   Osteoporosis     PAST SURGICAL HISTORY: Past Surgical History:  Procedure Laterality Date   BACK SURGERY  1986   ruptured disc   BREAST CYST ASPIRATION Right    BREAST SURGERY Right 1973   benign tumor   CATARACT EXTRACTION Bilateral    COLONOSCOPY     fibromas removed     HYSTEROSCOPY  04/1997   with polypectomy   TONSILLECTOMY     [age]   TUBAL LIGATION      FAMILY HISTORY: Family History  Problem Relation Age of Onset   Hypertension Mother    Hypertension Father    Colon cancer Neg Hx    Esophageal cancer Neg Hx    Stomach cancer Neg Hx    Rectal cancer Neg Hx    BRCA 1/2 Neg Hx    Breast cancer Neg Hx     SOCIAL HISTORY: Social History   Socioeconomic History   Marital status: Widowed    Spouse name: Not on file   Number of children: 3   Years of education: Not on file   Highest  education level: High school graduate  Occupational History   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  Tobacco Use   Smoking status: Former    Current packs/day: 0.00    Types: Cigarettes    Quit date: 12/20/2014    Years since quitting: 9.3   Smokeless tobacco: Never   Tobacco comments:    2 packs per week  Vaping Use   Vaping status: Never Used  Substance and Sexual Activity   Alcohol  use: No   Drug use: No   Sexual activity: Not Currently    Partners: Male    Birth control/protection: Post-menopausal  Other Topics Concern   Not on file  Social History Narrative   Lives at home with her husband   Left handed   Drinks about 2 cups of caffeine daily   Social Drivers of Corporate Investment Banker Strain: Not on file  Food Insecurity: Not on file  Transportation Needs: Not on file  Physical Activity: Not on file  Stress: Not on file  Social Connections: Not on file  Intimate Partner Violence: Not on file    PHYSICAL EXAM  There were no vitals filed for this visit.  There is no height or weight on file to calculate BMI.  Generalized: Well developed, in no acute distress  Neurological examination  Mentation: Alert oriented to time, place, history taking. Follows all commands speech and language fluent Cranial nerve II-XII: Pupils were equal round reactive to light. Extraocular movements were full, visual field were full on confrontational test. Facial sensation and strength were normal.  Head turning and shoulder shrug  were normal and symmetric. Motor: The motor testing reveals 5 over 5 strength of all 4 extremities.  Gait and station: Gait is normal.   DIAGNOSTIC DATA (LABS, IMAGING, TESTING) - I reviewed patient records, labs, notes, testing and imaging myself where available.  Lab Results  Component Value Date   WBC 8.7 08/13/2022   HGB 13.6 08/13/2022   HCT 39.9 08/13/2022   MCV 96 08/13/2022   PLT 311 08/13/2022      Component Value  Date/Time   NA 144 01/02/2024 1416   K 4.8 01/02/2024 1416   CL 103 01/02/2024 1416   CO2 25 01/02/2024 1416   GLUCOSE 124 (H) 01/02/2024 1416   GLUCOSE 135 (H) 03/03/2017 1128   BUN 12 01/02/2024 1416   CREATININE 0.91 01/02/2024 1416   CALCIUM  9.6 01/02/2024 1416   PROT 6.2 08/13/2022 1637   ALBUMIN 4.6 08/13/2022 1637   AST 20 08/13/2022 1637   ALT 19 08/13/2022 1637   ALKPHOS 100 08/13/2022 1637   BILITOT <0.2 08/13/2022 1637   GFRNONAA >60 03/03/2017 1115   GFRAA >60 03/03/2017 1115   Lab Results  Component Value Date   CHOL 168 05/08/2023   HDL 59 05/08/2023   LDLCALC 89 05/08/2023   TRIG 115 05/08/2023   CHOLHDL 2.8  05/08/2023   No results found for: HGBA1C No results found for: VITAMINB12 No results found for: TSH  Lauraine Born, AGNP-C, DNP 05/04/2024, 8:29 AM [facility] [address], [address] [place], [place] [address] [phone]

## 2024-05-04 NOTE — Telephone Encounter (Signed)
-----   Message from Lauraine JINNY Born sent at 05/04/2024  9:02 AM EST ----- Order to D/C CPAP

## 2024-05-04 NOTE — Patient Instructions (Signed)
 We will discontinue your CPAP.  Please reach out for any concerning symptoms such as fatigue, low oxygen levels, morning headache etc. avoid supine sleep.

## 2024-05-10 DIAGNOSIS — M5411 Radiculopathy, occipito-atlanto-axial region: Secondary | ICD-10-CM | POA: Diagnosis not present

## 2024-05-10 DIAGNOSIS — M9901 Segmental and somatic dysfunction of cervical region: Secondary | ICD-10-CM | POA: Diagnosis not present

## 2024-05-17 ENCOUNTER — Other Ambulatory Visit: Payer: Self-pay | Admitting: Cardiology

## 2024-05-18 ENCOUNTER — Encounter: Payer: Self-pay | Admitting: Neurology

## 2024-05-31 DIAGNOSIS — M5411 Radiculopathy, occipito-atlanto-axial region: Secondary | ICD-10-CM | POA: Diagnosis not present

## 2024-05-31 DIAGNOSIS — M9901 Segmental and somatic dysfunction of cervical region: Secondary | ICD-10-CM | POA: Diagnosis not present

## 2024-07-05 ENCOUNTER — Ambulatory Visit: Admitting: Neurology

## 2024-07-14 ENCOUNTER — Other Ambulatory Visit: Payer: Self-pay | Admitting: Internal Medicine

## 2024-07-14 DIAGNOSIS — Z1231 Encounter for screening mammogram for malignant neoplasm of breast: Secondary | ICD-10-CM

## 2024-08-27 ENCOUNTER — Ambulatory Visit
